# Patient Record
Sex: Female | Born: 1965 | Race: White | Hispanic: No | Marital: Single
Health system: Southern US, Academic
[De-identification: ages and names within clinical notes are randomized; demographics above are authoritative.]

## PROBLEM LIST (undated history)

## (undated) ENCOUNTER — Encounter

## (undated) ENCOUNTER — Ambulatory Visit

## (undated) DIAGNOSIS — G43109 Migraine with aura, not intractable, without status migrainosus: Secondary | ICD-10-CM

## (undated) DIAGNOSIS — D649 Anemia, unspecified: Secondary | ICD-10-CM

## (undated) DIAGNOSIS — M1990 Unspecified osteoarthritis, unspecified site: Secondary | ICD-10-CM

## (undated) DIAGNOSIS — J45909 Unspecified asthma, uncomplicated: Secondary | ICD-10-CM

## (undated) DIAGNOSIS — K219 Gastro-esophageal reflux disease without esophagitis: Secondary | ICD-10-CM

## (undated) DIAGNOSIS — T7840XA Allergy, unspecified, initial encounter: Secondary | ICD-10-CM

## (undated) DIAGNOSIS — Z87898 Personal history of other specified conditions: Secondary | ICD-10-CM

## (undated) DIAGNOSIS — Q796 Ehlers-Danlos syndrome, unspecified: Secondary | ICD-10-CM

## (undated) HISTORY — PX: SPINE SURGERY: SHX786

## (undated) HISTORY — DX: Anemia, unspecified: D64.9

## (undated) HISTORY — DX: Migraine with aura, not intractable, without status migrainosus: G43.109

## (undated) HISTORY — PX: UTERINE FIBROID SURGERY: SHX826

## (undated) HISTORY — DX: Gastro-esophageal reflux disease without esophagitis: K21.9

## (undated) HISTORY — DX: Unspecified asthma, uncomplicated: J45.909

## (undated) HISTORY — DX: Allergy, unspecified, initial encounter: T78.40XA

## (undated) HISTORY — DX: Personal history of other specified conditions: Z87.898

## (undated) HISTORY — DX: Ehlers-Danlos syndrome, unspecified: Q79.60

## (undated) HISTORY — DX: Unspecified osteoarthritis, unspecified site: M19.90

---

## 1998-04-17 ENCOUNTER — Encounter: Payer: Self-pay | Admitting: Internal Medicine

## 1998-04-17 ENCOUNTER — Ambulatory Visit (HOSPITAL_COMMUNITY): Admission: RE | Admit: 1998-04-17 | Discharge: 1998-04-17 | Payer: Self-pay | Admitting: Internal Medicine

## 1998-08-31 ENCOUNTER — Encounter: Payer: Self-pay | Admitting: Neurosurgery

## 1998-08-31 ENCOUNTER — Ambulatory Visit (HOSPITAL_COMMUNITY): Admission: RE | Admit: 1998-08-31 | Discharge: 1998-08-31 | Payer: Self-pay | Admitting: Neurosurgery

## 2000-07-01 ENCOUNTER — Ambulatory Visit (HOSPITAL_COMMUNITY): Admission: RE | Admit: 2000-07-01 | Discharge: 2000-07-01 | Payer: Self-pay | Admitting: Neurosurgery

## 2000-07-01 ENCOUNTER — Encounter: Payer: Self-pay | Admitting: Neurosurgery

## 2000-08-25 ENCOUNTER — Ambulatory Visit (HOSPITAL_COMMUNITY): Admission: RE | Admit: 2000-08-25 | Discharge: 2000-08-25 | Payer: Self-pay | Admitting: Internal Medicine

## 2000-08-25 ENCOUNTER — Encounter: Payer: Self-pay | Admitting: Internal Medicine

## 2000-08-25 ENCOUNTER — Encounter: Admission: RE | Admit: 2000-08-25 | Discharge: 2000-08-25 | Payer: Self-pay | Admitting: Internal Medicine

## 2000-09-05 ENCOUNTER — Encounter: Admission: RE | Admit: 2000-09-05 | Discharge: 2000-09-05 | Payer: Self-pay | Admitting: Internal Medicine

## 2000-10-05 ENCOUNTER — Encounter: Admission: RE | Admit: 2000-10-05 | Discharge: 2000-10-05 | Payer: Self-pay | Admitting: Internal Medicine

## 2000-11-07 ENCOUNTER — Encounter: Admission: RE | Admit: 2000-11-07 | Discharge: 2000-11-07 | Payer: Self-pay | Admitting: Internal Medicine

## 2001-12-21 ENCOUNTER — Encounter: Payer: Self-pay | Admitting: Family Medicine

## 2001-12-21 ENCOUNTER — Ambulatory Visit (HOSPITAL_COMMUNITY): Admission: RE | Admit: 2001-12-21 | Discharge: 2001-12-21 | Payer: Self-pay | Admitting: Family Medicine

## 2002-02-26 ENCOUNTER — Encounter: Admission: RE | Admit: 2002-02-26 | Discharge: 2002-02-26 | Payer: Self-pay | Admitting: Internal Medicine

## 2004-02-23 ENCOUNTER — Ambulatory Visit (HOSPITAL_COMMUNITY): Admission: RE | Admit: 2004-02-23 | Discharge: 2004-02-23 | Payer: Self-pay | Admitting: Gastroenterology

## 2004-09-06 ENCOUNTER — Ambulatory Visit (HOSPITAL_COMMUNITY): Admission: RE | Admit: 2004-09-06 | Discharge: 2004-09-06 | Payer: Self-pay | Admitting: Neurosurgery

## 2007-01-18 HISTORY — PX: SHOULDER SURGERY: SHX246

## 2007-03-27 ENCOUNTER — Encounter: Admission: RE | Admit: 2007-03-27 | Discharge: 2007-03-27 | Payer: Self-pay | Admitting: Gastroenterology

## 2007-06-10 ENCOUNTER — Encounter: Admission: RE | Admit: 2007-06-10 | Discharge: 2007-06-10 | Payer: Self-pay | Admitting: Neurosurgery

## 2009-06-05 ENCOUNTER — Encounter: Admission: RE | Admit: 2009-06-05 | Discharge: 2009-06-05 | Payer: Self-pay | Admitting: Neurosurgery

## 2010-01-17 HISTORY — PX: KNEE SURGERY: SHX244

## 2010-06-04 NOTE — Op Note (Signed)
NAME:  Marissa [name], Marissa [name]                  ACCOUNT NO.:  [id number]   MEDICAL RECORD NO.:  [id number]          PATIENT TYPE:  AMB   LOCATION:  ENDO                         FACILITY:  [facility]   PHYSICIAN:  Anselmo Rod, M.D.  DATE OF BIRTH:  03-Mar-1965   DATE OF PROCEDURE:  02/23/2004  DATE OF DISCHARGE:                                 OPERATIVE REPORT   PROCEDURE PERFORMED:  Screening colonoscopy.   ENDOSCOPIST:  Charna Elizabeth, M.D.   INSTRUMENT USED:  Olympus video colonoscope.   INDICATIONS FOR PROCEDURE:  A 45 year old white female with a history of  colonic polyps in her mother and colon cancer in her grandmother, undergoing  screening colonoscopy to rule out colonic polyps, masses, etc. 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   PREPROCEDURE PREPARATION:  Informed consent was procured from the patient.  The patient was fasted for eight hours prior to the procedure and prepped  with a bottle of magnesium citrate and a gallon of GoLYTELY the night prior  to the procedure.  The risks and benefits of the procedure including a 10%  miss rate for colon polyps or cancers was discussed with the patient as  well.   PREPROCEDURE PHYSICAL:  The patient had stable vital signs.  Neck supple.  Chest clear to auscultation.  S1 and S2 regular.  Abdomen soft with normal  bowel sounds.   DESCRIPTION OF PROCEDURE:  The patient was placed in left lateral decubitus  position and sedated with 70 mg of Demerol and 7 mg of Versed in slow  incremental doses.  Once the patient was adequately sedated and maintained  on low flow oxygen and continuous cardiac monitoring, the Olympus video  colonoscope was advanced from the rectum to the cecum.  The appendicular  orifice and ileocecal valve were clearly visualized and photographed.  The  terminal ileum appeared healthy and without lesions.  No masses, polyps,  erosions, ulcerations or diverticula were seen.  Retroflexion in the rectum  revealed no  abnormalities.   IMPRESSION:  Normal colonoscopy up to terminal ileum.  No masses or polyps  seen.  No evidence of diverticulosis.   RECOMMENDATIONS:  1.  Continue high fiber diet with liberal fluid intake.  2.  Repeat colonoscopy is recommended in the next five years unless the      patient develops any abnormal symptoms in the interim.  3.  Outpatient followup as need arises in the future.      JNM/MEDQ  D:  02/23/2004  T:  02/23/2004  Job:  [id number]   cc:   Nancy Marus __________, MD

## 2010-08-16 ENCOUNTER — Other Ambulatory Visit: Payer: Self-pay | Admitting: Family Medicine

## 2010-08-16 DIAGNOSIS — M542 Cervicalgia: Secondary | ICD-10-CM

## 2010-08-18 ENCOUNTER — Ambulatory Visit
Admission: RE | Admit: 2010-08-18 | Discharge: 2010-08-18 | Disposition: A | Discharge status: Home / Self Care | Payer: BC Managed Care – PPO | Source: Ambulatory Visit | Attending: Family Medicine | Admitting: Family Medicine

## 2010-08-18 DIAGNOSIS — M542 Cervicalgia: Secondary | ICD-10-CM

## 2010-08-18 MED ORDER — GADOBENATE DIMEGLUMINE 529 MG/ML IV SOLN
10.0000 mL | Freq: Once | INTRAVENOUS | Status: AC | PRN
  Administered 2010-08-18: 10 mL via INTRAVENOUS

## 2010-08-20 ENCOUNTER — Other Ambulatory Visit: Payer: Self-pay

## 2011-02-07 ENCOUNTER — Other Ambulatory Visit (HOSPITAL_COMMUNITY): Payer: Self-pay | Admitting: Family Medicine

## 2011-02-15 ENCOUNTER — Encounter (HOSPITAL_COMMUNITY)
Admission: RE | Admit: 2011-02-15 | Discharge: 2011-02-15 | Disposition: A | Discharge status: Home / Self Care | Payer: BC Managed Care – PPO | Source: Ambulatory Visit | Attending: Family Medicine | Admitting: Family Medicine

## 2011-02-15 DIAGNOSIS — R109 Unspecified abdominal pain: Secondary | ICD-10-CM | POA: Insufficient documentation

## 2011-02-15 MED ORDER — SINCALIDE 5 MCG IJ SOLR
INTRAMUSCULAR | Status: AC
  Administered 2011-02-15: 0.94 ug via INTRAVENOUS
  Filled 2011-02-15: qty 5

## 2011-02-15 MED ORDER — TECHNETIUM TC 99M MEBROFENIN IV KIT
5.0000 | PACK | Freq: Once | INTRAVENOUS | Status: AC | PRN
  Administered 2011-02-15: 5 via INTRAVENOUS

## 2011-06-23 ENCOUNTER — Other Ambulatory Visit: Payer: Self-pay | Admitting: Neurosurgery

## 2011-06-23 DIAGNOSIS — D352 Benign neoplasm of pituitary gland: Secondary | ICD-10-CM

## 2011-06-23 DIAGNOSIS — D353 Benign neoplasm of craniopharyngeal duct: Secondary | ICD-10-CM

## 2011-07-08 ENCOUNTER — Ambulatory Visit
Admission: RE | Admit: 2011-07-08 | Discharge: 2011-07-08 | Disposition: A | Discharge status: Home / Self Care | Payer: BC Managed Care – PPO | Source: Ambulatory Visit | Attending: Neurosurgery | Admitting: Neurosurgery

## 2011-07-08 DIAGNOSIS — D352 Benign neoplasm of pituitary gland: Secondary | ICD-10-CM

## 2011-07-08 DIAGNOSIS — D353 Benign neoplasm of craniopharyngeal duct: Secondary | ICD-10-CM

## 2011-07-08 MED ORDER — GADOBENATE DIMEGLUMINE 529 MG/ML IV SOLN
5.0000 mL | Freq: Once | INTRAVENOUS | Status: AC | PRN
  Administered 2011-07-08: 5 mL via INTRAVENOUS

## 2012-02-10 ENCOUNTER — Other Ambulatory Visit: Payer: Self-pay | Admitting: Neurosurgery

## 2012-02-10 DIAGNOSIS — D352 Benign neoplasm of pituitary gland: Secondary | ICD-10-CM

## 2012-02-12 ENCOUNTER — Ambulatory Visit
Admission: RE | Admit: 2012-02-12 | Discharge: 2012-02-12 | Disposition: A | Discharge status: Home / Self Care | Payer: BC Managed Care – PPO | Source: Ambulatory Visit | Attending: Neurosurgery | Admitting: Neurosurgery

## 2012-02-12 DIAGNOSIS — D352 Benign neoplasm of pituitary gland: Secondary | ICD-10-CM

## 2012-02-12 DIAGNOSIS — D353 Benign neoplasm of craniopharyngeal duct: Secondary | ICD-10-CM

## 2012-02-12 MED ORDER — GADOBENATE DIMEGLUMINE 529 MG/ML IV SOLN
5.0000 mL | Freq: Once | INTRAVENOUS | Status: AC | PRN
  Administered 2012-02-12: 5 mL via INTRAVENOUS

## 2012-04-19 ENCOUNTER — Encounter: Payer: Self-pay | Admitting: Neurology

## 2012-04-19 ENCOUNTER — Ambulatory Visit (INDEPENDENT_AMBULATORY_CARE_PROVIDER_SITE_OTHER): Payer: BC Managed Care – PPO | Admitting: Neurology

## 2012-04-19 VITALS — BP 115/72 | HR 86 | Ht 59.5 in | Wt 112.0 lb

## 2012-04-19 DIAGNOSIS — D353 Benign neoplasm of craniopharyngeal duct: Secondary | ICD-10-CM

## 2012-04-19 DIAGNOSIS — H539 Unspecified visual disturbance: Secondary | ICD-10-CM

## 2012-04-19 DIAGNOSIS — D352 Benign neoplasm of pituitary gland: Secondary | ICD-10-CM

## 2012-04-19 MED ORDER — VERAPAMIL HCL ER 120 MG PO CP24: 120.0000 mg | ORAL_CAPSULE | Freq: Every day | ORAL | Status: DC

## 2012-04-19 NOTE — Patient Instructions (Signed)
   We will check a MRA of the head and a VER test for the eye nerves. See Dr. Hazle Quant for the eye exam. 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

## 2012-04-19 NOTE — Progress Notes (Signed)
Reason for visit: Visual change  Marissa [name] is a 47 y.o. female  History of present illness:  Marissa [name] is a 47 year old left-handed white female with a history of a pituitary macroadenoma. The patient has been followed for number of years for this tumor, and she has had a recent MRI the brain that shows good stability, with minimal suprasellar extension. 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 The patient has been treating her events with the use of caffeine. The episodes have been relatively infrequent over the past, occurring once every 3-6 months. The episodes of visual change are unassociated with headache, and may last usually about 1 hour. The patient however, has had an increase in frequency of her visual complaints over the last 2 months. The patient has bright flashing lights that come on and last about 15 or 20 minutes, and then disappear. In January 2014, the patient began noting onset of a visual disturbance that was affected on the left eye only, respecting the vertical plane, in the temporal field. This has been persistent without alteration since that time. The patient denies any vision complaints involving the right eye whatsoever. The patient has had no headache, and she has had no numbness or weakness of the face, arms, or legs. The patient denies problems controlling the bowels or the bladder. The patient has undergone MRI evaluation that does not show any brain abnormalities, and once again the pituitary adenoma appears to be stable. The patient is sent to this office for an evaluation. The patient has a history of Ehlers-Danlos syndrome.  Past Medical History  Diagnosis Date  . History of pituitary tumor   . Ehlers-Danlos syndrome   . Ocular migraine     Past Surgical History  Procedure Laterality Date  . Shoulder surgery N/A 2009  . Knee surgery N/A 2012  . Uterine fibroid surgery N/A     Family History  Problem  Relation Age of Onset  . Arthritis Mother   . Arthritis Father   . Diabetes Father     Social history:  reports that she has never smoked. She does not have any smokeless tobacco history on file. She reports that she does not drink alcohol or use illicit drugs.  Medications:  No current outpatient prescriptions on file prior to visit.   No current facility-administered medications on file prior to visit.    Allergies: No Known Allergies  ROS:  Out of a complete 14 system review of symptoms, the patient complains only of the following symptoms, and all other reviewed systems are negative.  Vision changes Fatigue Palpitations Visual disturbance Flushing Joint pain Headache  Blood pressure 115/72, pulse 86, height 4' 11.5" (1.511 m), weight 112 lb (50.803 kg).  Physical Exam  General: The patient is alert and cooperative at the time of the examination.  Head: Pupils are equal, round, and reactive to light. Discs are flat bilaterally.  Neck: The neck is supple, no carotid bruits are noted.  Respiratory: The respiratory examination is clear.  Cardiovascular: The cardiovascular examination reveals a regular rate and rhythm, no obvious murmurs or rubs are noted.  Skin: Extremities are without significant edema.  Neurologic Exam  Mental status:  Cranial nerves: Facial symmetry is present. There is good sensation of the face to pinprick and soft touch bilaterally. The strength of the facial muscles and the muscles to head turning and shoulder shrug are normal bilaterally. Speech is well enunciated,  no aphasia or dysarthria is noted. Extraocular movements are full. Visual fields are full. The patient notes a mild visual change in the left eye only, inferior temporal quadrant.  Motor: The motor testing reveals 5 over 5 strength of all 4 extremities. Good symmetric motor tone is noted throughout.  Sensory: Sensory testing is intact to pinprick, soft touch, vibration sensation,  and position sense on all 4 extremities. No evidence of extinction is noted.  Coordination: Cerebellar testing reveals good finger-nose-finger and heel-to-shin bilaterally.  Gait and station: Gait is normal. Tandem gait is normal. Romberg is negative. No drift is seen  Reflexes: Deep tendon reflexes are symmetric and normal bilaterally. Toes are downgoing bilaterally.   Assessment/Plan:  1. Ophthalmic migraine  2. Pituitary macroadenoma  3. Left temporal visual field deficit, OS  The patient has 2 different types of visual field phenomenon. The patient appears to have a fixed visual blurring that mainly affects the left eye temporal inferior quadrant. The patient also has positive visual phenomenon with bright flashing lights that are most consistent with a migraine phenomenon. It is not clear that the 2 problems are completely related to one another. The patient does have a pituitary macroadenoma, and this appears to be stable by MRI. We will need to consider the possibility of minor pressure on the optic nerve chiasm, just starting to create some visual field phenomenon. The patient will be set up for a visual evoked response test. The patient will have a MRA of the head, and a carotid Doppler study given the history of Ehlers-Danlos syndrome. The patient will have an ophthalmologic evaluation through Dr. Hazle Quant to document any visual field deficits that can be followed in the future. The patient will followup in about 2 months.  Marlan Palau MD 04/19/2012 7:28 PM  [facility] [address] [place], [place] [address]  Phone [phone] Fax [phone]

## 2012-04-26 ENCOUNTER — Other Ambulatory Visit (INDEPENDENT_AMBULATORY_CARE_PROVIDER_SITE_OTHER): Payer: BC Managed Care – PPO

## 2012-04-26 DIAGNOSIS — H539 Unspecified visual disturbance: Secondary | ICD-10-CM

## 2012-04-26 DIAGNOSIS — D352 Benign neoplasm of pituitary gland: Secondary | ICD-10-CM

## 2012-05-03 ENCOUNTER — Ambulatory Visit (INDEPENDENT_AMBULATORY_CARE_PROVIDER_SITE_OTHER): Payer: BC Managed Care – PPO

## 2012-05-03 DIAGNOSIS — D352 Benign neoplasm of pituitary gland: Secondary | ICD-10-CM

## 2012-05-03 DIAGNOSIS — H539 Unspecified visual disturbance: Secondary | ICD-10-CM

## 2012-05-03 DIAGNOSIS — H5310 Unspecified subjective visual disturbances: Secondary | ICD-10-CM

## 2012-05-08 ENCOUNTER — Telehealth: Payer: Self-pay | Admitting: Neurology

## 2012-05-08 NOTE — Telephone Encounter (Signed)
I called patient. The carotid Doppler study was unremarkable. The visual evoked response test is pending, it should be done tomorrow. If this is abnormal, or further patient back to Dr. Newell Coral with the assumption that the pituitary tumor is in fact resulting in some compression of the optic nerve chiasm. The MRA of the head was unremarkable. I discussed this with the patient.

## 2012-05-09 ENCOUNTER — Ambulatory Visit (INDEPENDENT_AMBULATORY_CARE_PROVIDER_SITE_OTHER): Payer: BC Managed Care – PPO

## 2012-05-09 ENCOUNTER — Telehealth: Payer: Self-pay | Admitting: Neurology

## 2012-05-09 DIAGNOSIS — H539 Unspecified visual disturbance: Secondary | ICD-10-CM

## 2012-05-09 DIAGNOSIS — D352 Benign neoplasm of pituitary gland: Secondary | ICD-10-CM

## 2012-05-09 DIAGNOSIS — H5310 Unspecified subjective visual disturbances: Secondary | ICD-10-CM

## 2012-05-09 NOTE — Procedures (Signed)
History:   Marissa [name] is a 47 year old patient with a history of ophthalmic migraine for many years.  The patient has recently had an increase in frequency of the scintillating scotoma, but she has had a fixed temporal visual deficit involving the left eye only over the last 2 months. The patient is being evaluated for possible optic nerve or optic chiasm injury. The patient has a history of a pituitary macroadenoma.   Description: The visual evoked response test was performed today using 32 x 32 check sizes. The absolute latencies for the N1 and the P100 wave forms were within normal limits bilaterally. The amplitudes for the P100 wave forms were also within normal limits bilaterally. The visual acuity was not recorded.  Impression:  The visual evoked response test above was within normal limits bilaterally. No evidence of conduction slowing was seen within the anterior visual pathways on either side on today's evaluation.

## 2012-05-09 NOTE — Telephone Encounter (Signed)
I called patient. The visual evoked response test was normal. The patient is to followup with Dr. Hazle Quant for formal visual field testing. This will need to be followed over time, repeated in the next 2 or 3 months. If any change in this is noted, surgical decompression of the pituitary macroadenoma should be considered. If the ophthalmic migraine phenomenon are still frequent, the patient may need to go up on the verapamil dosing, she is to contact me if this is the case. The MRI the brain done previously did not show any enlargement of the pituitary macroadenoma.

## 2012-09-18 ENCOUNTER — Ambulatory Visit: Payer: BC Managed Care – PPO | Admitting: Neurology

## 2012-11-08 ENCOUNTER — Ambulatory Visit (INDEPENDENT_AMBULATORY_CARE_PROVIDER_SITE_OTHER): Payer: No Typology Code available for payment source | Admitting: Sports Medicine

## 2012-11-08 ENCOUNTER — Encounter: Payer: Self-pay | Admitting: Sports Medicine

## 2012-11-08 VITALS — BP 130/85 | Ht <= 58 in | Wt 105.0 lb

## 2012-11-08 DIAGNOSIS — M25559 Pain in unspecified hip: Secondary | ICD-10-CM

## 2012-11-08 DIAGNOSIS — M542 Cervicalgia: Secondary | ICD-10-CM

## 2012-11-08 DIAGNOSIS — Q796 Ehlers-Danlos syndrome, unspecified: Secondary | ICD-10-CM | POA: Insufficient documentation

## 2012-11-08 DIAGNOSIS — Q7963 Vascular Ehlers-Danlos syndrome: Secondary | ICD-10-CM

## 2012-11-08 DIAGNOSIS — M25551 Pain in right hip: Secondary | ICD-10-CM

## 2012-11-08 NOTE — Patient Instructions (Signed)
  Isometric neck exercises  Shoulder exercises: Back supported bicep curls and flys (internal rotation starting at 45 degrees) Wall push ups.  Wear soft cervical collar for one hour per day.  Continue to walk every other day and balance that with exercise bike on the other days.  Hip exercises: 45 degree standing rotation exercises Hip abduction exercises Extension exercises to 30 degrees back while keeping pelvis stable  Try to do one set of each daily

## 2012-11-08 NOTE — Progress Notes (Signed)
Patient ID: Marissa [name], female   DOB: May 23, 1965, 47 y.o.   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 She presents today for consultation/evaluation of hypermobility issues secondary to recurrently dislocating and subluxing bilateral shoulders, right hip and SI joint. She has chronic muscular pain around these joints.    Currently taking amitriptyline and Celebrex. This has not alleviated her pain, however, it is helping her sleep a little bit more.  She was walking approximately 3 miles a day in roughly 45 minutes however, this is decreased to every other day secondary to hip pain.  Past Medical History  Diagnosis Date  . History of pituitary tumor   . Ehlers-Danlos syndrome   . Ocular migraine    Past Surgical History  Procedure Laterality Date  . Shoulder surgery N/A 2009  . Knee surgery N/A 2012  . Uterine fibroid surgery N/A    No Known Allergies  Please see associated documentation from specialty visits for further history and diagnostic testing to date.  ROS:  As per HPI otherwise negative.  Examination: BP 130/85  Ht 4\' 10"  (1.473 m)  Wt 105 lb (47.628 kg)  BMI 21.95 kg/m2 This is a well-developed well-nourished 47 year old female awake alert and oriented in no acute distress  Eyes:  Bluish sclera  Beighton score of 7   MSK examination:   Findings consistent with hypermobility at the cervico occipital junction.  In addition there is evidence of significant trapezius muscle spasm.  Bilateral shoulders: Sulcus sign noted bilaterally Hypermobile range of motion Evidence of surgical scar on the left shoulder.  SI joint: Noted to be tighter on the right than the left  Bilateral hips: Pearlean Brownie testing consistent with hypermobility bilaterally Rt hip range of motion 150, left hip range of motion 160 with evidence of subluxation on RT Hip  strength 5 over 5 bilaterally however, RT hip much stronger to abduction .  Bilateral feet: Arches are intact bilaterally with calcaneal varus.  Toe splaying between the first and second ray bilaterally with bilateral bunionette formation

## 2012-11-08 NOTE — Assessment & Plan Note (Addendum)
Patient encouraged to do isometric cervical muscle strengthening and for a soft collar for one hour a day. Anterior chest wall exercises given instructions to do these with the back supported. Hip strengthening exercises also given. In addition she was encouraged to continue walking every other day to admit the exercise bike on opposite days. Thigh Body Helix compression sleeve given and recommendation to wear compressive shorts. She'll followup in several months for reevaluation and to assess progress

## 2012-11-22 ENCOUNTER — Other Ambulatory Visit: Payer: Self-pay

## 2012-12-05 ENCOUNTER — Encounter: Payer: Self-pay | Admitting: Sports Medicine

## 2013-01-02 ENCOUNTER — Encounter: Payer: Self-pay | Admitting: Podiatry

## 2013-01-02 ENCOUNTER — Ambulatory Visit (INDEPENDENT_AMBULATORY_CARE_PROVIDER_SITE_OTHER): Payer: No Typology Code available for payment source

## 2013-01-02 ENCOUNTER — Ambulatory Visit (INDEPENDENT_AMBULATORY_CARE_PROVIDER_SITE_OTHER): Payer: No Typology Code available for payment source | Admitting: Podiatry

## 2013-01-02 VITALS — BP 132/79 | HR 101 | Resp 16 | Ht <= 58 in | Wt 105.0 lb

## 2013-01-02 DIAGNOSIS — M779 Enthesopathy, unspecified: Secondary | ICD-10-CM

## 2013-01-02 DIAGNOSIS — M21619 Bunion of unspecified foot: Secondary | ICD-10-CM

## 2013-01-02 DIAGNOSIS — M79671 Pain in right foot: Secondary | ICD-10-CM

## 2013-01-02 DIAGNOSIS — M79609 Pain in unspecified limb: Secondary | ICD-10-CM

## 2013-01-02 DIAGNOSIS — M2010 Hallux valgus (acquired), unspecified foot: Secondary | ICD-10-CM

## 2013-01-02 MED ORDER — TRIAMCINOLONE ACETONIDE 10 MG/ML IJ SUSP
10.0000 mg | Freq: Once | INTRAMUSCULAR | Status: AC
  Administered 2013-01-02: 10 mg

## 2013-01-02 NOTE — Progress Notes (Signed)
   Subjective:    Patient ID: Marissa [name], female    DOB: [birth year], 47 y.o.   MRN: [id number]  HPI Comments: "I've got this bump on the side of my foot"  Pt c/o of lateral 5th MPJ right that has been painful recently and now has started to open up and drain. Very swollen and gets red. Has gradually gotten larger over time. Shoes and any pressure is extremely uncomfortable. Using neosporin and bandage over it and ices occasionally.      Review of Systems  Musculoskeletal: Positive for arthralgias and myalgias.  Skin:       Open sores  All other systems reviewed and are negative.       Objective:   Physical Exam        Assessment & Plan:

## 2013-01-03 NOTE — Progress Notes (Signed)
Subjective:     Patient ID: Marissa [name], female   DOB: September 21, 1965, 47 y.o.   MRN: [id number]  Foot Pain   patient presents with pain in the right fifth metatarsal phalangeal joint of about 2 months it has gotten worse over that time and she did note a small amount of clear fluid a couple weeks ago with  pain associated with the deformity. The left also shows deformity but not with the discomfort of the right  Review of Systems  All other systems reviewed and are negative.       Objective:   Physical Exam  Nursing note and vitals reviewed. Constitutional: She is oriented to person, place, and time.  Cardiovascular: Intact distal pulses.   Musculoskeletal: Normal range of motion.  Neurological: She is oriented to person, place, and time.  Skin: Skin is warm.   neurovascular status intact with muscle strength adequate and no equinus condition noted. Patient is found to have inflammation of the fifth MPJ right over left with slight discoloration of the skin with no drainage no erythema no proximal edema calor or lymph node distention. It is painful when pressed     Assessment:     Inflamed fifth MPJ right secondary to some type of activity or possible shoe gear usage    Plan:     H&P and x-rays reviewed and condition and explained to patient. At this time I did a careful injection around the bursal sac and inflamed capsule two milligrams Kenalog 3 mg Xylocaine and advised on soaks and if any redness swelling or other change should occur to let me know immediately if not see back in 3 weeks

## 2013-01-07 ENCOUNTER — Ambulatory Visit (INDEPENDENT_AMBULATORY_CARE_PROVIDER_SITE_OTHER): Payer: No Typology Code available for payment source | Admitting: Sports Medicine

## 2013-01-07 ENCOUNTER — Encounter: Payer: Self-pay | Admitting: Sports Medicine

## 2013-01-07 VITALS — BP 112/74 | Ht 58.5 in | Wt 105.0 lb

## 2013-01-07 DIAGNOSIS — M21629 Bunionette of unspecified foot: Secondary | ICD-10-CM | POA: Insufficient documentation

## 2013-01-07 DIAGNOSIS — M21619 Bunion of unspecified foot: Secondary | ICD-10-CM

## 2013-01-07 DIAGNOSIS — Q7962 Hypermobile Ehlers-Danlos syndrome: Secondary | ICD-10-CM

## 2013-01-07 DIAGNOSIS — Q796 Ehlers-Danlos syndrome, unspecified: Secondary | ICD-10-CM

## 2013-01-07 DIAGNOSIS — M205X9 Other deformities of toe(s) (acquired), unspecified foot: Secondary | ICD-10-CM

## 2013-01-07 NOTE — Assessment & Plan Note (Signed)
Since last visit she developed a bursal swelling over the right fifth MTP  This has been injected  I want her to follow this but if symptoms recur I think we should have a joint aspiration to be sure she does not have CPPD or gout

## 2013-01-07 NOTE — Patient Instructions (Signed)
Neck - keep up isometrics and periodic collar  For trap spasm - do 5 to 10 quick arm swings frequently - then prop up arms for support  For the SI joint - do a lot of pelvic tilts - strategy is to do 5 x count of 5 or 5 breaths  Adjust hip to standing or isometric - inner muscle  Adductors do resistance x table For abductors outer mm - purely isometric  Monitor feet - we can try inserts in certain shoes if needed  Try warm foot bath in evening for bunionette

## 2013-01-07 NOTE — Progress Notes (Signed)
Patient ID: Marissa [name], female   DOB: Aug 21, 1965, 47 y.o.   MRN: [id number]  Patient with Ehlers-Danlos syndrome type III She has significant hypermobility and has had some chronic right hip pain About 2 weeks ago while getting out of car the left knee seemed to come out of position and it took about 15 minutes to get back into place  This was very painful and has persisted in causing pain  New issue since last visit is that the bunionette that we noticed seemed to develop a bursal swelling and was injected by Dr. Dellia Nims on the right foot This is still painful except in very wide shoes  Other issues Neck has improved somewhat by using collar and doing isometric exercises  Chronic bilateral trapezius spasm which is helped somewhat with massage  Right arm pain just above the elbow without specific injury  Knees tend to sublux easily at the patella  Physical examination  No acute distress  Patient has generalized hypermobility of joints  Right hip today has 150 of rotational motion FADIR Creates pain in the groin Good strength No subluxation noted today  Left hip shows about 140 of motion which is a reduction Motion is slightly painful Faddir Causes sharp groin pain  Trapezius bilaterally show spasm  Right elbow and arm area are not remarkable to direct testing

## 2013-01-07 NOTE — Assessment & Plan Note (Signed)
She continues to have many joint related symptoms  We will try to give her exercises to help stabilize the joints  She may want to try compression sleeves if she finds that they provide support  I gave her some specific exercises and will recheck her again in about 3 months

## 2013-01-16 ENCOUNTER — Ambulatory Visit (INDEPENDENT_AMBULATORY_CARE_PROVIDER_SITE_OTHER): Payer: No Typology Code available for payment source | Admitting: Podiatry

## 2013-01-16 ENCOUNTER — Encounter: Payer: Self-pay | Admitting: Podiatry

## 2013-01-16 VITALS — BP 123/70 | HR 95 | Resp 16

## 2013-01-16 DIAGNOSIS — M2010 Hallux valgus (acquired), unspecified foot: Secondary | ICD-10-CM

## 2013-01-16 DIAGNOSIS — M21619 Bunion of unspecified foot: Secondary | ICD-10-CM

## 2013-01-16 NOTE — Progress Notes (Signed)
Subjective:     Patient ID: Marissa [name], female   DOB: [birth year], 47 y.o.   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 May have some involvement with Ehlers-Danlos disease but most likely structural in nature    Plan:     Reviewed condition and failure to respond to cortisone injection. Discussed open toed shoe gear which the patient does do and ultimately that this will require metatarsal osteotomy with screw fixation. Patient is going to review her options and decide what would be best but ultimately I do think that surgery will be in her best interest right foot

## 2013-02-13 ENCOUNTER — Ambulatory Visit (INDEPENDENT_AMBULATORY_CARE_PROVIDER_SITE_OTHER): Payer: No Typology Code available for payment source | Admitting: Sports Medicine

## 2013-02-13 ENCOUNTER — Encounter: Payer: Self-pay | Admitting: Sports Medicine

## 2013-02-13 VITALS — BP 124/80 | HR 99 | Ht 58.5 in | Wt 105.0 lb

## 2013-02-13 DIAGNOSIS — M21629 Bunionette of unspecified foot: Secondary | ICD-10-CM

## 2013-02-13 DIAGNOSIS — M25529 Pain in unspecified elbow: Secondary | ICD-10-CM

## 2013-02-13 DIAGNOSIS — Q7962 Hypermobile Ehlers-Danlos syndrome: Secondary | ICD-10-CM

## 2013-02-13 DIAGNOSIS — M21619 Bunion of unspecified foot: Secondary | ICD-10-CM

## 2013-02-13 DIAGNOSIS — M25521 Pain in right elbow: Secondary | ICD-10-CM

## 2013-02-13 DIAGNOSIS — Q796 Ehlers-Danlos syndrome, unspecified: Secondary | ICD-10-CM

## 2013-02-13 MED ORDER — AMITRIPTYLINE HCL 10 MG PO TABS: 10.0000 mg | ORAL_TABLET | Freq: Every day | ORAL | Status: DC

## 2013-02-13 MED ORDER — TRAMADOL HCL 50 MG PO TABS: 50.0000 mg | ORAL_TABLET | Freq: Four times a day (QID) | ORAL | Status: DC | PRN

## 2013-02-13 NOTE — Progress Notes (Signed)
Patient ID: Marissa [name], female   DOB: March 19, 1965, 48 y.o.   MRN: [id number]  For evaluation for Ehlers-Danlos syndrome. She has significant right arm and elbow pain at this time. It awakens her 3-4 times at night. Most day she has fairly significant pain in one of several joints. She was recently switched from Celebrex to Mobic and wonders if that is making the pain worse. However even before switching she was having significant pain.  She recently was evaluated for bunionette by Dr. Paulla Dolly. He surgery but she really does not want to go through surgery again as she did not heal well from prior surgeries.  Neck and trapezius tightness can be a problem as well  Physical examination Alert and in no acute distress She does appear frustrated  Examination of neck reveals she has full range of motion without any worsening pain She does have increased anterior to posterior translation at the upper cervical spine Spurling test does not cause any radicular symptoms  Right shoulder has a positive sulcus sign and increased mobility Shoulder testing does not recreate pain  Right elbow shows full range of motion but she has tenderness at the lateral epicondyle and some over the triceps tendon She does not show much hyperextension of the right elbow  Feet show significant collapse of the transverse arch 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

## 2013-02-13 NOTE — Assessment & Plan Note (Signed)
Since she does not wish to have surgery we are trying to pad the bunionette so they don't hurt in her shoes  Will make her orthotics with some transverse arch support

## 2013-02-13 NOTE — Assessment & Plan Note (Signed)
I suspect she has chronic tenderness strain from her mobility issues  She should use her elbow compression more consistently  Given a home exercise program

## 2013-02-13 NOTE — Assessment & Plan Note (Signed)
Because of pain in multiple joints we'll start her on some tramadol  Increase her amitriptyline as she is only taking 5 mg  Continue the Mobic

## 2013-02-13 NOTE — Patient Instructions (Signed)
Use light weights to do wrist rolls and wrist curls with palm facing down  Start taking tramadol as needed- start with taking 1/2 half tablet   Increase amitriptyline to 10 mg at bedtime  Use compression sleeve more frequently  Find shoes with wide toe box   Scheduled follow up appt for custom orthotics  Thank you for seeing [place] today!

## 2013-04-09 ENCOUNTER — Encounter: Payer: Self-pay | Admitting: Sports Medicine

## 2013-04-09 ENCOUNTER — Ambulatory Visit (INDEPENDENT_AMBULATORY_CARE_PROVIDER_SITE_OTHER): Payer: No Typology Code available for payment source | Admitting: Sports Medicine

## 2013-04-09 VITALS — BP 123/80 | HR 93 | Ht 58.5 in | Wt 105.0 lb

## 2013-04-09 DIAGNOSIS — M21619 Bunion of unspecified foot: Secondary | ICD-10-CM

## 2013-04-09 DIAGNOSIS — M21629 Bunionette of unspecified foot: Secondary | ICD-10-CM

## 2013-04-09 NOTE — Progress Notes (Signed)
   Subjective:    Patient ID: Marissa [name], female    DOB: 1965/12/06, 48 y.o.   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 She has bilateral bunionettes. The left has been present for a long time and was previously injected on the right is now progressively getting worse.   She was previously seen for elbow pain which has not gotten better. Pain is worse with twisting motion of the hand, bent position, or any prolonged period without movement. She has been doing exercises and wearing a sleeve but has not noticed a difference. Pain is stable and not getting worse than at presentation.   Finally she is having some autonomic complaints including pounding heart and facial flushing in the absence of feeling warm several times a day. She has also had two optical migraines in the last two weeks. They came on suddenly with significant vision changes and she continued to have a headache afterwards. She has not gone through menopause, currently takes birth control, and has not had a period for over 25 years. She will be switching her primary care to dr Marissa [name] as her network has changed.  She has increased Amitriptyline to 20 MG QHS and it is helping her sleep through the night and with pain. She had had some dry mouth but it is tolerable.    Review of Systems Negative apart from HPI     Objective:   Physical Exam BP 123/80  Pulse 93  Ht 4' 10.5" (1.486 m)  Wt 105 lb (47.628 kg)  BMI 21.57 kg/m2  Vitals: no orthostatic changes. Pulse lying down 80 bpm, standing 84 bpm  NAD/ thin  Foot Inspection: Splitting of first and second toe. Loss of transverse and longitudinal arch. Bunionette on R and L foot. Right bunionette with bright pink at center. Left bunionette is darker and scabbed over Palpation: Tender to palpation at bunionette bilaterally. Right bunionette is soft to touch. ROM: Normal ROM and strength  Neck: Bilateral  tight trapezius muscle. Increased translation of C1-C2 without crepitus.       Assessment & Plan:  48 yo with Ehlers-Danlos type 3 presents with bilateral bunionettes who presents for orthotic fitting.  1. Bunionettes: Pt with loss of transverse arch but with full orthotic fitting had ankle instability. --Metatarsal pad added to shoe insole --Encouraged to do ankle strengthening proprioception exercises--standing on one leg and keeping ankle stable  2. Elbow pain: continued pain even with exercises and sleeve --Encouraged pt to continue to wear sleeve when active --Decrease level of exercise so that pain during the light isometric exercise and stretching is 1-2/10.   3. Ehlers-Danlos, Type 3: Autonomic features --Encourage pt to follow-up with PCP about menopause and birth control management  --Take pulse during flushing episodes She has a pituitary tumor that has been stable but I wonder about effect on her hormonal cycles  Seen with Marissa [name], MS4

## 2013-04-09 NOTE — Assessment & Plan Note (Signed)
Patient was fitted for a : standard, cushioned, semi-rigid orthotic. The orthotic was heated and afterward the patient stood on the orthotic blank positioned on the orthotic stand. The patient was positioned in subtalar neutral position and 10 degrees of ankle dorsiflexion in a weight bearing stance. After completion of molding, a stable base was applied to the orthotic blank. The blank was ground to a stable position for weight bearing. Size: 6 red eva Base: blue medium EVA Posting: none Additional orthotic padding: none  Preparation and evaluation time: 50 mins  Note she had improved comfort with walking after correction and this controlled her pronation. However she has horizontal shifts of ankle and knee Will need to do 1 leg balance to control this  I will monitor MSK system every 3 to 4 mos

## 2013-04-10 MED ORDER — AMITRIPTYLINE HCL 10 MG PO TABS: ORAL_TABLET | ORAL | Status: DC

## 2013-04-10 NOTE — Addendum Note (Signed)
Addended by: Cyd Silence on: 04/10/2013 10:46 AM   Modules accepted: Orders

## 2013-05-08 ENCOUNTER — Other Ambulatory Visit: Payer: Self-pay | Admitting: Sports Medicine

## 2013-05-27 ENCOUNTER — Other Ambulatory Visit: Payer: Self-pay | Admitting: Family Medicine

## 2013-05-27 DIAGNOSIS — Z1231 Encounter for screening mammogram for malignant neoplasm of breast: Secondary | ICD-10-CM

## 2013-06-25 ENCOUNTER — Encounter: Payer: Self-pay | Admitting: Sports Medicine

## 2013-06-25 ENCOUNTER — Ambulatory Visit (INDEPENDENT_AMBULATORY_CARE_PROVIDER_SITE_OTHER): Payer: No Typology Code available for payment source | Admitting: Sports Medicine

## 2013-06-25 VITALS — BP 117/79 | Ht 58.5 in | Wt 108.0 lb

## 2013-06-25 DIAGNOSIS — Q7962 Hypermobile Ehlers-Danlos syndrome: Secondary | ICD-10-CM

## 2013-06-25 DIAGNOSIS — Q796 Ehlers-Danlos syndrome, unspecified: Secondary | ICD-10-CM

## 2013-06-25 DIAGNOSIS — M25559 Pain in unspecified hip: Secondary | ICD-10-CM

## 2013-06-25 DIAGNOSIS — M25551 Pain in right hip: Secondary | ICD-10-CM | POA: Insufficient documentation

## 2013-06-25 MED ORDER — AMITRIPTYLINE HCL 25 MG PO TABS: ORAL_TABLET | ORAL | Status: DC

## 2013-06-25 MED ORDER — METHYLPREDNISOLONE ACETATE 40 MG/ML IJ SUSP
40.0000 mg | Freq: Once | INTRAMUSCULAR | Status: AC
  Administered 2013-06-25: 40 mg via INTRA_ARTICULAR

## 2013-06-25 NOTE — Assessment & Plan Note (Signed)
I am concerned that her joint syndrome continues to worsen around hips  We may need to try some formal PT If response does not steadily allow more activity  Follow more freqeuntly

## 2013-06-25 NOTE — Progress Notes (Signed)
Subjective:   CC: Right hip pain  HPI:   Patient is a 48 y.o. female with h/o Ehlers Danlos Syndrome Type 3 and chronic hip pain here for worsened hip pain. She reports pain ahs been worse over the past 2 weeks and intolerable both at rest and with activity. She has been needing to come home from work and get into bed, often missing dinner due to pain, taking tramadol and robaxin most nights for the past 2 weeks. She chrnoically takes amitriptyline 20mg  QHS and mobic 15mg  daily. Pain is at [facility] joint area and greater trochanter on right. It wakes her from sleep if she rolls onto the right side. Hip has been dislocating frequently for much longer than 2 weeks. She has not been able to do any exercises for the hips over the past week or any walking over the past 3 months (due to hip discomfort and bunionettes). She has not worn compression shorts because she has not seen the benefit.   Review of Systems - Per HPI.   PMH: EDS    Objective:  Physical Exam BP 117/79  Ht 4' 10.5" (1.486 m)  Wt 108 lb (48.988 kg)  BMI 22.18 kg/m2 GEN: NAD EXTR:  No erythema or swelling at hip Tenderness bilateral greater trochanter, R>L Tenderness right inguinal region  120 to 140 degrees bilateral hip rotation Right fadir tender, left fadir test nontender bilateral faber test nontender Posterior hip tender right Pelvis with increased laxity  MSK [place]: Right hip with greater trochanteric under hip rotators increased bursa size; labrum intact; hip flexor tendon with mild increased edema; femoral head, neck capsule all look normal.  Visible labrum is norm.    Assessment:     Marissa [name] is a 48 y.o. female with h/o EDS here for acute on chronic right hip pain.    Plan:     Right hip pain Acute on chronic symptoms; US findings consistent with bursitis with labrum intact. - US-guided steroid injection into trochanteric bursa - Increase amitriptyline to 25mg  QHS for 3-4 days, then to 30mg  QHS and call for  further recs. Rx'ed 25mg  tabs. - Limit activity for 5 days. - Return precautions reviewed. - F/u 1 mo.  Hilton Sinclair, MD [place], MD

## 2013-06-25 NOTE — Patient Instructions (Signed)
You had a steroid injection into your trochanteric bursa today. Use amitriptyline 25mg  nightly for 3-4 days, then increase to 30mg  and then call for further recommendations if this is helping. For the next 5 days, limit activity. If you have fever, chills, hot swollen joint, or other signs of infection, seek immediate care. Follow up in 1 month.

## 2013-06-25 NOTE — Assessment & Plan Note (Addendum)
Acute on chronic symptoms; US findings consistent with bursitis with labrum intact. - US-guided steroid injection into trochanteric bursa - Increase amitriptyline to 25mg  QHS for 3-4 days, then to 30mg  QHS and call for further recs. Rx'ed 25mg  tabs. - Limit activity for 5 days. - Return precautions reviewed. - F/u 1 mo.  Procedure:  Injection of deepp bursa below hip rotators attaching to GRT Troch RT Consent obtained and verified. Time-out conducted. Noted no overlying erythema, induration, or other signs of local infection. Skin prepped in a sterile fashion. Topical analgesic spray: Ethyl chloride. Completed without difficulty. Meds: 1 cc solumedrop 40 + 4 lidocaine Pain immediately improved suggesting accurate placement of the medication. Advised to call if fevers/chills, erythema, induration, drainage, or persistent bleeding.  I will follow this to see if she responds well to injection

## 2013-07-24 ENCOUNTER — Ambulatory Visit: Payer: No Typology Code available for payment source | Admitting: Sports Medicine

## 2013-08-23 ENCOUNTER — Ambulatory Visit
Admission: RE | Admit: 2013-08-23 | Discharge: 2013-08-23 | Disposition: A | Discharge status: Home / Self Care | Payer: No Typology Code available for payment source | Source: Ambulatory Visit | Attending: Family Medicine | Admitting: Family Medicine

## 2013-08-23 DIAGNOSIS — Z1231 Encounter for screening mammogram for malignant neoplasm of breast: Secondary | ICD-10-CM

## 2013-08-28 ENCOUNTER — Ambulatory Visit (INDEPENDENT_AMBULATORY_CARE_PROVIDER_SITE_OTHER): Payer: No Typology Code available for payment source | Admitting: Sports Medicine

## 2013-08-28 ENCOUNTER — Encounter: Payer: Self-pay | Admitting: Sports Medicine

## 2013-08-28 VITALS — BP 118/79 | Ht 58.5 in | Wt 105.0 lb

## 2013-08-28 DIAGNOSIS — Q7962 Hypermobile Ehlers-Danlos syndrome: Secondary | ICD-10-CM

## 2013-08-28 DIAGNOSIS — Q796 Ehlers-Danlos syndrome, unspecified: Secondary | ICD-10-CM

## 2013-08-28 DIAGNOSIS — G8929 Other chronic pain: Secondary | ICD-10-CM

## 2013-08-28 DIAGNOSIS — M533 Sacrococcygeal disorders, not elsewhere classified: Secondary | ICD-10-CM

## 2013-08-28 MED ORDER — TRAMADOL HCL 50 MG PO TABS: 50.0000 mg | ORAL_TABLET | Freq: Three times a day (TID) | ORAL | Status: DC | PRN

## 2013-08-28 NOTE — Patient Instructions (Signed)
-  Try to do about 10 minutes of gentle aerobic exercise daily -Start the 3 strengthening exercises:  1. 3-5 single leg squats daily  2. Lay on right side, balance in a plank position and hold for ~25 seconds, rest, repeat for 3-5 times.  3. Standing hip rotation (bend hip to 90 degrees), externally rotate to 60 degrees, return to forward, set leg down. Repeat 5-10 times a day. -Increase tramadol to 1 tablet up to 3 times daily as needed, refills sent. -Follow-up in 2-3 months or sooner if needed.

## 2013-08-28 NOTE — Assessment & Plan Note (Signed)
Plan as below for SI joint pain. Work on gradual strengthening program, gentle aerobic exercises. See below.

## 2013-08-28 NOTE — Assessment & Plan Note (Signed)
-  Try to do about 10 minutes of gentle aerobic exercise daily -Start the 3 strengthening exercises:  1. 3-5 single leg squats daily  2. Lay on right side, balance in a plank position and hold for ~25 seconds, rest, repeat for 3-5 times.  3. Standing hip rotation (bend hip to 90 degrees), externally rotate to 60 degrees, return to forward, set leg down. Repeat 5-10 times a day. -Increase tramadol to 1 tablet up to 3 times daily as needed, refills sent. -Follow-up in 2-3 months or sooner if needed.

## 2013-08-28 NOTE — Progress Notes (Signed)
   Subjective:    Patient ID: Marissa [name], female    DOB: 11-02-1965, 48 y.o.   MRN: [id number]  HPI Ms. Manocchio Is a 48 year old female with a past medical history of Ehlers-Danlos type 3, who presents for followup of right SI joint and lateral hip pain.She was last seen approximately 2 months ago when a greater trochanteric injection at the right hip was attempted.  She noticed that it was helpful for approximately 2 weeks, but her symptoms returned.  She is significantly hypermobile joints.  Her right SI joint pain is aggravated with sitting or sleeping on her side.  She notes associated fatigue, though she is able to sleep through the night without waking up.  She is taking meloxicam, tramadol, and Elavil 25 mg daily at bedtime, which relieved her symptoms somewhat.  Due to her fatigue she has not been very active with aerobic exercise.  Her mood is overall been stable, and she continues to see a therapist.  She requests a note for a standing desk while at work, which she has at home.  Being able to also next advance living with working, her symptoms are much improved.  She works for a [employer].  Past Medical History  Diagnosis Date  . History of pituitary tumor   . Ehlers-Danlos syndrome   . Ocular migraine     Review of Systems Pertinent positive: Right low back and SI joint pain, right hip pain Pertinent negatives: Fevers, chills, weakness, numbness, weight loss, night sweats 11 point review of systems was performed as otherwise negative.    Objective:   Physical Exam BP 118/79  Ht 4' 10.5" (1.486 m)  Wt 105 lb (47.628 kg)  BMI 21.57 kg/m2 GEN: The patient is well-developed well-nourished female and in no acute distress.  She is awake alert and oriented x3. SKIN: warm and well-perfused, no rash  EXTR: No lower extremity edema or calf tenderness Neuro: Strength 5/5 globally. Sensation intact throughout. DTRs 2/4 bilaterally. No focal deficits. Vasc: +2 bilateral  distal pulses. No edema.  MSK: With standing structural exam she has some widening of her MTP joints and toes, worse on the right compared to left.  Gait analysis is overall normal. A palpable right sided joint nodule is somewhat tender in the area of her SI joint.  Her bilateral hip abductors and gluteus medius testing are somewhat weakened.  Single leg squat is with increased imbalance on the right compared to left.  Negative pelvic compression test and there seems to be equal motion of the bilateral SI joints.  Full range of motion of the lumbar spine without deformity.     Assessment & Plan:  Please see problem list assessment and plan in the problem list.

## 2013-10-30 ENCOUNTER — Ambulatory Visit (INDEPENDENT_AMBULATORY_CARE_PROVIDER_SITE_OTHER): Payer: No Typology Code available for payment source | Admitting: Sports Medicine

## 2013-10-30 ENCOUNTER — Encounter: Payer: Self-pay | Admitting: Sports Medicine

## 2013-10-30 VITALS — BP 115/65 | Ht <= 58 in | Wt 105.0 lb

## 2013-10-30 DIAGNOSIS — G8929 Other chronic pain: Secondary | ICD-10-CM

## 2013-10-30 DIAGNOSIS — M205X9 Other deformities of toe(s) (acquired), unspecified foot: Secondary | ICD-10-CM

## 2013-10-30 DIAGNOSIS — M533 Sacrococcygeal disorders, not elsewhere classified: Secondary | ICD-10-CM

## 2013-10-30 DIAGNOSIS — Q7962 Hypermobile Ehlers-Danlos syndrome: Secondary | ICD-10-CM

## 2013-10-30 DIAGNOSIS — Q796 Ehlers-Danlos syndrome: Secondary | ICD-10-CM

## 2013-10-30 MED ORDER — METHOCARBAMOL 500 MG PO TABS: 500.0000 mg | ORAL_TABLET | Freq: Every day | ORAL | Status: DC

## 2013-10-30 MED ORDER — AMITRIPTYLINE HCL 25 MG PO TABS: ORAL_TABLET | ORAL | Status: DC

## 2013-10-30 NOTE — Progress Notes (Signed)
Patient ID: Marissa [name], female   DOB: 1965-06-02, 48 y.o.   MRN: [id number]  Patient returns in followup of her Drue Dun type III We have her on a home exercise program and a modified certain activities She is walking less and it seemed that that may have been a trigger for her shoulder pain Shoulder pain is significantly improved although she still must sleep on her back or else-- shoulders will sublux  She has less pain overall with fewer days in which significant pain in her right hip that is severe able to take amitriptyline at night which gives her better rest  However with external rotation of the right hip or with certain movements she will periodically get a sharp pain suggestive of sacroiliac joint subluxation Radiation into the upper thigh  Uses a standing desk  Has found better shoes with soft upper  Secondary problem Difficulty hearing and pressure in the right ear  Physical examination  Neck and head position is more normal and she is in no acute distress BP 115/65  Ht 4\' 10"  (1.473 m)  Wt 105 lb (47.628 kg)  BMI 21.95 kg/m2  Shoulder motion is full with no spasm over either trapezius She has a positive sulcus sign bilaterally Rotator cuff testing non-painful  Hip rotation is increased bilaterally On the right external rotation causes hip pain Corky Sox causes pain that refers to the right SI joint  Both SI joints move freely today  Pain on palpation of the right SI joint  ENT exam  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

## 2013-10-30 NOTE — Patient Instructions (Signed)
I would suggest evaluation by Marissa [name] at [facility]  Try to stabilize the Right hemipelvis and particularly avoid subluxing RT SIJ  Keep up good posture for neck and shoulders  Keep up 1 leg balance exercises  Keep periodic rechecks with me

## 2013-10-30 NOTE — Assessment & Plan Note (Signed)
Continue to work on posture  Continue on short range of motion strength exercises  Overall some improvement

## 2013-10-30 NOTE — Assessment & Plan Note (Signed)
Less pain in new shoes with soft upper

## 2013-10-30 NOTE — Assessment & Plan Note (Signed)
She has found information about a physical therapy program that looks promising design for ED patients  I suggested seeing Leatrice Jewels for PT and Pilates assessment to stabilize RT hemipelvis and SIJ  Continue amitriptyline at night

## 2014-01-23 ENCOUNTER — Encounter: Payer: Self-pay | Admitting: Sports Medicine

## 2014-01-23 ENCOUNTER — Ambulatory Visit (INDEPENDENT_AMBULATORY_CARE_PROVIDER_SITE_OTHER): Payer: No Typology Code available for payment source | Admitting: Sports Medicine

## 2014-01-23 VITALS — BP 124/86 | Ht 58.5 in | Wt 105.0 lb

## 2014-01-23 DIAGNOSIS — Q7962 Hypermobile Ehlers-Danlos syndrome: Secondary | ICD-10-CM

## 2014-01-23 DIAGNOSIS — Q796 Ehlers-Danlos syndrome: Secondary | ICD-10-CM

## 2014-01-23 NOTE — Progress Notes (Signed)
   Subjective:    Patient ID: Marissa [name], female    DOB: 05-Nov-1965, 49 y.o.   MRN: [id number]  HPI Marissa [name] is a 49 year old female with Marissa [name] who presents today for follow-up. Her primary concern today is a bunionette on her right lateral foot. She states that the pain is intolerable and she can only wear soft-sided shoes, which still has some pain. She is using a spacer that doesn't seem to help. The orthotics no longer help. She has to sleep with her foot out from under the blankets.   As for her hypermobility, the PT seems to be helping with stability.  Less hip pain particularly over RT Greater troch.  Review of Systems ROS negative other than what is mentioned in HPI.    Objective:   Physical Exam  Gen: Well-appearing, no acute distress Ext: bunionette on R 5th MTP, smaller one on left. No ulceration on right. MSK: Hypermobility of all joints. No subluxation of shoulders on simple exam, but positive sulcus sign. Scapular winging is less than prior exams. Improved posture. Pelvis in neutral position. FADIR exam causes pain on right hip. ROM both hips 140 degrees.  Good bilateral hip strength.      Assessment & Plan:  Marissa [name] is a 49 year old with EDS [name]. She seems to be getting benefit from PT and pilates.  1. EDS [name] - continue PT and pilates  Return to clinic in 3 months.  Patient was seen and discussed with my attending, Dr. Oneida Alar.  Freddrick March, MD, PGY-1 01/23/2014  11:40 AM   Evaluated and agree with documentation and plan.  Ila Mcgill, MD

## 2014-01-23 NOTE — Assessment & Plan Note (Signed)
We need to continue the MSK preventive program She clearly has made some gains with less pain and better strength  Foot issues continue to be a challenge and will look at different types of padding including bunion pad given today/ moleskin

## 2014-01-23 NOTE — Progress Notes (Deleted)
    1. Right foot bother most. Using spacer. Pain is intolerable. Sleeping w/ foot out from blankets. orthotis dont help.  bunyon R 5th MDP, smaller on L

## 2014-04-23 ENCOUNTER — Other Ambulatory Visit: Payer: Self-pay | Admitting: Sports Medicine

## 2014-04-23 ENCOUNTER — Other Ambulatory Visit: Payer: Self-pay | Admitting: *Deleted

## 2014-04-23 MED ORDER — TRAMADOL HCL 50 MG PO TABS: 50.0000 mg | ORAL_TABLET | Freq: Four times a day (QID) | ORAL | Status: DC | PRN

## 2014-04-24 ENCOUNTER — Ambulatory Visit (INDEPENDENT_AMBULATORY_CARE_PROVIDER_SITE_OTHER): Payer: No Typology Code available for payment source | Admitting: Sports Medicine

## 2014-04-24 ENCOUNTER — Encounter: Payer: Self-pay | Admitting: Sports Medicine

## 2014-04-24 VITALS — BP 114/67 | Ht 58.5 in | Wt 105.0 lb

## 2014-04-24 DIAGNOSIS — Q796 Ehlers-Danlos syndrome: Secondary | ICD-10-CM | POA: Diagnosis not present

## 2014-04-24 DIAGNOSIS — Q7962 Hypermobile Ehlers-Danlos syndrome: Secondary | ICD-10-CM

## 2014-04-24 NOTE — Assessment & Plan Note (Signed)
I suspect she is getting a cervical facet joint subluxation traction in the C8/T1 nerve root  This is presenting like a thoracic outlet syndrome  She also seems to have some fixed thoracic facet joint locking  We will work with positional exercises and with PT and Pilates  Increase medications to block her symptoms

## 2014-04-24 NOTE — Progress Notes (Signed)
   Subjective:    Patient ID: Marissa [name], female DOB: 1965/06/28, 49 y.o. MRN: [id number]  HPI Marissa [name] is a 49 y.o. female with a history of Ehlers Danlos III who presents with left posterior shoulder pain that began 4 days ago. Localizes to the posterior inferior axilla. She also complains of pain in her left medial wrist. She has noticed that her left hand is cooler than her right. She usually eats with her left hand but has had to eat with her right hand because the pain is so severe. Continues to take Tramadol daily with some relief. Continues to participate in PT and pilates. Denies weakness or numbness.   She does note that left trapezius is having spasm and that triggers headache. She also knows that positioning her arm behind and below her left scapula lessens the tingling into her arm    Objective:  Physical Exam Blood pressure 114/67, height 4' 10.5" (1.486 m), weight 105 lb (47.628 kg). Gen: Well appearing, no acute distress MSK:  Normal appearance of arms and shoulders bilaterally.  Full ROM of neck.  Normal ROM of arms and shoulders. She gets clicking and some minor subluxations with movement of both shoulders Spasms in left trapezius muscle.  Normal Roo's and Adison's tests (radial pulse normal).  Left hand noticeably cooler than right.  2+ biceps and brachioradialis reflexes.   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  - Increase Elavil up to 50 mg as tolerated. - T stretches (lie on back and stretch arms out). - Continue PT. Have Butch Penny gently work on thoracic facet joints.  - Any position, heat, or cold is fine if it improves the pain.  - Follow up in 3 months.   Roger Kill, MD [facility] Pediatric[facility] PGY1  Edited and reviewed.  Ila Mcgill, MD

## 2014-04-24 NOTE — Patient Instructions (Addendum)
Increase Tramadol to 3 tablets a day.  Increase Elavil up to 50 mg as tolerated. T stretches (lie on back and stretch arms out). Have Butch Penny gently work on thoracic facet joints.  Any position, heat, or cold is fine if it improves the pain.  Follow up in 3 months.

## 2014-04-30 ENCOUNTER — Other Ambulatory Visit: Payer: Self-pay | Admitting: *Deleted

## 2014-04-30 MED ORDER — PREDNISONE 20 MG PO TABS: 20.0000 mg | ORAL_TABLET | Freq: Two times a day (BID) | ORAL | Status: DC

## 2014-05-13 ENCOUNTER — Encounter: Payer: Self-pay | Admitting: Sports Medicine

## 2014-05-13 DIAGNOSIS — M5010 Cervical disc disorder with radiculopathy, unspecified cervical region: Secondary | ICD-10-CM

## 2014-05-13 NOTE — Progress Notes (Signed)
Patient ID: Marissa [name], female   DOB: [birth year], 49 y.o.   MRN: [id number]  Telephone follow up:  Took 1 week of prednisone and was improving Came off prednisone and much worse pain into axilla and arm Pilates made this hurt worse  Dr Inda Merlin gave her some prednisone to restart over weekend and this has again taken some of edge off   Last visit suggestive of throracic outlet type of pain/ probably cervical radiculopathy

## 2014-05-13 NOTE — Assessment & Plan Note (Signed)
Cont tramadol and amitriptyline  Finish second week of prednisone  Start using cervical collar  Stop activity until this settles down  Reassess if not improving in 1 week

## 2014-05-16 ENCOUNTER — Telehealth: Payer: Self-pay | Admitting: Sports Medicine

## 2014-05-16 ENCOUNTER — Other Ambulatory Visit: Payer: Self-pay | Admitting: *Deleted

## 2014-05-16 MED ORDER — HYDROCODONE-ACETAMINOPHEN 5-300 MG PO TABS: ORAL_TABLET | ORAL | Status: DC

## 2014-05-16 MED ORDER — GABAPENTIN 300 MG PO CAPS: 300.0000 mg | ORAL_CAPSULE | Freq: Three times a day (TID) | ORAL | Status: DC

## 2014-05-16 MED ORDER — HYDROCODONE-ACETAMINOPHEN 5-325 MG PO TABS: 1.0000 | ORAL_TABLET | ORAL | Status: DC | PRN

## 2014-05-16 NOTE — Telephone Encounter (Signed)
I called:  Worsening pain into the left arm axilla and rib cage. Sounds like either an acute disc or brachial plexus injury.  Start gabapentin 300 3 times a day  Vicodin 5 300 every 4 hours for pain  See in the office in 5 days and probably schedule MRI

## 2014-05-20 ENCOUNTER — Ambulatory Visit (INDEPENDENT_AMBULATORY_CARE_PROVIDER_SITE_OTHER): Payer: No Typology Code available for payment source | Admitting: Sports Medicine

## 2014-05-20 ENCOUNTER — Encounter: Payer: Self-pay | Admitting: Sports Medicine

## 2014-05-20 VITALS — BP 127/78 | Ht 58.5 in | Wt 105.0 lb

## 2014-05-20 DIAGNOSIS — M542 Cervicalgia: Secondary | ICD-10-CM

## 2014-05-20 DIAGNOSIS — M5010 Cervical disc disorder with radiculopathy, unspecified cervical region: Secondary | ICD-10-CM

## 2014-05-20 NOTE — Progress Notes (Signed)
Patient ID: Marissa [name], female   DOB: [birth year], 49 y.o.   MRN: [id number]  Pt with ED 3 Deep pain persists into axillae and upper back Down into upper arm on left Arm feels heavy Strong doses of pain meds made this tolerable through weekend Hand still cold at times  Has had shoulder surgery on left to try to stabilize multidirectional stability (Dr Onnie Graham) However, this broke down quickly and went back to surgery within 5 wks Now still feels loose  Meds Gabapentin 300 tid Vicodin - 3 to 4 per day during weekend Back to tramadol yesterday Amitriptyline 35 at night  Exam Looks uncomfortable BP 127/78 mmHg  Ht 4' 10.5" (1.486 m)  Wt 105 lb (47.628 kg)  BMI 21.57 kg/m2   Full ROM of left shoulder w no pain Impingement tests neg  Neck full ROM does not cause pain Spurling's does not cause radiation  Reflexes full  Left hand cooler

## 2014-05-20 NOTE — Assessment & Plan Note (Signed)
I think we need to move ahead with an MRI  Try to continue gabapentin 3 times a day unless too drowsy  Used tramadol if adequate for pain  We will add Vicodin for worse flares  Amitriptyline at night  If her MRI were unrevealing we will need to consider thoracic outlet syndrome

## 2014-05-22 ENCOUNTER — Ambulatory Visit
Admission: RE | Admit: 2014-05-22 | Discharge: 2014-05-22 | Disposition: A | Discharge status: Home / Self Care | Payer: No Typology Code available for payment source | Source: Ambulatory Visit | Attending: Sports Medicine | Admitting: Sports Medicine

## 2014-05-22 DIAGNOSIS — M542 Cervicalgia: Secondary | ICD-10-CM

## 2014-05-24 ENCOUNTER — Other Ambulatory Visit: Payer: No Typology Code available for payment source

## 2014-05-28 ENCOUNTER — Other Ambulatory Visit: Payer: Self-pay | Admitting: *Deleted

## 2014-05-28 ENCOUNTER — Other Ambulatory Visit: Payer: Self-pay | Admitting: Sports Medicine

## 2014-05-28 MED ORDER — GABAPENTIN (ONCE-DAILY) 600 MG PO TABS: ORAL_TABLET | ORAL | Status: DC

## 2014-05-28 MED ORDER — GABAPENTIN 300 MG PO CAPS: ORAL_CAPSULE | ORAL | Status: DC

## 2014-05-28 NOTE — Telephone Encounter (Signed)
Sent in new rx for gabapentin, pt is taking 2100mg  total daily

## 2014-06-10 ENCOUNTER — Ambulatory Visit (INDEPENDENT_AMBULATORY_CARE_PROVIDER_SITE_OTHER): Payer: No Typology Code available for payment source | Admitting: Sports Medicine

## 2014-06-10 ENCOUNTER — Encounter: Payer: Self-pay | Admitting: Sports Medicine

## 2014-06-10 VITALS — BP 131/78 | Ht 58.5 in | Wt 105.0 lb

## 2014-06-10 DIAGNOSIS — M5010 Cervical disc disorder with radiculopathy, unspecified cervical region: Secondary | ICD-10-CM

## 2014-06-10 NOTE — Assessment & Plan Note (Signed)
This is somewhat improved as she is now approaching 8 weeks  We will continue current medications  Continue with relative rest  Use cervical collar as needed  Recheck one month

## 2014-06-10 NOTE — Progress Notes (Signed)
Patient ID: Marissa [name], female   DOB: 09-Mar-1965, 49 y.o.   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  Gabapentin is up 2100 per day and has been helpful Amitriptyline 35 at night Tramadol 1/2 tab during day once or twice but will take 1 or 2 at night This is working most nights and has been able to stop Vicodin  Sleeps in neck pillow  Left arm feels dead and painful while at standing desk at work  With the medications above she is able to continue working During the evening she is just resting the neck and arm  Examination No acute distress and she looks less uncomfortable than before BP 131/78 mmHg  Ht 4' 10.5" (1.486 m)  Wt 105 lb (47.628 kg)  BMI 21.57 kg/m2  Neck range of motion is full without a lot of pain on easy motion Strength from C5-T1 is normal bilaterally Reflexes are preserved at triceps and biceps  Resistance of wrist extension which causes sharp pain on the left wrist Resistance of shoulder abduction causes pain at the shoulder blade and under the left axilla

## 2014-06-10 NOTE — Patient Instructions (Signed)
Plans  Keep meds the same Don't lift with left arm and use only as needed  Sleep with neck support  Give this rest and not active exercise at this point  Easy neck motion only for exercise  See me in 1 month

## 2014-06-24 ENCOUNTER — Encounter: Payer: Self-pay | Admitting: *Deleted

## 2014-07-17 ENCOUNTER — Ambulatory Visit: Payer: No Typology Code available for payment source | Admitting: Sports Medicine

## 2014-07-18 ENCOUNTER — Other Ambulatory Visit: Payer: Self-pay

## 2014-07-18 DIAGNOSIS — Z1231 Encounter for screening mammogram for malignant neoplasm of breast: Secondary | ICD-10-CM

## 2014-07-22 ENCOUNTER — Other Ambulatory Visit: Payer: Self-pay | Admitting: *Deleted

## 2014-07-22 MED ORDER — GABAPENTIN (ONCE-DAILY) 600 MG PO TABS: ORAL_TABLET | ORAL | Status: DC

## 2014-08-04 ENCOUNTER — Other Ambulatory Visit: Payer: Self-pay | Admitting: *Deleted

## 2014-08-04 MED ORDER — AMITRIPTYLINE HCL 25 MG PO TABS: ORAL_TABLET | ORAL | Status: DC

## 2014-08-25 ENCOUNTER — Ambulatory Visit
Admission: RE | Admit: 2014-08-25 | Discharge: 2014-08-25 | Disposition: A | Discharge status: Home / Self Care | Payer: No Typology Code available for payment source | Source: Ambulatory Visit

## 2014-08-25 DIAGNOSIS — Z1231 Encounter for screening mammogram for malignant neoplasm of breast: Secondary | ICD-10-CM

## 2014-08-26 ENCOUNTER — Other Ambulatory Visit: Payer: Self-pay | Admitting: Family Medicine

## 2014-08-26 DIAGNOSIS — R928 Other abnormal and inconclusive findings on diagnostic imaging of breast: Secondary | ICD-10-CM

## 2014-08-28 ENCOUNTER — Ambulatory Visit (INDEPENDENT_AMBULATORY_CARE_PROVIDER_SITE_OTHER): Payer: No Typology Code available for payment source | Admitting: Sports Medicine

## 2014-08-28 ENCOUNTER — Encounter: Payer: Self-pay | Admitting: Sports Medicine

## 2014-08-28 VITALS — BP 132/72 | Ht 58.5 in | Wt 105.0 lb

## 2014-08-28 DIAGNOSIS — Q796 Ehlers-Danlos syndrome: Secondary | ICD-10-CM

## 2014-08-28 DIAGNOSIS — M5010 Cervical disc disorder with radiculopathy, unspecified cervical region: Secondary | ICD-10-CM | POA: Diagnosis not present

## 2014-08-28 DIAGNOSIS — Q7962 Hypermobile Ehlers-Danlos syndrome: Secondary | ICD-10-CM

## 2014-08-28 NOTE — Assessment & Plan Note (Addendum)
Very nice response to single level fusion c5/6 and diskectomy 07/03/14 by Dr Sherwood Gambler  Given some simple exercises to try to resolve some of her trapezius spasm  Cont on gabapentin at 1500

## 2014-08-28 NOTE — Progress Notes (Signed)
Patient ID: Marissa [name], female   DOB: [birth year], 49 y.o.   MRN: [id number]  Surgery 6/16 for C5/6 with plate and diskectomy - Dr Sherwood Gambler She has done very well Now has radiating pain of a 2 when before surgery was up to 9 Trapezius still stays very tight  Tried to wean gabapentin from 2100 but at 1200 return of sxs Now feels stable at 1500 mgm per day  Not feeling weakness in left arm Movement is only painful with forearm pronation or grip  Starting back exercises with some hip pain  Would like to return to Pilates  Exam  Nad BP 132/72 mmHg  Ht 4' 10.5" (1.486 m)  Wt 105 lb (47.628 kg)  BMI 21.57 kg/m2  Well healed scar over neck and has full ROM of neck Neck does not seem as mobile on AP translation as before  C5 to T1 testing is normal/ good strength upp ext  Feels some pain at left wrist but exam is unremarkable  Strength is good in hip abduction and other lower ext testing

## 2014-08-28 NOTE — Patient Instructions (Signed)
You are doing well  Leave the gabapentin at current dose unless pain drops a fair amount for a few days in a row  Trapezius spasm persists Arm shakes out Resisted shoulder shrug Resisted shoulder retraction  These may help to do several times during day  Start Pilates but stabilize neck and shoulder during activity Focus on lower extremities But do some easy relaxation for left shoulder and trapezius  Let's see you in 6 weeks

## 2014-08-28 NOTE — Assessment & Plan Note (Signed)
I would suggest restarting PT Pilates with Marissa [name]  Work on joint position sense and stabilzation to focus on lower sements until clear to work more on cervical spine  Reck with me 6 wks

## 2014-08-29 ENCOUNTER — Ambulatory Visit
Admission: RE | Admit: 2014-08-29 | Discharge: 2014-08-29 | Disposition: A | Discharge status: Home / Self Care | Payer: No Typology Code available for payment source | Source: Ambulatory Visit | Attending: Family Medicine | Admitting: Family Medicine

## 2014-08-29 DIAGNOSIS — R928 Other abnormal and inconclusive findings on diagnostic imaging of breast: Secondary | ICD-10-CM

## 2014-10-15 ENCOUNTER — Encounter: Payer: Self-pay | Admitting: Sports Medicine

## 2014-10-15 ENCOUNTER — Ambulatory Visit (INDEPENDENT_AMBULATORY_CARE_PROVIDER_SITE_OTHER): Payer: No Typology Code available for payment source | Admitting: Sports Medicine

## 2014-10-15 VITALS — BP 117/75 | HR 93 | Ht 58.5 in | Wt 106.0 lb

## 2014-10-15 DIAGNOSIS — M5010 Cervical disc disorder with radiculopathy, unspecified cervical region: Secondary | ICD-10-CM

## 2014-10-15 NOTE — Assessment & Plan Note (Signed)
Worsening left arm pain several months post C5-C6 fusion in patient with Fredderick Phenix- Danlos.  Reportedly less than ideal bone formation on XR with Neurosurgeon.   - Wear your cervical collar when you begin to fatigue  - Recommend she do resisted shoulder lifts.  - Continue to work on scapular retraction.  - Continue with isometric neck exercises.  - Periodic arm swings to help relax the traps.  - Patient to call back if the radicular symptoms persist and we can consider increasing the gabapentin.  - f/u PRN>

## 2014-10-15 NOTE — Progress Notes (Signed)
Marissa [name] - 49 y.o. 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 Surgery 07/03/2014. All but the upper arm pressure had resolved following the surgery.   - Saw Dr. Rita Ohara 2 days ago.  Fusion not as complete as he would expect. No further intervention needed at this time. Following up again in 2 weeks.  - Now at 900mg  of gabapentin.  Dr. Rita Ohara recommended against an increase in gabapentin.  Recemmended 440mg  bid.  - Taking amitripyline 50mg  qHS  - No longer taking tramadol, norco, or vicodin.   - Doing isometric strengthening exercises as well as PT pilates.  - Never wears cervical collar when fatiguing.   New symptom is gnawing scapular pain on the lateral border of left scapula (since surgery, although she did have this in the past).    Fusion C5-C6.   ROS:     14 point RoS negative other than that in HPI.   HISTORY: Past Medical, Surgical, Social, and Family History Reviewed & Updated per EMR.    OBJECTIVE: BP 117/75 mmHg  Pulse 93  Wt 106 lb (48.081 kg)  Physical Exam  Calm, NAD Non-labored breathing speaking in full sentences.   Neck range of motion is full without a lot of pain on easy motion.  Strength from C5-T1 is normal bilaterally. Sensation intact.  Reflexes are preserved at triceps and biceps  Shoulder: left Inspection reveals no abnormalities, atrophy or asymmetry. Tender along the posterior axilla.  Tender as TM/Lat wraps around posterior shoulder.  ROM is full in all planes with extra.  Tight left trap.  Rotator cuff strength normal throughout. No pain with resisted motion. No pain with resisted shoulder extension. C5-T1 strength.  Scapular winging present, L>R  Wrist: left Inspection normal with no visible erythema or swelling. ROM smooth and normal with  good flexion and extension and ulnar/radial deviation that is symmetrical with opposite wrist. Palpation is normal over metacarpals, navicular, lunate. Some tenderness at the TFCC.   Tendons without tenderness/ swelling. Strength 5/5 in all directions, but pain with resisted extension.   MEDICATIONS, LABS & OTHER ORDERS: Previous Medications   AMITRIPTYLINE (ELAVIL) 25 MG TABLET    Take 2 tablets at night   AMOXICILLIN (AMOXIL) 875 MG TABLET       AMOXICILLIN-CLAVULANATE (AUGMENTIN) 875-125 MG PER TABLET       AZELASTINE (ASTELIN) 0.1 % NASAL SPRAY       CYCLOBENZAPRINE (FLEXERIL) 10 MG TABLET    TAKE HALF TO ONE TAB BY MOUTH THREE TIMES DAILY AS NEEDED FOR MUSCLE SPASMS   DIPHENHYDRAMINE-ACETAMINOPHEN (TYLENOL PM) 25-500 MG TABS    Take 1 tablet by mouth at bedtime as needed.   DOXYCYCLINE (VIBRA-TABS) 100 MG TABLET       FLUCONAZOLE (DIFLUCAN) 150 MG TABLET       GABAPENTIN (NEURONTIN) 300 MG CAPSULE    Take one tablet in the morning, take 2 tablets at lunchtime, take 2 in the afternoon and take 2 at bedtime.   GABAPENTIN (NEURONTIN) 600 MG TABLET    TAKE 1 TABLET BY MOUTH 4 TIMES DAILY   HYDROCODONE-ACETAMINOPHEN (NORCO/VICODIN) 5-325 MG PER TABLET    Take 1 tablet by mouth every 4 (four) hours as needed for moderate pain.   HYDROCODONE-ACETAMINOPHEN 5-300 MG TABS  Take one tab q4h prn pain   MELOXICAM (MOBIC) 15 MG TABLET    Take 15 mg by mouth daily.   METHOCARBAMOL (ROBAXIN) 500 MG TABLET    Take 1 tablet (500 mg total) by mouth at bedtime.   NORGESTREL-ETHINYL ESTRADIOL (CRYSELLE-28) 0.3-30 MG-MCG TABLET    Take active pills daily/continuosly, skip placebo pills   PEG 3350-KCL-NABCB-NACL-NASULF (PEG-3350/ELECTROLYTES) 236 G SOLR       PREDNISONE (DELTASONE) 20 MG TABLET    Take 1 tablet (20 mg total) by mouth 2 (two) times daily with a meal.   TRAMADOL (ULTRAM) 50 MG TABLET    Take 1 tablet (50 mg total) by mouth every 6 (six) hours as needed.   VERAPAMIL (CALAN-SR) 120 MG CR TABLET     Take 120 mg by mouth.   VERAPAMIL (VERELAN PM) 120 MG 24 HR CAPSULE    Take 1 capsule (120 mg total) by mouth at bedtime.   Modified Medications   No medications on file   New Prescriptions   No medications on file   Discontinued Medications   CRYSELLE-28 0.3-30 MG-MCG TABLET    Take by mouth daily.   GABAPENTIN (NEURONTIN) 600 MG TABLET    TAKE 1 TABLET BY MOUTH 4 TIMES DAILY   GABAPENTIN, ONCE-DAILY, 600 MG TABS    Take one tab QID   MELOXICAM (MOBIC) 15 MG TABLET    Take 15 mg by mouth.   NORGESTREL-ETHINYL ESTRADIOL (CRYSELLE-28) 0.3-30 MG-MCG TABLET    Take 1 tablet by mouth.  No orders of the defined types were placed in this encounter.   ASSESSMENT & PLAN: See problem based charting & AVS for pt instructions.

## 2014-10-15 NOTE — Patient Instructions (Signed)
Wear your cervical collar when you begin to fatigue  Resisted shoulder lifts.   Continue to work on scapular retraction.  Continue with isometric neck exercises.   Periodic arm swings to help relax the traps.    You can call back if the radicular symptoms persist and we can consider increasing you gabapentin.

## 2014-12-15 ENCOUNTER — Encounter: Payer: Self-pay | Admitting: Sports Medicine

## 2014-12-15 ENCOUNTER — Ambulatory Visit (INDEPENDENT_AMBULATORY_CARE_PROVIDER_SITE_OTHER): Payer: Managed Care, Other (non HMO) | Admitting: Sports Medicine

## 2014-12-15 VITALS — BP 110/78 | Ht 58.5 in | Wt 108.0 lb

## 2014-12-15 DIAGNOSIS — Q796 Ehlers-Danlos syndrome: Secondary | ICD-10-CM | POA: Diagnosis not present

## 2014-12-15 DIAGNOSIS — M5010 Cervical disc disorder with radiculopathy, unspecified cervical region: Secondary | ICD-10-CM | POA: Diagnosis not present

## 2014-12-15 DIAGNOSIS — Q7962 Hypermobile Ehlers-Danlos syndrome: Secondary | ICD-10-CM

## 2014-12-15 DIAGNOSIS — M21629 Bunionette of unspecified foot: Secondary | ICD-10-CM | POA: Diagnosis not present

## 2014-12-15 MED ORDER — CEPHALEXIN 500 MG PO CAPS: 500.0000 mg | ORAL_CAPSULE | Freq: Two times a day (BID) | ORAL | Status: DC

## 2014-12-15 NOTE — Assessment & Plan Note (Signed)
Pilates is really helping her jont stability  Keep this up but protect neck

## 2014-12-15 NOTE — Progress Notes (Signed)
Patient ID: Marissa [name], female   DOB: [birth year], 49 y.o.   MRN: [id number]  Left bunionette very painful Has gotten much worse last few days More redness noted  Now about 5 mos past a single level fusion C5/6 Fusion is almost complete She saw Dr Sherwood Gambler with new sxs MRI showed a new disk herniation at C4/5  On voltaren with mild pain relief  Gabapentin seems to help more If she misses dose gets more radicular pain to arm and upper back  No weakness noted to date  Has continued pilates and seen her strength improve  Sleep is difficult  Pain ofter at 5/10 or above  ROS No fever or chills No POTS sxs recently No joint swelling  Exam NAD but looks in mild discomfort BP 110/78 mmHg  Ht 4' 10.5" (1.486 m)  Wt 108 lb (48.988 kg)  BMI 22.18 kg/m2  Neck shows a normal range of motion When she goes past the standard ROM with lateral bend gets pain to left shoulder and arm Mild pain with rotation past 60 deg left Flexion is pain free Extension gets pain at 50 deg  Strength Hand grip normal C5 to T1 testing norm strength

## 2014-12-15 NOTE — Patient Instructions (Signed)
Let's gradually increase dose of gabapentin to control pain Add 300 mgm at a time  Give at least 3 days before increasing dose  Keflex 500 bid for 5 days Warm soaks for left foot  I want you to do very simple and easy neck rehab  Easy motion 2 finger resistance - breath and relax 3 to 4 times in 4 motion and with bending and rotation  Check with me in february

## 2014-12-15 NOTE — Assessment & Plan Note (Signed)
On left this is reddned and markedly TTP  I am afraid of infection  Tx with Keflex 500 bid x 5 days Use padding Let me know if still painful

## 2014-12-15 NOTE — Assessment & Plan Note (Signed)
Now with new disk at level above fusion I think this needs to be treated medically I am not convinced with her EDS that she will heal normally with surgery  Steadily increase dose of gabapentin until pain relief Gentle HEP  Reck 2 mos

## 2014-12-16 ENCOUNTER — Ambulatory Visit: Payer: No Typology Code available for payment source | Admitting: Sports Medicine

## 2014-12-29 ENCOUNTER — Other Ambulatory Visit: Payer: Self-pay | Admitting: *Deleted

## 2014-12-29 MED ORDER — GABAPENTIN 600 MG PO TABS: ORAL_TABLET | ORAL | Status: DC

## 2015-01-05 ENCOUNTER — Other Ambulatory Visit: Payer: Self-pay | Admitting: *Deleted

## 2015-01-05 MED ORDER — AMITRIPTYLINE HCL 25 MG PO TABS: ORAL_TABLET | ORAL | Status: DC

## 2015-01-19 ENCOUNTER — Other Ambulatory Visit: Payer: Self-pay | Admitting: *Deleted

## 2015-01-19 MED ORDER — METHOCARBAMOL 500 MG PO TABS: 500.0000 mg | ORAL_TABLET | Freq: Every day | ORAL | Status: DC

## 2015-02-25 ENCOUNTER — Ambulatory Visit (INDEPENDENT_AMBULATORY_CARE_PROVIDER_SITE_OTHER): Payer: Managed Care, Other (non HMO) | Admitting: Sports Medicine

## 2015-02-25 ENCOUNTER — Encounter: Payer: Self-pay | Admitting: Sports Medicine

## 2015-02-25 VITALS — BP 116/64 | Ht 58.5 in | Wt 108.0 lb

## 2015-02-25 DIAGNOSIS — M5010 Cervical disc disorder with radiculopathy, unspecified cervical region: Secondary | ICD-10-CM | POA: Diagnosis not present

## 2015-02-25 DIAGNOSIS — Q7962 Hypermobile Ehlers-Danlos syndrome: Secondary | ICD-10-CM

## 2015-02-25 DIAGNOSIS — M533 Sacrococcygeal disorders, not elsewhere classified: Secondary | ICD-10-CM | POA: Diagnosis not present

## 2015-02-25 DIAGNOSIS — G8929 Other chronic pain: Secondary | ICD-10-CM

## 2015-02-25 DIAGNOSIS — Q796 Ehlers-Danlos syndrome: Secondary | ICD-10-CM | POA: Diagnosis not present

## 2015-02-25 DIAGNOSIS — S63104A Unspecified dislocation of right thumb, initial encounter: Secondary | ICD-10-CM | POA: Diagnosis not present

## 2015-02-25 MED ORDER — TRIAMCINOLONE ACETONIDE 10 MG/ML IJ SUSP
10.0000 mg | Freq: Once | INTRAMUSCULAR | Status: AC
  Administered 2015-02-25: 10 mg via INTRAMUSCULAR

## 2015-02-25 MED ORDER — TRAMADOL HCL 50 MG PO TABS
50.0000 mg | ORAL_TABLET | Freq: Three times a day (TID) | ORAL | Status: DC | PRN
Start: 2015-02-25 — End: 2015-04-29

## 2015-02-25 NOTE — Progress Notes (Signed)
Marissa [name] - 50 y.o. 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  - Began developing new symptoms following surgery had an MRI by Dr. Rita Ohara showing a C4-5 disc herniation although she denies any frank weakness - She has a follow-up with Dr. Mariea Clonts on 03/07/2015. - Now at 1800 mg of gabapentin.  -- Has tried Lyrica in the past with minimal relief or non-neuropathic symptoms such as SI pathology  - She also takes Robaxin daily at bedtime - Taking amitripyline 25mg  qHS as she cannot tolerate amitriptyline 50 mg daily at bedtime - No longer taking tramadol, norco, or vicodin.   - Never wears cervical collar when fatiguing, although she admits not as much and she probably should've that does feel good when she wears this. 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 She does notice some sharp pain when she takes her foot out of strap Pilates. - She will be discontinuing diclofenac as it is giving her side effects. - Sleep continues to be difficult.  She reports some of her autonomic symptoms are returning including facial flushing.   ROS:     14 point RoS negative other than that in HPI.   HISTORY: Past Medical, Surgical, Social, and Family History Reviewed & Updated per EMR.    OBJECTIVE: BP 116/64 mmHg  Ht 4' 10.5" (1.486 m)  Wt 108 lb (48.988 kg)  BMI 22.18 kg/m2  Physical Exam  Calm, NAD Non-labored breathing speaking in full sentences.   Neck range of motion is full without a lot of pain on easy motion. This is more than would be expected following a fusion Strength from C5-T1 is normal bilaterally. Sensation intact.  Reflexes are preserved at triceps and biceps  Tender nodule appreciated over the right SI joint. Excessive, symmetric mobility of bilateral  hips. Pain with even mild thigh thrust. No SI joint locking.  Right MTP and CMC joints are easy to disarticulate  MEDICATIONS, LABS & OTHER ORDERS: Previous Medications   AMITRIPTYLINE (ELAVIL) 25 MG TABLET    Take 2 tablets at night   AMITRIPTYLINE (ELAVIL) 25 MG TABLET    Take by mouth.   AMOXICILLIN (AMOXIL) 875 MG TABLET       AMOXICILLIN-CLAVULANATE (AUGMENTIN) 875-125 MG PER TABLET       AZELASTINE (ASTELIN) 0.1 % NASAL SPRAY       CEPHALEXIN (KEFLEX) 500 MG CAPSULE    Take 1 capsule (500 mg total) by mouth 2 (two) times daily.   CEPHALEXIN (KEFLEX) 500 MG CAPSULE    Take by mouth.   CYCLOBENZAPRINE (FLEXERIL) 10 MG TABLET    TAKE HALF TO ONE TAB BY MOUTH THREE TIMES DAILY AS NEEDED FOR MUSCLE SPASMS   DICLOFENAC (CATAFLAM) 50 MG TABLET    Take 50 mg by mouth 2 (two) times daily.   DICLOFENAC (CATAFLAM) 50 MG TABLET    Take by mouth.   DIPHENHYDRAMINE-ACETAMINOPHEN (TYLENOL PM) 25-500 MG TABS    Take 1 tablet by mouth at bedtime as needed.   DOXYCYCLINE (VIBRA-TABS) 100 MG TABLET       FLUCONAZOLE (DIFLUCAN) 150 MG TABLET       GABAPENTIN (NEURONTIN) 300 MG CAPSULE    Take by mouth.   GABAPENTIN (NEURONTIN) 600 MG TABLET  Take 1/2 tablet in the morning and 1/2 in the afternoon and take 1 tablet in the evening.   HYDROCODONE-ACETAMINOPHEN (NORCO/VICODIN) 5-325 MG PER TABLET    Take 1 tablet by mouth every 4 (four) hours as needed for moderate pain.   HYDROCODONE-ACETAMINOPHEN (NORCO/VICODIN) 5-325 MG TABLET    Take by mouth.   HYDROCODONE-ACETAMINOPHEN 5-300 MG TABS    Take one tab q4h prn pain   MELOXICAM (MOBIC) 15 MG TABLET    Take 15 mg by mouth daily.   METHOCARBAMOL (ROBAXIN) 500 MG TABLET    Take 1 tablet (500 mg total) by mouth at bedtime.   METHOCARBAMOL (ROBAXIN) 500 MG TABLET    Take by mouth.   NORGESTREL-ETHINYL ESTRADIOL (CRYSELLE-28) 0.3-30 MG-MCG TABLET    Take active pills daily/continuosly, skip placebo pills   NORGESTREL-ETHINYL ESTRADIOL (CRYSELLE-28) 0.3-30  MG-MCG TABLET       PEG 3350-KCL-NABCB-NACL-NASULF (PEG-3350/ELECTROLYTES) 236 G SOLR       PREDNISONE (DELTASONE) 20 MG TABLET    Take 1 tablet (20 mg total) by mouth 2 (two) times daily with a meal.   TRAMADOL (ULTRAM) 50 MG TABLET    Take 1 tablet (50 mg total) by mouth every 6 (six) hours as needed.   TRAMADOL (ULTRAM) 50 MG TABLET    Take by mouth.   VERAPAMIL (CALAN-SR) 120 MG CR TABLET    Take 120 mg by mouth.   VERAPAMIL (VERELAN PM) 120 MG 24 HR CAPSULE    Take 1 capsule (120 mg total) by mouth at bedtime.   Modified Medications   No medications on file   New Prescriptions   TRAMADOL (ULTRAM) 50 MG TABLET    Take 1 tablet (50 mg total) by mouth 3 (three) times daily as needed.   Discontinued Medications   No medications on file  No orders of the defined types were placed in this encounter.   ASSESSMENT & PLAN: Persistent neck and arm pain following C5-C6 fusion in 07/07/2014 with subsequent ruptured C4-C5 disc.  - She will be increasing her gabapentin to 2100 mg daily. - We will be starting tramadol 50 mg 3 times a day. She is to continue her amitriptyline 25 mg nightly and notably she has not tolerated increase to 50 mg nightly. - We encouraged her to wear her cervical collar as much as possible as she has found relief with this. She may need to wear this during her Pilates sessions. - We will encourage more scapular stabilization exercises as well as isometric neck strengthening.  - Discontinue NSAIDs to side effects. - Patient has appointment with neurosurgeon on 03/07/2015 - Follow up in 4-6 weeks   SI entrapment on the right side: Patient has a tender nodule likely representing a ligamentous tear over the SI on the right side. We performed a trigger point injection. We hope this provides good relief.  We also support the use of a thumb brace she has been provided to help with stabilization of her right CMC and MCP joints at night as they have been disarticulating randomly  requiring relocation when she wakes.   Consent obtained and verified. Sterile betadine prep. Furthur cleansed with alcohol. Topical analgesic spray: Ethyl chloride. Joint: Right SI joint, superficial trigger point injection  Approached in typical fashion with:with palpation Completed without difficulty Meds: 20 mg of Kenalog and 1 mL of 1% lidocaine Needle: 25-gauge 1.5 inch Aftercare instructions and Red flags advised.  43 minutes were spent face-to-face time with the patient will than half of  which was providing counseling as to recommended exercises and therapies required to help stabilize her various hypermobile joints.

## 2015-02-25 NOTE — Patient Instructions (Signed)
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 C4-C5 ruptured disk, left.  - Wear your cervical collar when you begin to fatigue. Bring to Pilates  - Recommend she do resisted shoulder lifts.   - H position & T position - Continue to work on scapular retraction.  - Continue with isometric neck exercises.  - Increase gabapentin 300mg  to 2100mg  as a total daily dose.   - Take tramadol 50mg  3 times a day.  - Discontinue your NSAIDs, voltaren or aleve.  - f/u PRN.

## 2015-03-06 ENCOUNTER — Other Ambulatory Visit: Payer: Self-pay | Admitting: Neurosurgery

## 2015-03-06 ENCOUNTER — Other Ambulatory Visit: Payer: Self-pay | Admitting: *Deleted

## 2015-03-06 DIAGNOSIS — M5030 Other cervical disc degeneration, unspecified cervical region: Secondary | ICD-10-CM

## 2015-03-06 MED ORDER — GABAPENTIN 600 MG PO TABS: ORAL_TABLET | ORAL | Status: DC

## 2015-03-10 ENCOUNTER — Encounter: Payer: Self-pay | Admitting: Sports Medicine

## 2015-03-16 ENCOUNTER — Other Ambulatory Visit: Payer: Managed Care, Other (non HMO)

## 2015-03-19 ENCOUNTER — Encounter: Payer: Self-pay | Admitting: Sports Medicine

## 2015-03-19 ENCOUNTER — Ambulatory Visit (INDEPENDENT_AMBULATORY_CARE_PROVIDER_SITE_OTHER): Payer: Managed Care, Other (non HMO) | Admitting: Sports Medicine

## 2015-03-19 VITALS — BP 127/72 | HR 93 | Ht 59.0 in | Wt 105.0 lb

## 2015-03-19 DIAGNOSIS — M5010 Cervical disc disorder with radiculopathy, unspecified cervical region: Secondary | ICD-10-CM | POA: Diagnosis not present

## 2015-03-19 MED ORDER — PREDNISONE 20 MG PO TABS: 20.0000 mg | ORAL_TABLET | Freq: Two times a day (BID) | ORAL | Status: DC

## 2015-03-19 NOTE — Progress Notes (Signed)
Patient ID: Marissa [name], female   DOB: Jul 28, 1965, 50 y.o.   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 Recovering from recent L4/5 disk after L5-S1 fusion Fusion does not seem to have stabilzed  In any case more pain now Radiates down left arm into hand Describes her QOL as terrible Pain all the time/ drowsy with meds Wants to go to bed by 8:30 but never sleeps well  Currently on meds including Gabapentin 600/ 600/ 900 Amitriptyline 25 Robaxin 500 - at 6 pm Tramadol - usually 2 in later afternoon No hydrocodone use  PExam Sitting with neck collar in place Derpressed appearing/ tearful BP 127/72 mmHg  Pulse 93  Ht 4\' 11"  (1.499 m)  Wt 105 lb (47.628 kg)  BMI 21.20 kg/m2  Pain with motion of left arm Tingling in left hand

## 2015-03-19 NOTE — Assessment & Plan Note (Signed)
Patient here today for discussion of options as she has had failure of treatment to relieve pain and to stabilize the neck  We discussed the challenges of ADL/ she still is trying to work Poor sleep 2/2 neck pain issues  We talked about 2nd neurosurgical opinion  Discussed shift from gabapentin to lyrica Gets some help with robaxin and will doulbe dose at night  We will have her continue the collar Do a 1 week prednisone trial with 20 mg bid Use more tramadol for pain  At end of week if not much relief she is to call and we will switch from gabapentin to lyrica Could go to [facility] or to Dr Carloyn Manner for NS opinion  I spent 30 mins face to face with all the time devoted to counseling and discussion of treatment options for cervical radiculopathy

## 2015-03-20 ENCOUNTER — Encounter: Payer: Self-pay | Admitting: Sports Medicine

## 2015-03-25 ENCOUNTER — Other Ambulatory Visit: Payer: Self-pay | Admitting: *Deleted

## 2015-03-25 ENCOUNTER — Encounter: Payer: Self-pay | Admitting: Sports Medicine

## 2015-03-25 MED ORDER — PREDNISONE 20 MG PO TABS: 20.0000 mg | ORAL_TABLET | Freq: Two times a day (BID) | ORAL | Status: DC

## 2015-03-26 ENCOUNTER — Ambulatory Visit: Payer: Managed Care, Other (non HMO) | Admitting: Sports Medicine

## 2015-03-31 ENCOUNTER — Encounter: Payer: Self-pay | Admitting: *Deleted

## 2015-03-31 NOTE — Patient Instructions (Signed)
Faxed all pertaining records to Dr Roy's office at [phone] and he will review them and call pt with an appt time and date  Office information is:  Dr Glenna [facility] [address] [place], [place] [address] Phone: [phone]  Epic messaged the patient the above information

## 2015-04-14 ENCOUNTER — Encounter: Payer: Self-pay | Admitting: Sports Medicine

## 2015-04-21 ENCOUNTER — Ambulatory Visit: Payer: Managed Care, Other (non HMO) | Admitting: Sports Medicine

## 2015-04-29 ENCOUNTER — Ambulatory Visit (INDEPENDENT_AMBULATORY_CARE_PROVIDER_SITE_OTHER): Payer: Managed Care, Other (non HMO) | Admitting: Sports Medicine

## 2015-04-29 ENCOUNTER — Encounter: Payer: Self-pay | Admitting: Sports Medicine

## 2015-04-29 VITALS — BP 123/69 | Ht 58.5 in | Wt 108.0 lb

## 2015-04-29 DIAGNOSIS — F32A Depression, unspecified: Secondary | ICD-10-CM

## 2015-04-29 DIAGNOSIS — M5010 Cervical disc disorder with radiculopathy, unspecified cervical region: Secondary | ICD-10-CM | POA: Diagnosis not present

## 2015-04-29 DIAGNOSIS — F329 Major depressive disorder, single episode, unspecified: Secondary | ICD-10-CM

## 2015-04-29 DIAGNOSIS — Q796 Ehlers-Danlos syndrome: Secondary | ICD-10-CM

## 2015-04-29 DIAGNOSIS — Q7962 Hypermobile Ehlers-Danlos syndrome: Secondary | ICD-10-CM

## 2015-04-29 MED ORDER — GABAPENTIN 300 MG PO CAPS: 300.0000 mg | ORAL_CAPSULE | Freq: Three times a day (TID) | ORAL | Status: DC

## 2015-04-29 MED ORDER — METHOCARBAMOL 500 MG PO TABS: ORAL_TABLET | ORAL | Status: DC

## 2015-04-29 MED ORDER — TRAMADOL HCL 50 MG PO TABS: 50.0000 mg | ORAL_TABLET | Freq: Two times a day (BID) | ORAL | Status: DC

## 2015-04-29 MED ORDER — MELOXICAM 7.5 MG PO TABS: 7.5000 mg | ORAL_TABLET | Freq: Every day | ORAL | Status: DC

## 2015-04-29 MED ORDER — DOXEPIN HCL 50 MG PO CAPS: 50.0000 mg | ORAL_CAPSULE | Freq: Every day | ORAL | Status: DC

## 2015-04-29 NOTE — Progress Notes (Signed)
Patient ID: Marissa [name], female   DOB: 01-17-1966, 50 y.o.   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 Rita Ohara with subsequent C4-5 disc herniation     -- Fusion does not seem to have stabilized - Her pain continues to radiate from her lateral neck to her lateral deltoid & feels like a tight blood pressure cuff. She does have pain in her ulnar hand although it is not a burning pain. - She has significant trouble sleeping. - Recently had a second opinion by Dr. Carloyn Manner, neurosurgery. He did not feel like a revision surgery/microdiscectomy is called for at this point.     -- Dr. Carloyn Manner has recommended mobic, tramadol, and a muscle relaxant.     -- Dr. Carloyn Manner agrees with strengthening the cervical stabilizing muscles     -- He also identified significant slippage with flexion and extension - She continues to struggle with neck pain, especially at the end of the week. She feels exhausted by the end of a workday and work week. - He is currently taking tramadol when necessary, muscle relaxer, gabapentin 2100 mg. She does not notice a great benefit from the gabapentin. - She also takes amitriptyline, 25 mg in the evening. She has not tolerated 50 mg. - She is struggling with her situation emotionally. She has a therapist and denies any current suicidal ideation or plan. She does wonder what exactly the future will hold. - She has returned to Pilates/Physical therapy which does help quite a bit.  She is quite distraught by her situation. She is seeing a therapist on a regular basis who she has a good relationship with. She denies any active suicidality.  PExam Derpressed appearing/ tearful BP 123/69 mmHg  Ht 4' 10.5" (1.486 m)  Wt 108 lb (48.988 kg)  BMI 22.18 kg/m2  Pain with motion of left arm Tingling in left hand  Morton's changes and bilateral feet with bunionettes. 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 We discussed her medication regimen and are going to try to make several changes. - She does not feel like she has received benefit from the gabapentin. She will  titrate it down from 2100 mg total daily dose as far as she can go decreasing it every 5 days by 300 mg. - Robaxin will be increased to 1 when she gets home from work and one before bed. - She will continue with Pilates and may consider increasing the frequency if she can tolerate this. Her goal is to strengthen the cervical paraspinal muscles. - We are going to restart her on an insulin, Mobic 7.5 mg daily. She had no GI bleed but did have some stomach discomfort with diclofenac in the past. She will be mindful of side effects and trying to take it with food and water. 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 We're to transition her from amitriptyline 25 mg daily at bedtime to doxepin 50 mg daily at bedtime. She has a history of serotonin syndrome so we are trying to avoid a secondary medication. Her initial episode which sounds consistent with a mild serotonin syndrome occurred with Cymbalta. - Recommend that she wear her cervical  collar at the end of the day if she feels fatigued. - We will see her back in 6-8 weeks as needed.

## 2015-04-30 ENCOUNTER — Encounter: Payer: Self-pay | Admitting: Sports Medicine

## 2015-04-30 ENCOUNTER — Encounter: Payer: Self-pay | Admitting: *Deleted

## 2015-04-30 ENCOUNTER — Other Ambulatory Visit: Payer: Self-pay | Admitting: *Deleted

## 2015-04-30 MED ORDER — METHOCARBAMOL 500 MG PO TABS: ORAL_TABLET | ORAL | Status: DC

## 2015-06-10 ENCOUNTER — Other Ambulatory Visit: Payer: Self-pay | Admitting: *Deleted

## 2015-06-10 ENCOUNTER — Ambulatory Visit (INDEPENDENT_AMBULATORY_CARE_PROVIDER_SITE_OTHER): Payer: Managed Care, Other (non HMO) | Admitting: Family Medicine

## 2015-06-10 ENCOUNTER — Encounter: Payer: Self-pay | Admitting: Family Medicine

## 2015-06-10 VITALS — BP 131/78 | Ht 58.5 in | Wt 108.0 lb

## 2015-06-10 DIAGNOSIS — M533 Sacrococcygeal disorders, not elsewhere classified: Principal | ICD-10-CM

## 2015-06-10 DIAGNOSIS — G8929 Other chronic pain: Secondary | ICD-10-CM | POA: Diagnosis not present

## 2015-06-10 MED ORDER — METHYLPREDNISOLONE ACETATE 40 MG/ML IJ SUSP
40.0000 mg | Freq: Once | INTRAMUSCULAR | Status: AC
  Administered 2015-06-10: 40 mg via INTRA_ARTICULAR

## 2015-06-10 NOTE — Assessment & Plan Note (Signed)
Exacerbation of underlying problem.  Do truly feel this is SI joint related.  Injection today, continue with medications.   Aspiration/Injection Procedure Note ZANARI EISENSTEIN 01/11/1966  Procedure: Injection Indications: SI joint R   Procedure Details Consent: Risks of procedure as well as the alternatives and risks of each were explained to the (patient/caregiver).  Consent for procedure obtained. Time Out: Verified patient identification, verified procedure, site/side was marked, verified correct patient position, special equipment/implants available, medications/allergies/relevent history reviewed, required imaging and test results available.  Performed.  The area was cleaned with iodine and alcohol swabs.    The R SI joint was injected using 1 cc's of 40mg  Depomedrol and 1 cc's of 1% lidocaine with a 21 1 1/2" needle.  Ultrasound was used. Images were obtained in Transverse and Long views showing the injection.    A sterile dressing was applied.  Patient did tolerate procedure well. Estimated blood loss: None

## 2015-06-10 NOTE — Progress Notes (Signed)
  Marissa [name] - 50 y.o. female MRN [id number]  Date of birth: 1965/11/11 Marissa [name] is a 50 y.o. female who presents today for right posterior hip pain.  Right posterior hip pain, follow-up visit 06/10/15-patient presents today with ongoing right posterior hip pain localized to the SI joint for the past week. This is exacerbated the past couple days despite taking multiple medications including Mobic/Neurontin/Tylenol/tramadol. Pain is now to the point where she has trouble sitting. Denies any paresthesias going down her leg or pain with bending over.  PMHx - Updated and reviewed.  Contributory factors include: EDS PSHx - Updated and reviewed.  Contributory factors include:  Shoulder arthroscopy and knee arthroscopy FHx - Updated and reviewed.  Contributory factors include:  Arthritis maternal and paternal Social Hx - Updated and reviewed. Contributory factors include: Nonsmoker  Medications - updated and reviewed   ROS Per HPI.  12 point negative other than per HPI.   Exam:  Filed Vitals:   06/10/15 1429  BP: 131/78    Gen: NAD, AAO 3 Cardio- RRR Pulm - Normal respiratory effort/rate Skin: No rashes or erythema Extremities: No edema  Vascular: pulses +2 bilateral upper and lower extremity Psych: Normal affect  RHip Exam:  Pelvic alignment unremarkable to inspection and palpation. Standing hip rotation and gait without trendelenburg / unsteadiness. Greater trochanter without tenderness to palpation. No tenderness over piriformis and greater trochanter. No SI joint tenderness and normal minimal SI movement. ROM: IR: 80 Deg, ER: 80 Deg, Flexion: 120 Deg, Extension: 100 Deg, Abduction: 45 Deg, Adduction: 45 Deg Strength:  IR: 5/5, ER: 5/5, Flexion (0 and 90 degrees): 5/5, Extension: 5/5, Abduction: 5/5, Adduction: 5/5 Negative Thomas test  Negative FADIR.  Negative FADIR with axial compression Negative FAIR and Freiberg  +FABER in posterior directions, + posterior shear, negative  Gaenslen   Neurovascularly intact B/L LE

## 2015-06-18 ENCOUNTER — Ambulatory Visit (INDEPENDENT_AMBULATORY_CARE_PROVIDER_SITE_OTHER): Payer: Managed Care, Other (non HMO) | Admitting: Sports Medicine

## 2015-06-18 ENCOUNTER — Encounter: Payer: Self-pay | Admitting: Sports Medicine

## 2015-06-18 VITALS — BP 114/78 | HR 97 | Ht 58.5 in | Wt 108.0 lb

## 2015-06-18 DIAGNOSIS — M533 Sacrococcygeal disorders, not elsewhere classified: Secondary | ICD-10-CM

## 2015-06-18 DIAGNOSIS — G8929 Other chronic pain: Secondary | ICD-10-CM | POA: Diagnosis not present

## 2015-06-18 NOTE — Progress Notes (Signed)
Patient ID: Marissa [name], female   DOB: Jun 11, 1965, 50 y.o.   MRN: [id number]  SI Joint pain markedly worse for 2 weeks/ Fredderick Phenix Danlos type III patient 8 days ago SI injection - no relief/ same lack of relief when ibazebo did this in past Has an SI belt but that bothers her hip Tried bike shorts  She is taking gabapentin 600 twice a day Tramadol 50 ----3 times a day which is more than she usually needs Mobic in the mornings Robaxin twice a day  The pain is worse with sitting She is standing for most meeting Getting up out of a chair or lifting her leg causes pain at the right SI joint  Past history Ocular migraines Recent cervical disc herniation  Review of systems No sciatica but tingling and hypersensitivity down both lateral legs more so on the right Right hip pain that feels different more located in the groin and with movement Burning sensation to her left arm  Physical examination Thin female in some obvious discomfort and prefers standing BP 114/78 mmHg  Pulse 97  Ht 4' 10.5" (1.486 m)  Wt 108 lb (48.988 kg)  BMI 22.18 kg/m2  Examination of pelvic position reveals of the pelvis is slightly anteriorly tilted but is and can't even position bilaterally Lumbar flexion shows normal SI joint motion Extension of the back creates the pain just below the right SI joint Hip rotation shows normal SI joint motion 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

## 2015-06-18 NOTE — Patient Instructions (Signed)
Use pain patches with 4% lidocaine in them  Also can use the voltaren gel, about 4 times a day, rub into the low back area of pain  Increase Robaxin to 3-4 tablets a day  Increase the Doxapin to 50mg  a day  Follow up in 2 weeks

## 2015-06-18 NOTE — Assessment & Plan Note (Signed)
While her pain is localized near the SI joint I don't think it's related to SI joint malposition or subluxation  With her hypermobility she said a significant strain of the attachment of the gluteus max tendon into the area just below the SI joint  We will use some home exercises to emphasize easy flexion and extension of the low back Trial with topical patches with lidocaine 4% Continue her oral medications Some Voltaren gel into the muscle belly Continue ice if she is getting relief  Recheck in 2 weeks/ not a candidate for nitroglycerin because of migraines

## 2015-06-29 ENCOUNTER — Other Ambulatory Visit: Payer: Self-pay | Admitting: *Deleted

## 2015-06-29 MED ORDER — METHOCARBAMOL 500 MG PO TABS: ORAL_TABLET | ORAL | Status: DC

## 2015-07-02 ENCOUNTER — Encounter: Payer: Self-pay | Admitting: Sports Medicine

## 2015-07-02 ENCOUNTER — Ambulatory Visit (INDEPENDENT_AMBULATORY_CARE_PROVIDER_SITE_OTHER): Payer: Managed Care, Other (non HMO) | Admitting: Sports Medicine

## 2015-07-02 VITALS — BP 115/62 | Ht 58.5 in | Wt 108.0 lb

## 2015-07-02 DIAGNOSIS — Q7962 Hypermobile Ehlers-Danlos syndrome: Secondary | ICD-10-CM

## 2015-07-02 DIAGNOSIS — M533 Sacrococcygeal disorders, not elsewhere classified: Secondary | ICD-10-CM

## 2015-07-02 DIAGNOSIS — G8929 Other chronic pain: Secondary | ICD-10-CM | POA: Diagnosis not present

## 2015-07-02 DIAGNOSIS — Q796 Ehlers-Danlos syndrome: Secondary | ICD-10-CM

## 2015-07-02 NOTE — Assessment & Plan Note (Signed)
Continues to have significant pain posterior right hip. Has tried multiple injections as well as topical medications with minimal relief. This point I think physical therapy for stabilization of the surrounding musculature as well as soft tissue modalities may be warranted. Discussed that this may or may not work with the patient and she is understanding of that.

## 2015-07-02 NOTE — Progress Notes (Signed)
  LAREINA OQUIN - 50 y.o. female MRN [id number]  Date of birth: 08/22/1965 GENEVEVE FORTUNO is a 50 y.o. female who presents today for right posterior hip pain.  Right posterior hip pain, follow-up visit 06/10/15-patient presents today with ongoing right posterior hip pain localized to the SI joint for the past week. This is exacerbated the past couple days despite taking multiple medications including Mobic/Neurontin/Tylenol/tramadol. Pain is now to the point where she has trouble sitting. Denies any paresthesias going down her leg or pain with bending over.  R posterior hip pain f/u 07/02/15 - patient with continued right posterior hip pain. She previously had SI injection about 3 weeks ago with minimal to no relief. As well was seen in follow-up last week with ultrasound which showed possible hypoechoic tearing and gluteus maximus insertion. She hasn't been unable to do Pilates stretching and continues to be in substantial pain. Unable to do nitroglycerin secondary to occipital migraines.  Continue with lidocaine and Voltaren gel which has helped somewhat.  PMHx - Updated and reviewed.  Contributory factors include: EDS PSHx - Updated and reviewed.  Contributory factors include:  Shoulder arthroscopy and knee arthroscopy FHx - Updated and reviewed.  Contributory factors include:  Arthritis maternal and paternal Social Hx - Updated and reviewed. Contributory factors include: Nonsmoker  Medications - updated and reviewed   ROS Per HPI.  12 point negative other than per HPI.   Exam:  Filed Vitals:   07/02/15 0836  BP: 115/62    Gen: NAD, AAO 3 Cardio- RRR Pulm - Normal respiratory effort/rate Skin: No rashes or erythema Extremities: No edema  Vascular: pulses +2 bilateral upper and lower extremity Psych: Normal affect  RHip Exam:  Pelvic alignment unremarkable to inspection and palpation. Standing hip rotation and gait without trendelenburg / unsteadiness. Greater trochanter without tenderness  to palpation. No tenderness over piriformis and greater trochanter. No SI joint tenderness and normal minimal SI movement. ROM: IR: 80 Deg, ER: 80 Deg, Flexion: 120 Deg, Extension: 100 Deg, Abduction: 45 Deg, Adduction: 45 Deg Strength:  IR: 5/5, ER: 5/5, Flexion (0 and 90 degrees): 5/5, Extension: 5/5, Abduction: 5/5, Adduction: 5/5 Negative Thomas test  Negative FADIR.  Negative FADIR with axial compression Negative FAIR and Fre[place]erg  +FABER in posterior directions, + posterior shear, negative Gaenslen   Neurovascularly intact B/L LE

## 2015-07-05 NOTE — Assessment & Plan Note (Signed)
WE are going to refer her to try massage and soft tissue work She has to cont limiting exercise 2/2 worsening pain No change in meds

## 2015-07-30 ENCOUNTER — Other Ambulatory Visit: Payer: Self-pay | Admitting: Family Medicine

## 2015-07-30 DIAGNOSIS — Z1231 Encounter for screening mammogram for malignant neoplasm of breast: Secondary | ICD-10-CM

## 2015-08-04 ENCOUNTER — Encounter: Payer: Self-pay | Admitting: Sports Medicine

## 2015-08-04 ENCOUNTER — Ambulatory Visit (INDEPENDENT_AMBULATORY_CARE_PROVIDER_SITE_OTHER): Payer: Managed Care, Other (non HMO) | Admitting: Sports Medicine

## 2015-08-04 VITALS — BP 119/73 | HR 95 | Ht 58.5 in | Wt 108.0 lb

## 2015-08-04 DIAGNOSIS — G8929 Other chronic pain: Secondary | ICD-10-CM

## 2015-08-04 DIAGNOSIS — M5010 Cervical disc disorder with radiculopathy, unspecified cervical region: Secondary | ICD-10-CM

## 2015-08-04 DIAGNOSIS — M533 Sacrococcygeal disorders, not elsewhere classified: Secondary | ICD-10-CM | POA: Diagnosis not present

## 2015-08-04 NOTE — Assessment & Plan Note (Signed)
Cont to wok on posture  Rest has gradually reduced pain  Will try to wean off gabapentin

## 2015-08-04 NOTE — Assessment & Plan Note (Signed)
Some improvement w less pain  Cont easy walking  Gradual inc in other activity - Pilates w no resistance  Reck 2 mos

## 2015-08-04 NOTE — Progress Notes (Signed)
Patient ID: Marissa [name], female   DOB: [birth year], 50 y.o.   MRN: [id number]  CC: ED 3 with neck and R SIJ pain  Gradually doing better Now able to sit with no real back pain Neck is less painful No sxs radiating to arm No U Ext or hand weakness Able to return to some easy Pilates activity but not using resistance  Doxepin has helped sleep Robaxin is helpful Tramadol - usually 1 at hs and 1/2 during day Mobic - off now was getting GI irritation Gabapentin 300 hs - weaning has not caused more pain Lidocaine patches over SIJ  ROS No POTS sxs recently No shoulder subluxation No sciatic sxs  PE NAD BP 119/73 mmHg  Pulse 95  Ht 4' 10.5" (1.486 m)  Wt 108 lb (48.988 kg)  BMI 22.18 kg/m2   Neck - good posture and movement without pain Norm arm and shoulder movement Shoulder motion is full with no scap dysfunction  Spine - no perispinous spasm Hip - good rotation FABER - pain to RT SIJ SIJ movement is good

## 2015-08-05 ENCOUNTER — Other Ambulatory Visit: Payer: Self-pay | Admitting: *Deleted

## 2015-08-05 MED ORDER — DOXEPIN HCL 50 MG PO CAPS: 50.0000 mg | ORAL_CAPSULE | Freq: Every day | ORAL | Status: DC

## 2015-08-06 ENCOUNTER — Ambulatory Visit: Payer: Managed Care, Other (non HMO) | Admitting: Sports Medicine

## 2015-08-12 ENCOUNTER — Other Ambulatory Visit: Payer: Self-pay | Admitting: *Deleted

## 2015-08-12 MED ORDER — MELOXICAM 7.5 MG PO TABS: 7.5000 mg | ORAL_TABLET | Freq: Every day | ORAL | 3 refills | Status: DC

## 2015-08-18 ENCOUNTER — Encounter: Payer: Self-pay | Admitting: Sports Medicine

## 2015-08-26 ENCOUNTER — Ambulatory Visit
Admission: RE | Admit: 2015-08-26 | Discharge: 2015-08-26 | Disposition: A | Discharge status: Home / Self Care | Payer: Managed Care, Other (non HMO) | Source: Ambulatory Visit | Attending: Family Medicine | Admitting: Family Medicine

## 2015-08-26 DIAGNOSIS — Z1231 Encounter for screening mammogram for malignant neoplasm of breast: Secondary | ICD-10-CM

## 2015-09-04 ENCOUNTER — Telehealth: Payer: Self-pay | Admitting: Cardiology

## 2015-09-04 NOTE — Telephone Encounter (Signed)
Please call and schedule this pt a New Patient appt on Dr Francesca Oman schedule at her next available appt.   Should be around end of October or beginning of November.  This is per Dr [name].  Nursing assessment not needed, just an appt needed.

## 2015-09-04 NOTE — Telephone Encounter (Signed)
New Message  Pt voiced her PCP/MD Jenene Slicker spoke with MD [name] this morning and pt was informed by MD Randall Hiss to call to speak with MD Nelson's nurse for an appt.  Pt also voiced she will be coming in as a new pt.  Please follow up with pt. Thanks!

## 2015-10-01 ENCOUNTER — Ambulatory Visit (INDEPENDENT_AMBULATORY_CARE_PROVIDER_SITE_OTHER): Payer: Managed Care, Other (non HMO) | Admitting: Sports Medicine

## 2015-10-01 ENCOUNTER — Encounter: Payer: Self-pay | Admitting: Sports Medicine

## 2015-10-01 DIAGNOSIS — M5010 Cervical disc disorder with radiculopathy, unspecified cervical region: Secondary | ICD-10-CM

## 2015-10-01 DIAGNOSIS — G8929 Other chronic pain: Secondary | ICD-10-CM

## 2015-10-01 DIAGNOSIS — M533 Sacrococcygeal disorders, not elsewhere classified: Secondary | ICD-10-CM | POA: Diagnosis not present

## 2015-10-01 MED ORDER — DOXEPIN HCL 25 MG PO CAPS: 25.0000 mg | ORAL_CAPSULE | Freq: Every day | ORAL | 6 refills | Status: DC

## 2015-10-01 NOTE — Progress Notes (Signed)
   [facility] Phone: [phone]  Subjective:  Marissa [name] is a 50 year old female with a PMH of Ehlers-Danlos who presents for follow-up of chronic musculoskeletal complaints including left shoulder pain and right buttocks/hip pain.  Left shoulder pain: She describes the pain as "neuropathic pain". The pain starts on the left side of her neck and radiates to her trapezius muscle and down the side of her torso. The pain worsened acutely after receiving a massage. She has been going to Pilates and has been using Doxepin and Robaxin at home. She also has a prescription for Tramadol, but has not really been using this. She stopped taking the Gabapentin because it wasn't helping. The pain is worse with moving her neck. No recent shoulder subluxation.  Right buttocks/hip pain: She has been having posterior upper right buttocks pain that radiates to the outside of her right hip. The pain does not radiate down her leg. She describes the pain as "pulling". The pain is worse when she rolls over at night. She has tried Lidocaine patches over her buttocks and hip, but this has not helped.  WE saw injury to Glut med on [place] in past.  ROS: See HPI for pertinent positives and negatives  Past Medical History- Ehlers-Danlos with POTS, cervical disc disorder s/p fusion surgery  Family history reviewed for today's visit. No changes.  Social history- patient is a never smoker  Objective: BP 110/70  Gen: NAD, alert, cooperative with exam Neck: Full ROM Trapezius spasm noted bilaterally Mild neck forward position Full range of motion of the left shoulder without a painful ARC  Right hip shows a mild anterior rotation of the pelvis Right SI joint is tight to movement Tenderness to palpation over the upper gluteus medius insertion Mild tenderness at the superior greater trochanteric area  Assessment/Plan: See problem based a/p   Hyman Bible, MD PGY-2  I observed and examined the  patient with the resident and agree with assessment and plan.  Note reviewed and modified by me.  Ila Mcgill, MD

## 2015-10-01 NOTE — Patient Instructions (Addendum)
Exercises to try: -Shake-outs with arms 15 swings, 3 times per day. -Simple work on [employer] with the same motion -Lateral leg motion 15 times, 3 sets, then 5 isometrics for 10 seconds each -T stretches and isometric neck extension  Start Vitamin B6 (either 50mg  or 100mg ) Add Doxepin 25mg  during the day, in addition to 50mg  at night.

## 2015-10-01 NOTE — Assessment & Plan Note (Signed)
I suspect the neurogenic irritation from her disc problems causes some chronic trapezius spasm  Shakeout exercises Vitamin B6 100 mg a day Work on specific physical therapy and Pilates techniques  Recheck in 3 months

## 2015-10-01 NOTE — Assessment & Plan Note (Signed)
Most symptoms now are with the gluteus medius muscle However the right SI joint does seem to be slightly subluxed We will resume the exercise program Try adding 25 mg of doxepin during the day I suggested adding some isometrics for hip abduction as well as standing hip abduction

## 2015-10-21 ENCOUNTER — Telehealth: Payer: Self-pay

## 2015-10-21 ENCOUNTER — Encounter: Payer: Self-pay | Admitting: Cardiology

## 2015-10-21 NOTE — Telephone Encounter (Signed)
I called pt to find out who referred her here or who her pcp is. A telephone note on 09/04/15 10:11 AM done by Alan Mulder states that Pt voiced her PCP/MD Jenene Slicker spoke with MD [name] this morning and pt was informed by MD Randall Hiss to call to speak  with MD Nelson's nurse for an appt. Pt also voiced she will be coming in as a new pt. But then this was put in  ENT Dr Thornell Mule and  Dr [name] [place] her to be seen for a connective tissue disorder which will need cardiology to be involved to follow pt per Ivy/sf. When [facility] called Dr Thornell Mule office to get notes they said that they had not seen pt in three years . So until pt calls [place] back we do not know where to get notes for this patient.

## 2015-10-27 ENCOUNTER — Ambulatory Visit: Payer: Managed Care, Other (non HMO) | Admitting: Cardiology

## 2015-11-04 ENCOUNTER — Encounter: Payer: Self-pay | Admitting: Cardiology

## 2015-11-04 ENCOUNTER — Ambulatory Visit (INDEPENDENT_AMBULATORY_CARE_PROVIDER_SITE_OTHER): Payer: Managed Care, Other (non HMO) | Admitting: Cardiology

## 2015-11-04 VITALS — BP 122/80 | HR 98 | Ht <= 58 in | Wt 107.6 lb

## 2015-11-04 DIAGNOSIS — Q7962 Hypermobile Ehlers-Danlos syndrome: Secondary | ICD-10-CM

## 2015-11-04 DIAGNOSIS — Z8249 Family history of ischemic heart disease and other diseases of the circulatory system: Secondary | ICD-10-CM

## 2015-11-04 DIAGNOSIS — E782 Mixed hyperlipidemia: Secondary | ICD-10-CM | POA: Diagnosis not present

## 2015-11-04 DIAGNOSIS — R000 Tachycardia, unspecified: Secondary | ICD-10-CM

## 2015-11-04 DIAGNOSIS — Q796 Ehlers-Danlos syndrome: Secondary | ICD-10-CM | POA: Diagnosis not present

## 2015-11-04 DIAGNOSIS — R002 Palpitations: Secondary | ICD-10-CM | POA: Diagnosis not present

## 2015-11-04 DIAGNOSIS — I4711 Inappropriate sinus tachycardia, so stated: Secondary | ICD-10-CM

## 2015-11-04 DIAGNOSIS — D352 Benign neoplasm of pituitary gland: Secondary | ICD-10-CM

## 2015-11-04 LAB — COMPREHENSIVE METABOLIC PANEL
ALT: 13 U/L (ref 6–29)
AST: 21 U/L (ref 10–35)
Albumin: 4.1 g/dL (ref 3.6–5.1)
Alkaline Phosphatase: 31 U/L — ABNORMAL LOW (ref 33–130)
BUN: 9 mg/dL (ref 7–25)
CO2: 22 mmol/L (ref 20–31)
Calcium: 9.3 mg/dL (ref 8.6–10.4)
Chloride: 106 mmol/L (ref 98–110)
Creat: 0.84 mg/dL (ref 0.50–1.05)
Glucose, Bld: 84 mg/dL (ref 65–99)
Potassium: 3.9 mmol/L (ref 3.5–5.3)
Sodium: 138 mmol/L (ref 135–146)
Total Bilirubin: 0.3 mg/dL (ref 0.2–1.2)
Total Protein: 7.1 g/dL (ref 6.1–8.1)

## 2015-11-04 NOTE — Patient Instructions (Signed)
Medication Instructions:   Your physician recommends that you continue on your current medications as directed. Please refer to the Current Medication list given to you today.'   Labwork:  TODAY--CMET AND NMR WITH LIPIDS [id number])    Testing/Procedures:  Your physician has requested that you have an echocardiogram. Echocardiography is a painless test that uses sound waves to create images of your heart. It provides your doctor with information about the size and shape of your heart and how well your heart's chambers and valves are working. This procedure takes approximately one hour. There are no restrictions for this procedure.  Your physician has recommended that you wear a 24 HR holter monitor. Holter monitors are medical devices that record the heart's electrical activity. Doctors most often use these monitors to diagnose arrhythmias. Arrhythmias are problems with the speed or rhythm of the heartbeat. The monitor is a small, portable device. You can wear one while you do your normal daily activities. This is usually used to diagnose what is causing palpitations/syncope (passing out).    Follow-Up:  Your physician wants you to follow-up in: [place] will receive a reminder letter in the mail two months in advance. If you don't receive a letter, please call our office to schedule the follow-up appointment.        If you need a refill on your cardiac medications before your next appointment, please call your pharmacy.

## 2015-11-04 NOTE — Progress Notes (Signed)
Cardiology Office Note    Date:  11/05/2015   ID:  Marissa [name], DOB [birth year], MRN [id number]  PCP:  Osborne Casco, MD  Cardiologist:  Ena Dawley, MD   Chief complain: Establish cardiology care with dg of Ehlers-Danlos syndrome  History of Present Illness:  Marissa [name] is a 50 y.o. 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  The patient has a diagnosis of Ehlers-Danlos syndrome, she has tremendous orthopedic problems followed by Dr Stefanie Libel.   She has been experiencing palpitations and states that her heart rate is always high despite good hydration. She is not active in sports as she is limited with orthopedic issues, but walks most days without SOB or chest pain. No syncope.   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  She had an echocardiogram approximately 10 years ago and was told it was normal.   She has never smoked, doesn't have any children.   Past Medical History:  Diagnosis Date  . Ehlers-Danlos syndrome   . History of pituitary tumor   . Ocular migraine     Past Surgical History:  Procedure Laterality Date  . KNEE SURGERY N/A 2012  . SHOULDER SURGERY N/A 2009  . UTERINE FIBROID SURGERY N/A     Current Medications: Outpatient Medications Prior to Visit  Medication Sig Dispense Refill  . diclofenac (CATAFLAM) 50 MG tablet Take 50 mg by mouth 2 (two) times daily. Reported on 08/04/2015  3  . diphenhydramine-acetaminophen (TYLENOL PM) 25-500 MG TABS Take 1 tablet by mouth at bedtime as needed. Reported on 08/04/2015    . doxepin (SINEQUAN) 25 MG capsule Take 1 capsule (25 mg total) by mouth daily. 30 capsule 6  . methocarbamol (ROBAXIN) 500 MG tablet Take twice a day as needed. 30 tablet 3  . norgestrel-ethinyl estradiol (CRYSELLE-28) 0.3-30 MG-MCG tablet Take active pills  daily/continuosly, skip placebo pills    . traMADol (ULTRAM) 50 MG tablet Take 1 tablet (50 mg total) by mouth 2 (two) times daily. Can take additional doses as needed. 100 tablet 2  . amoxicillin-clavulanate (AUGMENTIN) 875-125 MG per tablet Reported on 08/04/2015    . azelastine (ASTELIN) 0.1 % nasal spray     . cephALEXin (KEFLEX) 500 MG capsule Take 1 capsule (500 mg total) by mouth 2 (two) times daily. (Patient not taking: Reported on 08/04/2015) 20 capsule 0  . doxepin (SINEQUAN) 50 MG capsule Take 1 capsule (50 mg total) by mouth at bedtime. 30 capsule 5  . doxycycline (VIBRA-TABS) 100 MG tablet     . fluconazole (DIFLUCAN) 150 MG tablet     . gabapentin (NEURONTIN) 300 MG capsule Take 1 capsule (300 mg total) by mouth 3 (three) times daily. Taper down dose of 2100mg , every 5 days, go down 300mg . 90 capsule 2  . HYDROcodone-acetaminophen (NORCO/VICODIN) 5-325 MG per tablet Take 1 tablet by mouth every 4 (four) hours as needed for moderate pain. 30 tablet 0  . meloxicam (MOBIC) 7.5 MG tablet Take 1 tablet (7.5 mg total) by mouth daily. 30 tablet 3  . PEG 3350-KCl-NaBcb-NaCl-NaSulf (PEG-3350/ELECTROLYTES) 236 G SOLR     . verapamil (CALAN-SR) 120 MG CR tablet Take 120 mg by mouth.     No facility-administered medications prior to visit.      Allergies:   Macrobid [nitrofurantoin monohyd macro]  Social History   Social History  . Marital status: Single    Spouse name: N/A  . Number of children: 0  . Years of education: Masters   Occupational History  .  [employer]   Social History Main Topics  . Smoking status: Never Smoker  . Smokeless tobacco: Never Used  . Alcohol use No  . Drug use: No  . Sexual activity: Yes    Birth control/ protection: Pill   Other Topics Concern  . None   Social History Narrative  . None     Family History:  The patient's family history includes Arthritis in her father and mother; Atrial fibrillation in her mother; Diabetes in her  father; Heart disease in her father; Heart failure in her father; Hypertension in her father.   ROS:   Please see the history of present illness.    ROS All other systems reviewed and are negative.   PHYSICAL EXAM:   VS:  BP 122/80 (BP Location: Left Arm)   Pulse 98   Ht 4\' 10"  (1.473 m)   Wt 107 lb 9.6 oz (48.8 kg)   SpO2 99%   BMI 22.49 kg/m    GEN: Well nourished, well developed, in no acute distress  HEENT: normal  Neck: no JVD, carotid bruits, or masses Cardiac: RRR; 1/6 systolic murmurs, rubs, or gallops,no edema  Respiratory:  clear to auscultation bilaterally, normal work of breathing GI: soft, nontender, nondistended, + BS MS: no deformity or atrophy  Skin: warm and dry, no rash Neuro:  Alert and Oriented x 3, Strength and sensation are intact Psych: euthymic mood, full affect  Wt Readings from Last 3 Encounters:  11/04/15 107 lb 9.6 oz (48.8 kg)  10/01/15 108 lb (49 kg)  08/04/15 108 lb (49 kg)      Studies/Labs Reviewed:   EKG:  EKG demonstrates NSR, low voltage ECG  Recent Labs: 11/04/2015: ALT 13; BUN 9; Creat 0.84; Potassium 3.9; Sodium 138   Lipid Panel No results found for: CHOL, TRIG, HDL, CHOLHDL, VLDL, LDLCALC, LDLDIRECT     ASSESSMENT:    1. Ehlers-Danlos syndrome, type 3   2. Palpitations   3. Inappropriate sinus tachycardia   4. 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 Mixed hyperlipidemia     PLAN:  In order of problems listed above:  1. The patient carries a diagnosis of Ehlers-Danlos syndrome, a connective tissue disorder associated with valvular disease - such as mitral valve prolapse, mitral and aortic regurgitation and aortic dissection. She is asymptomatic and had normal echocardiogram approximately 10 years ago. We will repeat Physical exam shows only very mild systolic murmur.If normal we will follow bi-annualy. 2. Palpitation, sinus tachycardia, most probably inappropriate sinus tachycardia, she is very careful  about adequate hydration, we will start 24 hour Holter monitoring and if proved, we will start a low dose metoprolol as she didn't tolerate CCB (verapamil) in t he past sec to hypotension. In the future, if approved for inappropriate sinus tachycardia, ivabradine would be a great option for her.  3. Hyperlipidemia - h/o and also in her father, father had MI in his 81', we will check and treat appropriately.   Medication Adjustments/Labs and Tests Ordered: Current medicines are reviewed at length with the patient today.  Concerns regarding medicines are outlined above.  Medication changes, Labs and Tests ordered today are listed in the Patient Instructions below. Patient Instructions  Medication Instructions:   Your physician recommends that you continue on  your current medications as directed. Please refer to the Current Medication list given to you today.'   Labwork:  TODAY--CMET AND NMR WITH LIPIDS [id number])    Testing/Procedures:  Your physician has requested that you have an echocardiogram. Echocardiography is a painless test that uses sound waves to create images of your heart. It provides your doctor with information about the size and shape of your heart and how well your heart's chambers and valves are working. This procedure takes approximately one hour. There are no restrictions for this procedure.  Your physician has recommended that you wear a 24 HR holter monitor. Holter monitors are medical devices that record the heart's electrical activity. Doctors most often use these monitors to diagnose arrhythmias. Arrhythmias are problems with the speed or rhythm of the heartbeat. The monitor is a small, portable device. You can wear one while you do your normal daily activities. This is usually used to diagnose what is causing palpitations/syncope (passing out).    Follow-Up:  Your physician wants you to follow-up in: [place] will receive a reminder letter in the  mail two months in advance. If you don't receive a letter, please call our office to schedule the follow-up appointment.        If you need a refill on your cardiac medications before your next appointment, please call your pharmacy.      Signed, Ena Dawley, MD  11/05/2015 7:43 AM    [address], [place], [place]  [address] Phone: [phone]; Fax: [phone]

## 2015-11-08 LAB — CARDIO IQ(R) ADVANCED LIPID PANEL
Apolipoprotein B: 70 mg/dL (ref 49–103)
Cholesterol, Total: 173 mg/dL (ref <200)
Cholesterol/HDL Ratio: 2.2 calc (ref <5.0)
HDL Cholesterol: 77 mg/dL (ref >50)
LDL Large: 6510 nmol/L (ref 5038–17886)
LDL Medium: 199 nmol/L (ref 121–397)
LDL Particle Number: 1025 nmol/L (ref 1016–2185)
LDL Peak Size: 223.6 Angstrom (ref >=218.2)
LDL Small: 165 nmol/L (ref 115–386)
LDL, Calculated: 78 mg/dL (ref <100)
Lipoprotein (a): 53 nmol/L (ref <75)
Non-HDL Cholesterol: 96 mg/dL (calc) (ref <130)
Triglycerides: 95 mg/dL (ref <150)

## 2015-11-16 ENCOUNTER — Other Ambulatory Visit: Payer: Self-pay | Admitting: *Deleted

## 2015-11-16 MED ORDER — METHOCARBAMOL 500 MG PO TABS: ORAL_TABLET | ORAL | 3 refills | Status: DC

## 2015-11-23 ENCOUNTER — Ambulatory Visit (HOSPITAL_COMMUNITY): Payer: Managed Care, Other (non HMO) | Attending: Cardiovascular Disease

## 2015-11-23 ENCOUNTER — Other Ambulatory Visit: Payer: Self-pay

## 2015-11-23 DIAGNOSIS — E782 Mixed hyperlipidemia: Secondary | ICD-10-CM

## 2015-11-23 DIAGNOSIS — D352 Benign neoplasm of pituitary gland: Secondary | ICD-10-CM

## 2015-11-23 DIAGNOSIS — R002 Palpitations: Secondary | ICD-10-CM | POA: Diagnosis not present

## 2015-11-23 DIAGNOSIS — Q796 Ehlers-Danlos syndrome: Secondary | ICD-10-CM | POA: Diagnosis not present

## 2015-11-23 DIAGNOSIS — Q7962 Hypermobile Ehlers-Danlos syndrome: Secondary | ICD-10-CM

## 2015-11-26 ENCOUNTER — Ambulatory Visit (INDEPENDENT_AMBULATORY_CARE_PROVIDER_SITE_OTHER): Payer: Managed Care, Other (non HMO)

## 2015-11-26 DIAGNOSIS — D352 Benign neoplasm of pituitary gland: Secondary | ICD-10-CM

## 2015-11-26 DIAGNOSIS — R002 Palpitations: Secondary | ICD-10-CM | POA: Diagnosis not present

## 2015-11-26 DIAGNOSIS — E782 Mixed hyperlipidemia: Secondary | ICD-10-CM | POA: Diagnosis not present

## 2015-11-26 DIAGNOSIS — Q796 Ehlers-Danlos syndrome: Secondary | ICD-10-CM | POA: Diagnosis not present

## 2015-11-26 DIAGNOSIS — Q7962 Hypermobile Ehlers-Danlos syndrome: Secondary | ICD-10-CM

## 2015-11-29 ENCOUNTER — Encounter: Payer: Self-pay | Admitting: Cardiology

## 2015-11-30 DIAGNOSIS — Z8249 Family history of ischemic heart disease and other diseases of the circulatory system: Secondary | ICD-10-CM | POA: Insufficient documentation

## 2015-12-04 ENCOUNTER — Encounter: Payer: Self-pay | Admitting: Cardiology

## 2015-12-07 ENCOUNTER — Telehealth: Payer: Self-pay | Admitting: *Deleted

## 2015-12-07 MED ORDER — METOPROLOL TARTRATE 25 MG PO TABS: 12.5000 mg | ORAL_TABLET | Freq: Two times a day (BID) | ORAL | 1 refills | Status: DC

## 2015-12-07 NOTE — Telephone Encounter (Signed)
Notified the pt that per Dr [name], her holter monitor showed that she has inappropriate sinus tachycardia, HR >100 for over 60% of the day.  Informed the pt that per Dr [name], she recommends that we start her on a low dose metoprolol 12.5 mg po bid, and she should call us if she doesn't tolerate it. Pt request that only a month supply to be sent to her confirmed pharmacy of choice, and if this is tolerated appropriately, she will then contact our office back for further refills.  Pt verbalized understanding and agrees with this plan.

## 2015-12-07 NOTE — Telephone Encounter (Signed)
-----   Message from Dorothy Spark, MD sent at 12/07/2015  4:58 PM EST ----- She has inappropriate sinus tachycardia, HR >100 for over 60% of the day. I would start her on a low dose metoprolol 12.5 mg po bid, she should call us if she doesn't tolerate it

## 2015-12-21 NOTE — Telephone Encounter (Signed)
-----   Message from Delma Officer sent at 12/17/2015 11:52 AM EST ----- Regarding: RE: assist with coverage of lipoprotein lab ordered by Ronnald Ramp,  I called [facility] regarding this matter.  According to Asencion Partridge, the claims is still with the insurance carrier but she said it showed all the line items were covered.    Suanne Marker  ----- Message ----- From: Nuala Alpha, LPN Sent: [id number]  10:37 AM To: Delma Officer Subject: assist with coverage of lipoprotein lab orde#  Suanne Marker, this pt sent Dr [name] this message through [facility] about a lab were ordered at [place] 10/18, that was denied by her carrier.  Below is the message per the pt:  I received an insurance denial for the lipoprotein blood work on 11/04/15 with the explanation that my plan doesn't cover this service. 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 I can't help but wonder if Ehlers-Danlos Syndrome was listed as the primary diagnosis that it might be covered. I expect that the echo and holter monitor will also be denied if this same primary diagnosis is used. Can you give me guidance on how to get this changed?    Dr [name] and I went in and changed the diagnosis and sent a message back to the pt stating it was changed.  Below is what diagnosis [name] changed it too.  Could you change the diagnosis to Ehlers-Danlos syndrome and FH of early CAD?    Dr [name] wanted me to ask you, if you could further assist with getting this lab covered for the pt.  Please let us know if there is anything else she needs to do.  This was all changed on the pts 10/18 OV with [place].    Thanks,   [facility]

## 2015-12-21 NOTE — Telephone Encounter (Signed)
-----   Message from Delma Officer sent at 12/17/2015 11:52 AM EST ----- Regarding: RE: assist with coverage of lipoprotein lab ordered by Ronnald Ramp,  I called [facility] regarding this matter.  According to Asencion Partridge, the claims is still with the insurance carrier but she said it showed all the line items were covered.    Suanne Marker  ----- Message ----- From: Nuala Alpha, LPN Sent: [id number]  10:37 AM To: Delma Officer Subject: assist with coverage of lipoprotein lab orde#  Suanne Marker, this pt sent Dr [name] this message through [facility] about a lab were ordered at [place] 10/18, that was denied by her carrier.  Below is the message per the pt:  I received an insurance denial for the lipoprotein blood work on 11/04/15 with the explanation that my plan doesn't cover this service. 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 I can't help but wonder if Ehlers-Danlos Syndrome was listed as the primary diagnosis that it might be covered. I expect that the echo and holter monitor will also be denied if this same primary diagnosis is used. Can you give me guidance on how to get this changed?    Dr [name] and I went in and changed the diagnosis and sent a message back to the pt stating it was changed.  Below is what diagnosis [name] changed it too.  Could you change the diagnosis to Ehlers-Danlos syndrome and FH of early CAD?    Dr [name] wanted me to ask you, if you could further assist with getting this lab covered for the pt.  Please let us know if there is anything else she needs to do.  This was all changed on the pts 10/18 OV with [place].    Thanks,   [facility]

## 2015-12-23 ENCOUNTER — Encounter: Payer: Self-pay | Admitting: Cardiology

## 2015-12-23 MED ORDER — METOPROLOL SUCCINATE ER 25 MG PO TB24: 12.5000 mg | ORAL_TABLET | Freq: Every day | ORAL | 1 refills | Status: DC

## 2016-01-05 ENCOUNTER — Other Ambulatory Visit: Payer: Self-pay | Admitting: *Deleted

## 2016-01-05 MED ORDER — DOXEPIN HCL 25 MG PO CAPS: 25.0000 mg | ORAL_CAPSULE | Freq: Every day | ORAL | 5 refills | Status: DC

## 2016-01-28 ENCOUNTER — Ambulatory Visit: Payer: Self-pay

## 2016-01-28 ENCOUNTER — Ambulatory Visit (INDEPENDENT_AMBULATORY_CARE_PROVIDER_SITE_OTHER): Payer: Managed Care, Other (non HMO) | Admitting: Sports Medicine

## 2016-01-28 ENCOUNTER — Encounter: Payer: Self-pay | Admitting: Sports Medicine

## 2016-01-28 VITALS — BP 124/80 | Ht 58.5 in | Wt 106.0 lb

## 2016-01-28 DIAGNOSIS — M898X1 Other specified disorders of bone, shoulder: Secondary | ICD-10-CM | POA: Diagnosis not present

## 2016-01-28 DIAGNOSIS — M778 Other enthesopathies, not elsewhere classified: Secondary | ICD-10-CM | POA: Insufficient documentation

## 2016-01-28 DIAGNOSIS — M7581 Other shoulder lesions, right shoulder: Secondary | ICD-10-CM

## 2016-01-28 DIAGNOSIS — M25511 Pain in right shoulder: Secondary | ICD-10-CM | POA: Insufficient documentation

## 2016-01-28 MED ORDER — PREDNISONE 20 MG PO TABS: 20.0000 mg | ORAL_TABLET | Freq: Two times a day (BID) | ORAL | 0 refills | Status: DC

## 2016-01-28 NOTE — Patient Instructions (Signed)
Follow up in 1 weeks Take Prednisone twice daily for one week Wear sling to help support right shoulder

## 2016-01-28 NOTE — Assessment & Plan Note (Signed)
On [place] it appears that she has a partial tear of her pectoralis minor from here it attaches to the coracoid process. As her muscle appears to bunch at this disruption point it appears to be abutting her neuro-vascular bundle which is likely causing the coolness of her right hand.  - will treat with prednisone x 7 days and sling for now - follow up in one week for repeat US if needed

## 2016-01-28 NOTE — Progress Notes (Signed)
  Subjective:    Patient ID: Marissa [name], female    DOB: 11/21/1965, 51 y.o.   MRN: [id number]   CC: right shoulder pain  HPI: 51 y/o F with PMH of ehlers danlos presents for right shoulder pain  Right shoulder pain - started 2 days ago upon waking - she can think of no injury - she went to her physical therapist who noted swelling of at he right shoulder and noted her right hand was colder than her left hand - she denies increased shoulder subluxation than normal - denies numbness tingling or shooting pain of her right arm - she does feel that the pain that started at the front of her shoulder is started to affect the rest of the shoulder making it feel "tight" and painful - denies weakness of her hand   Pertinent Past medical history- Ehlers danlos  Review of Systems Increased tingling into rt arm More color change in arm RT hand cold  Objective:  BP 124/80   Ht 4' 10.5" (1.486 m)   Wt 106 lb (48.1 kg)   BMI 21.78 kg/m  Vitals and nursing note reviewed  General: NAD MSK: swelling noted overlying the right coracoid process, tender to palpation, pain worse with posterior shoulder movements and extending right arm across body, 5/5 bilateral UE strength + voluntary suocus sign of RT shoulder Skin- mottling noted over right hand and right hand is cool to touch compared to left hand Strong right radial pulse  Ultrasound of Right shoulder Biceps tendon is normal Subscapularis tendon normal Pec. Major tendon normal Coracoid appears normal Just below coracodi the pec minor tendon and distal muscle appears more hypoechoic than expected and is enlarged Rotator cuff appears intact  Impression: on ultrasound it appears that the insertion of pectoralis minor to coracoid may have partially separated  Ultrasound and interpretation by Peterson Ao B. Fields, MD    Assessment & Plan:    Right shoulder pain On [place] it appears that she has a partial tear of her pectoralis minor from  here it attaches to the coracoid process. As her muscle appears to bunch at this disruption point it appears to be abutting her neuro-vascular bundle which is likely causing the coolness of her right hand.  - will treat with prednisone x 7 days and sling for now - follow up in one week for repeat US if needed     Zaide Kardell A. Lincoln Brigham MD, [facility] Resident PGY-3 Pager [phone]  I observed and examined the patient with the resident and agree with assessment and plan.  Note reviewed and modified by me. Stefanie Libel, MD

## 2016-02-02 ENCOUNTER — Ambulatory Visit (INDEPENDENT_AMBULATORY_CARE_PROVIDER_SITE_OTHER): Payer: Managed Care, Other (non HMO) | Admitting: Sports Medicine

## 2016-02-02 DIAGNOSIS — M7581 Other shoulder lesions, right shoulder: Secondary | ICD-10-CM

## 2016-02-02 DIAGNOSIS — M778 Other enthesopathies, not elsewhere classified: Secondary | ICD-10-CM

## 2016-02-02 DIAGNOSIS — M7591 Shoulder lesion, unspecified, right shoulder: Secondary | ICD-10-CM

## 2016-02-02 NOTE — Progress Notes (Signed)
Patient with RT arm and anterior shoulder pain  EDS patient who developed what appears to be pec minor separation from coracoid Documented on [place] On prednisone 20 bid, tramadol and robaxin Better Friday to Monday Worked Monday and now more tingling and pain into rt arm Feels like she has some new swelling  ROS Tingling into RT forearm and hand Wearing sling causes ome tightness in left shoulder and neck Swelling feels like it moves down into RT breast  PE Tearful F in discomfort BP 130/80  Some mild but obvious soft tissue swelling anterior to coracoid on RT Pain with RT shoulder motion Pain with ER Arm flexion and extension are pain free TTP No bruising noted

## 2016-02-02 NOTE — Assessment & Plan Note (Signed)
She can stop sling and keep hand in pocket With tightness and tingling add gabapentin 300 q 6 hours Cont tramadol and robaxin Finish Prednisone  Start some easy walkd May want to cut work to 3 or 4 hours per day until this is less painful  Reck 7 days

## 2016-02-09 ENCOUNTER — Ambulatory Visit (INDEPENDENT_AMBULATORY_CARE_PROVIDER_SITE_OTHER): Payer: Managed Care, Other (non HMO) | Admitting: Sports Medicine

## 2016-02-09 ENCOUNTER — Encounter: Payer: Self-pay | Admitting: Sports Medicine

## 2016-02-09 DIAGNOSIS — M7581 Other shoulder lesions, right shoulder: Secondary | ICD-10-CM

## 2016-02-09 DIAGNOSIS — M778 Other enthesopathies, not elsewhere classified: Secondary | ICD-10-CM

## 2016-02-09 DIAGNOSIS — M25511 Pain in right shoulder: Secondary | ICD-10-CM | POA: Diagnosis not present

## 2016-02-09 NOTE — Assessment & Plan Note (Addendum)
Still seems likely cause Consider MRI if not improved  Rest from standing and OOW x 2 weeks Give this a chance to heal  Trial of shoulder comp sleeve  Reck 2 wks

## 2016-02-09 NOTE — Assessment & Plan Note (Signed)
Not much change  Keep using ibuprofen and tylenol   Tramadol as needed

## 2016-02-09 NOTE — Progress Notes (Signed)
CC: RT arm and shoulder pain  Still severe with minimal change Gets nauseated with prolonged standing No relief with prednisone Gabapentin maybe some help but RT arm has numbness upper post humerus  Sling helps arm but causes neck pain  Standing too long or walking pain steadily increases  Sitting straight pain comes after 15 mins Sitting leaning back in recliner relieves pain  ROS No cough or sneeze pain No radicular sxs ijnto arm x for sensory change noted above  PE Pleasant F/ looks anxious BP 125/81   Pulse (!) 101   Ht 4\' 10"  (1.473 m)   Wt 106 lb (48.1 kg)   BMI 22.15 kg/m   Good strength on hand grip Mass and swelling have resolved in ant shoulder Still TTP directly over coracoid Hand grip OK Testing shows norma strength and function in hand Sensory change limited to upper humerus post

## 2016-02-11 ENCOUNTER — Ambulatory Visit: Payer: Managed Care, Other (non HMO) | Admitting: Sports Medicine

## 2016-02-12 ENCOUNTER — Other Ambulatory Visit: Payer: Self-pay | Admitting: *Deleted

## 2016-02-12 MED ORDER — DOXEPIN HCL 50 MG PO CAPS: 50.0000 mg | ORAL_CAPSULE | Freq: Every day | ORAL | 5 refills | Status: DC

## 2016-02-16 ENCOUNTER — Ambulatory Visit: Payer: Managed Care, Other (non HMO) | Admitting: Sports Medicine

## 2016-02-23 ENCOUNTER — Encounter: Payer: Self-pay | Admitting: Sports Medicine

## 2016-02-23 ENCOUNTER — Ambulatory Visit (INDEPENDENT_AMBULATORY_CARE_PROVIDER_SITE_OTHER): Payer: Managed Care, Other (non HMO) | Admitting: Sports Medicine

## 2016-02-23 DIAGNOSIS — M778 Other enthesopathies, not elsewhere classified: Secondary | ICD-10-CM

## 2016-02-23 DIAGNOSIS — M7581 Other shoulder lesions, right shoulder: Secondary | ICD-10-CM

## 2016-02-23 NOTE — Patient Instructions (Signed)
Start easy isometrics Try one position with shoulder blade x wall and see if that is tolerable Careful with any resisted internal rotation - try rotating are inward and resist  Start walking with hands in pocket Try recumbent bike with arm support  Medications - since no result with ibuprofen try using tramadol at least twice daily  Two more weeks limit work to 4 hours daily  Reck in 4 weeks

## 2016-02-23 NOTE — Progress Notes (Signed)
F/U RT shoulder and arm pain  Suspected Pec minor avulsion/ tear based on exam and [place] Clearly doing better afer restarting gabapentin Less Neurogenic pain to RT arm  Past 2 weeks working from recliner Pain dropped from 7 to8/10 level to where now is 2 to [id number] certains movements of arm - extension and IR will cause severe pain   Meds Doxepin 25 am and 50 at night Robaxin 1 or 2 Gabapentin twice daily 2 ibuprofen and tylenol - not much effect Rare tramadol  ROS Some tightness of neck on left Radicular sxs into left arm (using left arm primarily)  PE Looks in much less pain BP 104/64   Ht 4\' 10"  (1.473 m)   Wt 106 lb (48.1 kg)   BMI 22.15 kg/m   She has full motion of RT shoulder Pain with any IR with arm abducted Pain with any arm extension  Pain with ER of shoulder blade  Isometric strength testing Good preservation of strength Pain with resisted IR  Neuro check is normal

## 2016-02-23 NOTE — Assessment & Plan Note (Signed)
This is slowly improving with rest  Keep up gabapentin and other meds X ibuprofen  Try tramadol bid since no help with tylenol and ibuprof  Restrict work to 4 hours at desk and 4 hours at home  Reck 4 weeks

## 2016-03-17 ENCOUNTER — Ambulatory Visit (INDEPENDENT_AMBULATORY_CARE_PROVIDER_SITE_OTHER): Payer: Managed Care, Other (non HMO) | Admitting: Sports Medicine

## 2016-03-17 ENCOUNTER — Encounter: Payer: Self-pay | Admitting: Sports Medicine

## 2016-03-17 ENCOUNTER — Ambulatory Visit: Payer: Managed Care, Other (non HMO) | Admitting: Sports Medicine

## 2016-03-17 DIAGNOSIS — M778 Other enthesopathies, not elsewhere classified: Secondary | ICD-10-CM

## 2016-03-17 DIAGNOSIS — M7581 Other shoulder lesions, right shoulder: Secondary | ICD-10-CM

## 2016-03-17 DIAGNOSIS — M25511 Pain in right shoulder: Secondary | ICD-10-CM | POA: Diagnosis not present

## 2016-03-17 MED ORDER — TRAMADOL HCL 50 MG PO TABS: 50.0000 mg | ORAL_TABLET | Freq: Three times a day (TID) | ORAL | 2 refills | Status: DC | PRN

## 2016-03-17 NOTE — Assessment & Plan Note (Signed)
Ultrasound appearance as well as examinations show improvement  We will need to give this adequate time to heal  Any scapular motion is still somewhat painful  Recheck in about 6 weeks

## 2016-03-17 NOTE — Progress Notes (Signed)
F/u Pec minor avulsion  Working full hours now Some improvement Has to go to bed at 7 PM  Pain with lifting arm now a bit higher without sharp pain  1/2 tramadol 7am 1/2 at noon 1 at night/ 6 PM  Robaxin 1/2 at noon 1 tablet at 6 PM  Gabapentin 600 am and HS  Cont on Doxepin 25 am and 50 hs  Review of systems Denies radicular symptoms down her arm No new neck pain  Physical examination Patient appears in less pain and more comfortable BP 121/70   Pulse 87   Ht 4\' 10"  (1.473 m)   Wt 106 lb (48.1 kg)   BMI 22.15 kg/m   Range of motion of the right shoulder and arm have improved She still gets pain with any elevation above 90 She can be resisted testing with less pain Swelling over the anterior chest has resolved Less tenderness on palpation of the coracoid  Ultrasound of right coracoid There is resolution of the abnormal swelling that was noted around the attachment of the pectoralis minor There is one area with some slight calcifications and mild hypoechoic change Pectoralis major looks intact Subscapularis looks intact  Interpretation Improved appearance of pectoralis minor tendon after partial avulsion  Ultrasound and interpretation by Wolfgang Phoenix. Oneida Alar, MD

## 2016-03-17 NOTE — Assessment & Plan Note (Signed)
Pain level controlled on her current medication regimen  She does have quite a bit of pain does finishing work  No change in present medications  Gradually restart some physical activity

## 2016-04-07 ENCOUNTER — Other Ambulatory Visit: Payer: Self-pay | Admitting: Cardiology

## 2016-04-14 ENCOUNTER — Other Ambulatory Visit: Payer: Self-pay | Admitting: *Deleted

## 2016-04-14 MED ORDER — METHOCARBAMOL 500 MG PO TABS: ORAL_TABLET | ORAL | 3 refills | Status: DC

## 2016-04-18 ENCOUNTER — Encounter: Payer: Self-pay | Admitting: Sports Medicine

## 2016-04-18 ENCOUNTER — Ambulatory Visit (INDEPENDENT_AMBULATORY_CARE_PROVIDER_SITE_OTHER): Payer: Managed Care, Other (non HMO) | Admitting: Sports Medicine

## 2016-04-18 DIAGNOSIS — M7581 Other shoulder lesions, right shoulder: Secondary | ICD-10-CM

## 2016-04-18 DIAGNOSIS — M21629 Bunionette of unspecified foot: Secondary | ICD-10-CM | POA: Diagnosis not present

## 2016-04-18 DIAGNOSIS — M778 Other enthesopathies, not elsewhere classified: Secondary | ICD-10-CM

## 2016-04-18 NOTE — Assessment & Plan Note (Signed)
Suggested using top antibiotic to help moisturize and prevent infection  Donut pads

## 2016-04-18 NOTE — Progress Notes (Signed)
F/U of RT pec. Minor tear  Patient now able to use RT arm lightly 11 weeks post injury and less pain Sleeps better and does not need to support arm while sitting  New pain over RT trapezius Radiates to chest Has a tender point  Meds - uses 1/2 tramadol bid and 1 hs/ Gabapentin bid/ Robaxin bid  ROS  pain over bunionette on left Skin ulcer has resolved over this Tingling RT 5th finger but no true radicular sxs  PE Pleasant F ion NAD BP 111/62   Ht 4\' 10"  (1.473 m)   Wt 106 lb (48.1 kg)   BMI 22.15 kg/m   Holds RT arm with less guarding Full ROM of RT shoulder + hypermobility No winging of RT scapula now Spasm of RT trap TTP over suprascapular notch w +tinels sending pain to clavicle Less TTP over coracoid  Bunionette over left foot is swollen but not hot

## 2016-04-18 NOTE — Assessment & Plan Note (Signed)
This has improved with time and isometric exercise  Gradually add some strength work with PT/pilates  Now with some soft tissue neurapraxia Try salon pas patch over Supscap notch  Reck 2 mos

## 2016-04-28 ENCOUNTER — Ambulatory Visit: Payer: Managed Care, Other (non HMO) | Admitting: Sports Medicine

## 2016-05-31 ENCOUNTER — Encounter: Payer: Self-pay | Admitting: Sports Medicine

## 2016-05-31 ENCOUNTER — Ambulatory Visit
Admission: RE | Admit: 2016-05-31 | Discharge: 2016-05-31 | Disposition: A | Discharge status: Home / Self Care | Payer: Managed Care, Other (non HMO) | Source: Ambulatory Visit | Attending: Sports Medicine | Admitting: Sports Medicine

## 2016-05-31 ENCOUNTER — Ambulatory Visit (INDEPENDENT_AMBULATORY_CARE_PROVIDER_SITE_OTHER): Payer: Managed Care, Other (non HMO) | Admitting: Sports Medicine

## 2016-05-31 VITALS — BP 117/61 | Ht 58.5 in | Wt 106.0 lb

## 2016-05-31 DIAGNOSIS — M25532 Pain in left wrist: Secondary | ICD-10-CM | POA: Diagnosis not present

## 2016-06-01 ENCOUNTER — Telehealth: Payer: Self-pay | Admitting: Sports Medicine

## 2016-06-01 NOTE — Progress Notes (Signed)
   Subjective:    Patient ID: Marissa [name], female    DOB: 1965-06-11, 52 y.o.   MRN: [id number]  HPI Ms. Metts is a 51 year old female with a past medical history significant for Ehlers-Danlos Syndrome presenting with complaints of left wrist pain. She reports that 5 days ago she noticed acute pain in her left wrist and looked down and saw a bump in her wrist. She denies any trauma or falls. 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 Reports the pain is sharp and burning in nature. Constant but worse with any movement of her thumb. No pain with movement of her 2nd through 5th digits.    Review of Systems  Musculoskeletal: Negative for joint swelling.  Skin: Negative for color change and rash.  Neurological: Negative for weakness and numbness.       Objective:   Physical Exam  Constitutional: She is oriented to person, place, and time. She appears well-developed and well-nourished.  Cardiovascular: Intact distal pulses.   Musculoskeletal:       Arms: Neurological: She is alert and oriented to person, place, and time. She has normal strength. No sensory deficit.  Skin: Skin is warm and dry.  Vitals reviewed.      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 Point of care ultrasound revealed the scaphoid shifted anterior to the lunate bone.  Attempted repositioning in the office with some success. Placed her in a thumb spika splint.  XR after the visit was negative however, we had some success with repositioning. Also possible that it is subluxing and reducing spontaneously.   -Thumb spika splint  -Ice  -Return for follow up in a month  I observed and examined the patient with the resident and agree with assessment and plan.  Note reviewed and modified by me. Stefanie Libel, MD

## 2016-06-01 NOTE — Telephone Encounter (Signed)
Marissa  [name] called to see if the results of her X-Rays from yesterday were in. Please call and advise.

## 2016-06-01 NOTE — Telephone Encounter (Signed)
Called Marissa [name] back to let her know the results are being put on Dr Oneida Alar desk for review and he will call her back in the morning with results.

## 2016-06-02 DIAGNOSIS — M25539 Pain in unspecified wrist: Secondary | ICD-10-CM | POA: Insufficient documentation

## 2016-06-02 NOTE — Assessment & Plan Note (Signed)
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 Shadara Lopez, MD  XRay - no fracture or malposition even on fist view.  There is what appears a benign cyst in scaphoid body.  We will cont splint and icing for next 2 weeks and see if this resolves.

## 2016-06-21 ENCOUNTER — Encounter: Payer: Self-pay | Admitting: Sports Medicine

## 2016-06-21 ENCOUNTER — Ambulatory Visit (INDEPENDENT_AMBULATORY_CARE_PROVIDER_SITE_OTHER): Payer: Managed Care, Other (non HMO) | Admitting: Sports Medicine

## 2016-06-21 DIAGNOSIS — G8929 Other chronic pain: Secondary | ICD-10-CM

## 2016-06-21 DIAGNOSIS — M25511 Pain in right shoulder: Secondary | ICD-10-CM | POA: Diagnosis not present

## 2016-06-21 DIAGNOSIS — M25532 Pain in left wrist: Secondary | ICD-10-CM | POA: Diagnosis not present

## 2016-06-21 NOTE — Assessment & Plan Note (Signed)
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

## 2016-06-21 NOTE — Assessment & Plan Note (Signed)
I will have her continue to work with Pilates/PT  Shoulder function is good  However, may need to change treatment for pain if persistent  Reck 2 mos

## 2016-06-21 NOTE — Progress Notes (Signed)
F/u acute left wrist pain  Workup has shown that she has a bone cyst in the scaphoid pole This is directly over the area that is tender No sign of S-L subluxation on XR Splint may have helped some Still painful but not as bad  RT shoulder pain  Now 6 mos post a partial avulsion of the pec minor tendon to coracoid Still cause pain with certain motions Reaching behind back Reaching across chest No weakness in RC movements  ROS Pain level is stable on current meds No radicular sxs into either arm SI Joint stable  PE Pleasant F in NAD BP 120/70   Ht 4\' 10"  (1.473 m)   Wt 106 lb (48.1 kg)   BMI 22.15 kg/m   Painful prominent bump over volar side of scaphoid Does not feel unstable No swelling Normal wrist motion  RT shoulder Good strength with RC testing Pain with back scratch Pain with crossover Pain localizes to coracoid

## 2016-06-27 ENCOUNTER — Encounter: Payer: Self-pay | Admitting: Sports Medicine

## 2016-06-29 ENCOUNTER — Encounter: Payer: Self-pay | Admitting: *Deleted

## 2016-06-29 NOTE — Patient Instructions (Signed)
Dr Amedeo Plenty 07/18/16 at [facility] [phone]

## 2016-07-12 ENCOUNTER — Other Ambulatory Visit: Payer: Self-pay | Admitting: Family Medicine

## 2016-07-12 DIAGNOSIS — Z1231 Encounter for screening mammogram for malignant neoplasm of breast: Secondary | ICD-10-CM

## 2016-07-27 ENCOUNTER — Other Ambulatory Visit: Payer: Self-pay | Admitting: *Deleted

## 2016-07-27 MED ORDER — DOXEPIN HCL 50 MG PO CAPS: 50.0000 mg | ORAL_CAPSULE | Freq: Every day | ORAL | 5 refills | Status: DC

## 2016-08-07 ENCOUNTER — Encounter: Payer: Self-pay | Admitting: Sports Medicine

## 2016-08-11 ENCOUNTER — Ambulatory Visit (INDEPENDENT_AMBULATORY_CARE_PROVIDER_SITE_OTHER): Payer: Managed Care, Other (non HMO) | Admitting: Sports Medicine

## 2016-08-11 ENCOUNTER — Ambulatory Visit: Payer: Self-pay

## 2016-08-11 VITALS — BP 122/72 | Ht 58.5 in | Wt 106.0 lb

## 2016-08-11 DIAGNOSIS — M25511 Pain in right shoulder: Secondary | ICD-10-CM | POA: Diagnosis not present

## 2016-08-11 DIAGNOSIS — M5010 Cervical disc disorder with radiculopathy, unspecified cervical region: Secondary | ICD-10-CM | POA: Diagnosis not present

## 2016-08-11 DIAGNOSIS — G8929 Other chronic pain: Secondary | ICD-10-CM | POA: Diagnosis not present

## 2016-08-11 MED ORDER — GABAPENTIN 600 MG PO TABS: 600.0000 mg | ORAL_TABLET | Freq: Two times a day (BID) | ORAL | 1 refills | Status: DC

## 2016-08-11 MED ORDER — PREDNISONE 20 MG PO TABS: 20.0000 mg | ORAL_TABLET | Freq: Two times a day (BID) | ORAL | 0 refills | Status: DC

## 2016-08-11 NOTE — Assessment & Plan Note (Signed)
I suspect her current shoulder sxs relate to the radiculopathy [place] is unremarkable for shoulder problems Sharp pain is positional  Trial of increase gabapentin 300 tid  Prednisone 20 bid x 7 to 10 days  Reck in 2 weeks

## 2016-08-11 NOTE — Progress Notes (Signed)
CC: Severe RT shoulder pain/ different sxs  Pain much improved We used short course steroid  See office visit note

## 2016-08-16 ENCOUNTER — Encounter: Payer: Self-pay | Admitting: Sports Medicine

## 2016-08-25 ENCOUNTER — Ambulatory Visit (INDEPENDENT_AMBULATORY_CARE_PROVIDER_SITE_OTHER): Payer: Managed Care, Other (non HMO) | Admitting: Sports Medicine

## 2016-08-25 ENCOUNTER — Encounter: Payer: Self-pay | Admitting: Sports Medicine

## 2016-08-25 DIAGNOSIS — M5010 Cervical disc disorder with radiculopathy, unspecified cervical region: Secondary | ICD-10-CM

## 2016-08-25 NOTE — Progress Notes (Signed)
CC: Rt shoulder arm radicular pain  This is much (80%) improved post steroid course Has been able to increase gabapentin to 300 bid Now hurts only with certain movements - elevation or ext. Rotation In short ROM has little pain  Pain now shoots down arm along anterior side Hurst for 1 to 2 mins and then resolves Not persisting like before  ROS No recent subluxations of other joints Less night pain Cervical collar helps neck pain when tired  PE Thin, pleasant W F in NAD BP 108/70   Ht 4' 10.5" (1.486 m)   Wt 106 lb (48.1 kg)   BMI 21.78 kg/m   Has Full ROM of RT shoulder Some pain on back scratch Pain on ER past midline Strength SST with elevation, ER and IR are all normal Speeds and yergason's normal  No direct TTP over shoudler or coracoid

## 2016-08-25 NOTE — Assessment & Plan Note (Signed)
Will always have element of radicular sxs as her neck fusion did not stabilize  Work on posture  Work on gentle exercises for arm and shoulder  Keep gabapentin TX and increase to TID if tolerated  Reck 2 mos

## 2016-08-25 NOTE — Patient Instructions (Signed)
Radicular pain from neck  Work to stabilize posture Neck position and shoulders back  Your rotator cuff is OK until you get to external rotation or too uch forward shift of anterior shouler --this probably means labral damage that also irritates the nerve -work to avoid these positions  Work with Butch Penny -short arc IR and ER strength for rotator cuff -Limited forward flexion to 90 deg or less -abduction to 45 deg  I think it would be good to restart activity with these limtis  Also let's get rthe gabapentin to 300 tid - side effects should drop in 4 to 5 days

## 2016-08-26 ENCOUNTER — Ambulatory Visit
Admission: RE | Admit: 2016-08-26 | Discharge: 2016-08-26 | Disposition: A | Discharge status: Home / Self Care | Payer: Managed Care, Other (non HMO) | Source: Ambulatory Visit | Attending: Family Medicine | Admitting: Family Medicine

## 2016-08-26 DIAGNOSIS — Z1231 Encounter for screening mammogram for malignant neoplasm of breast: Secondary | ICD-10-CM

## 2016-09-29 ENCOUNTER — Ambulatory Visit (INDEPENDENT_AMBULATORY_CARE_PROVIDER_SITE_OTHER): Payer: Managed Care, Other (non HMO) | Admitting: Sports Medicine

## 2016-09-29 DIAGNOSIS — M5010 Cervical disc disorder with radiculopathy, unspecified cervical region: Secondary | ICD-10-CM

## 2016-09-29 NOTE — Assessment & Plan Note (Signed)
Patient showing improvement working with Leatrice Jewels focusing on gentle isometric cervical stabilization and some core strengthening 2 times a week. Recommended patient begin intermittent cryotherapy around 10 minutes every 2-3 hours as tolerated. Also begin vitamin B6 100 mg daily, tart cherry juice 6-8 ounces daily and nitric oxide rich diet. She will follow-up in 2 months if symptoms persist otherwise she can follow-up as needed.

## 2016-09-29 NOTE — Progress Notes (Signed)
     [facility] [address] [place], [place] [address] Phone: [phone] Fax: [phone]   Patient Name: Marissa [name] Date of Birth: [birth year] Medical Record Number: [id number] Gender: female Date of Encounter: 09/29/2016  History of Present Illness:  Marissa [name] is a 51 y.o. very pleasant female patient who presents with the following: Follow up for right shoulder pain with cervical disc disorder and radiculopathy of the cervical region. She has been seeing Leatrice Jewels, physical therapist 2 times a week and has been working on gentle isometric cervical stabilization and core strengthening.  She does report some improvement. Patient reports that pain shoots down her right arm on the anterior side with shoulder abduction and external rotation and that her right trapezius feels sore. She denies any weakness and has not had any recent subluxations of any other joints.  Does report some increased pain at night when sleeping on her right side.   Past Medical, Surgical, Social, and Family History Reviewed. Medications and Allergies reviewed and all updated if necessary.  Review of Systems: Some dizziness with gabapentin No cough or sneeze pain See history of present illness  Physical Examination: Vitals:   09/29/16 0831  BP: 110/74   Vitals:   09/29/16 0831  Weight: 106 lb (48.1 kg)  Height: 4' 10.5" (1.486 m)   Body mass index is 21.78 kg/m.  General: well appearing 51 year old female in NAD Cardiac: well perfused Resp: NWOB MSK:   Right shoulder:  No gross deformities, ecchymosis or edema.  Full flexion limited to 160 in right shoulder and abduction right shoulder elicited pain at about 85, as well as pain at 40 external rotation of the right shoulder. Additionally has muscle spasm on the right trapezius compared to the left.  Neuro: grossly normal, strength 5 out of 5 in upper extremities bilaterally  Assessment and Plan: Cervical  disc disorder with radiculopathy of cervical region Patient showing improvement working with Leatrice Jewels focusing on gentle isometric cervical stabilization and some core strengthening 2 times a week. Recommended patient begin intermittent cryotherapy around 10 minutes every 2-3 hours as tolerated. Also begin vitamin B6 100 mg daily, tart cherry juice 6-8 ounces daily and nitric oxide rich diet. She will follow-up in 2 months if symptoms persist otherwise she can follow-up as needed.    Filimon Miranda L. Rosalyn Gess, [place] Resident PGY-2  I observed and examined the patient with the resident and agree with assessment and plan.  Note reviewed and modified by me. Stefanie Libel, MD 09/29/2016 12:16 PM

## 2016-09-29 NOTE — Patient Instructions (Signed)
Marissa [name], you were seen today for follow up of shoulder pain.  We are pleased that your cervical stability appears to have improved.    We are recommending that you try to ice your trapezius for 10 minutes every 2-3 hours as tolerated. In addition, you can try tart cherry juice (about 6-8 oz daily) vitamin b6 100mg  daily and some nitric oxide rich foods like arugula, kale or beets.  Please continue working with Butch Penny, as it seems like you are making some great progress with her.   Please follow up in 2 months.   Daniel L. Rosalyn Gess, [place] Resident PGY-2 09/29/2016 9:20 AM

## 2016-11-02 DIAGNOSIS — Z01419 Encounter for gynecological examination (general) (routine) without abnormal findings: Secondary | ICD-10-CM | POA: Diagnosis not present

## 2016-11-02 DIAGNOSIS — Z6823 Body mass index (BMI) 23.0-23.9, adult: Secondary | ICD-10-CM | POA: Diagnosis not present

## 2016-11-22 ENCOUNTER — Other Ambulatory Visit (HOSPITAL_COMMUNITY): Payer: Self-pay | Admitting: Family Medicine

## 2016-11-22 DIAGNOSIS — D352 Benign neoplasm of pituitary gland: Secondary | ICD-10-CM

## 2016-11-22 DIAGNOSIS — R000 Tachycardia, unspecified: Secondary | ICD-10-CM | POA: Diagnosis not present

## 2016-11-22 DIAGNOSIS — Q796 Ehlers-Danlos syndrome: Secondary | ICD-10-CM | POA: Diagnosis not present

## 2016-11-22 DIAGNOSIS — M5090 Cervical disc disorder, unspecified, unspecified cervical region: Secondary | ICD-10-CM | POA: Diagnosis not present

## 2016-11-22 DIAGNOSIS — Z0000 Encounter for general adult medical examination without abnormal findings: Secondary | ICD-10-CM | POA: Diagnosis not present

## 2016-11-24 ENCOUNTER — Ambulatory Visit (INDEPENDENT_AMBULATORY_CARE_PROVIDER_SITE_OTHER): Payer: 59 | Admitting: Sports Medicine

## 2016-11-24 ENCOUNTER — Encounter: Payer: Self-pay | Admitting: Sports Medicine

## 2016-11-24 DIAGNOSIS — M533 Sacrococcygeal disorders, not elsewhere classified: Secondary | ICD-10-CM | POA: Diagnosis not present

## 2016-11-24 DIAGNOSIS — Q7962 Hypermobile Ehlers-Danlos syndrome: Secondary | ICD-10-CM

## 2016-11-24 DIAGNOSIS — Q796 Ehlers-Danlos syndrome: Secondary | ICD-10-CM

## 2016-11-24 DIAGNOSIS — G8929 Other chronic pain: Secondary | ICD-10-CM

## 2016-11-24 NOTE — Assessment & Plan Note (Signed)
This is improved after physical therapy She will continue doing Pilates

## 2016-11-24 NOTE — Progress Notes (Signed)
Chief complaint chronic muscle pain related to [place]  Since patient has seen me she has undergone dry needling to her right shoulder and upper extremity [place] provided this She had a total of about 5 sessions and has had a dramatically positive response She is able to move the shoulder fully without pain She was sleeping about 3 hours through the night before this would wake her up and now is able to sleep up to 7 hours She restarted some of the very light weight exercises and those are not painful  She also had some treatment on her right SI joint This is feeling better and she is not getting pain with walking  Review of systems Wears cloth shoes which don't place pressure on her bunionette's No cardiac symptoms No radicular symptoms in upper or lower extremities  Physical examination Pleasant female in no acute distress BP 124/82   Ht 4' 10.5" (1.486 m)   Wt 106 lb (48.1 kg)   BMI 21.78 kg/m   Right shoulder Full range of motion and she has mild hypermobility today No spasm over the trapezius or other muscles in the shoulder girdle Rotator cuff strength testing is normal  Neck shows an increased range of motion No pain with full extension or flexion No pain with lateral bending or rotation  SI joints move normally today and are not painful Pelvic position is more neutral

## 2016-11-24 NOTE — Assessment & Plan Note (Signed)
Good control of her pain with doxepin 20 5 in the morning and 50 at night She has cut gabapentin to 150 at night If this does not seem to increase her pain she may drop the gabapentin within a couple weeks  Excellent response of chronic muscle pain to dry needling We will try that in the future if she gets recurrent problems

## 2016-11-26 ENCOUNTER — Ambulatory Visit (HOSPITAL_COMMUNITY)
Admission: RE | Admit: 2016-11-26 | Discharge: 2016-11-26 | Disposition: A | Discharge status: Home / Self Care | Payer: 59 | Source: Ambulatory Visit | Attending: Family Medicine | Admitting: Family Medicine

## 2016-11-26 DIAGNOSIS — D352 Benign neoplasm of pituitary gland: Secondary | ICD-10-CM | POA: Diagnosis not present

## 2016-11-26 DIAGNOSIS — G9389 Other specified disorders of brain: Secondary | ICD-10-CM | POA: Insufficient documentation

## 2016-11-26 MED ORDER — GADOBENATE DIMEGLUMINE 529 MG/ML IV SOLN
10.0000 mL | Freq: Once | INTRAVENOUS | Status: AC | PRN
  Administered 2016-11-26: 10 mL via INTRAVENOUS

## 2016-11-28 DIAGNOSIS — D352 Benign neoplasm of pituitary gland: Secondary | ICD-10-CM | POA: Diagnosis not present

## 2016-12-01 ENCOUNTER — Ambulatory Visit: Payer: Managed Care, Other (non HMO) | Admitting: Sports Medicine

## 2016-12-11 ENCOUNTER — Other Ambulatory Visit: Payer: Self-pay | Admitting: Sports Medicine

## 2017-01-05 ENCOUNTER — Other Ambulatory Visit: Payer: Self-pay | Admitting: *Deleted

## 2017-01-05 MED ORDER — PREDNISONE 20 MG PO TABS: 20.0000 mg | ORAL_TABLET | Freq: Two times a day (BID) | ORAL | 0 refills | Status: DC

## 2017-01-16 ENCOUNTER — Other Ambulatory Visit: Payer: Self-pay | Admitting: *Deleted

## 2017-01-16 MED ORDER — DOXEPIN HCL 50 MG PO CAPS: 50.0000 mg | ORAL_CAPSULE | Freq: Every day | ORAL | 5 refills | Status: DC

## 2017-01-19 ENCOUNTER — Ambulatory Visit (INDEPENDENT_AMBULATORY_CARE_PROVIDER_SITE_OTHER): Payer: 59 | Admitting: Sports Medicine

## 2017-01-19 ENCOUNTER — Encounter: Payer: Self-pay | Admitting: Sports Medicine

## 2017-01-19 DIAGNOSIS — M5010 Cervical disc disorder with radiculopathy, unspecified cervical region: Secondary | ICD-10-CM

## 2017-01-19 MED ORDER — TRAMADOL HCL 50 MG PO TABS: 50.0000 mg | ORAL_TABLET | Freq: Two times a day (BID) | ORAL | 2 refills | Status: DC

## 2017-01-19 NOTE — Progress Notes (Signed)
CC: Left shoulder and arm pain  Patient with EDS Hx of cervical disc ruptures Failed cervical fusion Has had periodic radiculopathy  Just had 2 good months with minimal MSK pain Past 2 weeks some progressive tightness and some numbness in left arm Some pain down along medial scapula Feels better if lies down or puts on cervical collar   No specific injury triggered this Computer work, standing or sitting too long bring out sxs  ROS No pain or weakness in hand or forearm No cough or sneeze pain  PE Pleasant F NAD BP 124/80   Ht 4' 10.5" (1.486 m)   Wt 106 lb (48.1 kg)   BMI 21.78 kg/m    Full ROM of left shoulder Tightness in left Trap Good strength on testing C5 to T1 No winging of scapula or dysfunction  Neck motion shows hypermobility Pain not worse with neck motion  Sensation feels different on left upper arm

## 2017-01-19 NOTE — Patient Instructions (Signed)
Let's try blocking nerve irritation  Use collar every 4 hours or so for 30 mins  Tramadol at about 10:30 and then later at ~ 4 pm if needed  Use Gabapentin 600 at night  Use heat/ hot shower  Robaxin as needed

## 2017-01-19 NOTE — Assessment & Plan Note (Signed)
Suspect this is related to her chronic cervical disc issues May have some anterior subluxation  Start some tramadol during day 600 gabapentin at HS  Periodic lying flat and Cerv. Collar use Isometric extension exercise  Reck 1 month

## 2017-02-16 ENCOUNTER — Ambulatory Visit (INDEPENDENT_AMBULATORY_CARE_PROVIDER_SITE_OTHER): Payer: 59 | Admitting: Cardiology

## 2017-02-16 ENCOUNTER — Encounter: Payer: Self-pay | Admitting: Cardiology

## 2017-02-16 VITALS — BP 128/70 | HR 126 | Resp 16 | Ht 58.5 in | Wt 114.8 lb

## 2017-02-16 DIAGNOSIS — Q796 Ehlers-Danlos syndrome: Secondary | ICD-10-CM | POA: Diagnosis not present

## 2017-02-16 DIAGNOSIS — Q7962 Hypermobile Ehlers-Danlos syndrome: Secondary | ICD-10-CM

## 2017-02-16 DIAGNOSIS — R002 Palpitations: Secondary | ICD-10-CM

## 2017-02-16 DIAGNOSIS — R000 Tachycardia, unspecified: Secondary | ICD-10-CM | POA: Diagnosis not present

## 2017-02-16 MED ORDER — METOPROLOL SUCCINATE ER 25 MG PO TB24: 12.5000 mg | ORAL_TABLET | Freq: Every day | ORAL | 6 refills | Status: DC

## 2017-02-16 NOTE — Patient Instructions (Signed)
Medication Instructions:   CONTINUE TAKING TOPROL XL (METOPROLOL SUCCINATE) 12.5 MG ONCE DAILY    Follow-Up:  Your physician wants you to follow-up in: [place] will receive a reminder letter in the mail two months in advance. If you don't receive a letter, please call our office to schedule the follow-up appointment.  .     If you need a refill on your cardiac medications before your next appointment, please call your pharmacy.

## 2017-02-16 NOTE — Progress Notes (Signed)
Cardiology Office Note    Date:  02/16/2017   ID:  Marissa [name], DOB [birth year], MRN [id number]  PCP:  Kelton Pillar, MD  Cardiologist:  Ena Dawley, MD   Chief complain: Establish cardiology care with dg of Ehlers-Danlos syndrome  History of Present Illness:  Marissa [name] is a 52 y.o. 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  The patient has a diagnosis of Ehlers-Danlos syndrome, she has tremendous orthopedic problems followed by Dr Stefanie Libel.  She has been experiencing palpitations and states that her heart rate is always high despite good hydration. She is not active in sports as she is limited with orthopedic issues, but walks most days without SOB or chest pain. No syncope.  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 She had an echocardiogram approximately 10 years ago and was told it was normal.  She has never smoked, doesn't have any children.   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 She has been going through physical therapy and using a lot of pain medication control. As a result she was experiencing dizziness and couldn't tolerate Toprol-XL that she stopped taking in January 2018. She has been experiencing a lot of palpitations and has been tachycardic almost all the time.On one occasion while in the pilate class she experienced dyspnea on exertion.  Past Medical History:  Diagnosis Date  . Ehlers-Danlos syndrome   . History of pituitary tumor   . Ocular migraine     Past Surgical History:  Procedure Laterality Date  . KNEE SURGERY N/A 2012  . SHOULDER SURGERY N/A 2009  . UTERINE FIBROID SURGERY N/A     Current  Medications: Outpatient Medications Prior to Visit  Medication Sig Dispense Refill  . diphenhydramine-acetaminophen (TYLENOL PM) 25-500 MG TABS Take 1 tablet by mouth at bedtime as needed. Reported on 08/04/2015    . doxepin (SINEQUAN) 25 MG capsule TAKE 1 CAPSULE (25 MG TOTAL) BY MOUTH DAILY. 60 capsule 5  . doxepin (SINEQUAN) 50 MG capsule Take 1 capsule (50 mg total) by mouth at bedtime. 30 capsule 5  . methocarbamol (ROBAXIN) 500 MG tablet Take twice a day as needed. 30 tablet 3  . norgestrel-ethinyl estradiol (CRYSELLE-28) 0.3-30 MG-MCG tablet Take active pills daily/continuosly, skip placebo pills    . traMADol (ULTRAM) 50 MG tablet Take 1 tablet (50 mg total) by mouth 2 (two) times daily. 60 tablet 2  . gabapentin (NEURONTIN) 600 MG tablet Take 1 tablet (600 mg total) by mouth 2 (two) times daily. (Patient not taking: Reported on 02/16/2017) 60 tablet 1  . cephALEXin (KEFLEX) 500 MG capsule Take 500 mg by mouth 2 (two) times daily.  0  . diclofenac (CATAFLAM) 50 MG tablet Take 50 mg by mouth 2 (two) times daily. Reported on 08/04/2015  3  . metoprolol succinate (TOPROL-XL) 25 MG 24 hr tablet TAKE 1/2 TABLET BY MOUTH DAILY (Patient not taking: Reported on 08/11/2016) 30 tablet 6  . metoprolol tartrate (LOPRESSOR) 25 MG tablet     . predniSONE (DELTASONE) 20 MG tablet Take 1 tablet (20 mg total) by mouth 2 (two) times daily. 20 tablet 0   No  facility-administered medications prior to visit.      Allergies:   Macrobid [nitrofurantoin monohyd macro]   Social History   Socioeconomic History  . Marital status: Single    Spouse name: None  . Number of children: 0  . Years of education: Masters  . Highest education level: None  Social Needs  . Financial resource strain: None  . Food insecurity - worry: None  . Food insecurity - inability: None  . Transportation needs - medical: None  . Transportation needs - non-medical: None  Occupational History    Employer: [employer]    Tobacco Use  . Smoking status: Never Smoker  . Smokeless tobacco: Never Used  Substance and Sexual Activity  . Alcohol use: No  . Drug use: No  . Sexual activity: Yes    Birth control/protection: Pill  Other Topics Concern  . None  Social History Narrative  . None     Family History:  The patient's family history includes Arthritis in her father and mother; Atrial fibrillation in her mother; Diabetes in her father; Heart disease in her father; Heart failure in her father; Hypertension in her father.   ROS:   Please see the history of present illness.    ROS All other systems reviewed and are negative.   PHYSICAL EXAM:   VS:  BP 128/70   Pulse (!) 126   Resp 16   Ht 4' 10.5" (1.486 m)   Wt 114 lb 12.8 oz (52.1 kg)   SpO2 96%   BMI 23.58 kg/m    GEN: Well nourished, well developed, in no acute distress  HEENT: normal  Neck: no JVD, carotid bruits, or masses Cardiac: RRR; 1/6 systolic murmurs, rubs, or gallops,no edema  Respiratory:  clear to auscultation bilaterally, normal work of breathing GI: soft, nontender, nondistended, + BS MS: no deformity or atrophy  Skin: warm and dry, no rash Neuro:  Alert and Oriented x 3, Strength and sensation are intact Psych: euthymic mood, full affect  Wt Readings from Last 3 Encounters:  02/16/17 114 lb 12.8 oz (52.1 kg)  01/19/17 106 lb (48.1 kg)  11/24/16 106 lb (48.1 kg)      Studies/Labs Reviewed:   EKG:  EKG demonstrates NSR, low voltage ECG  Recent Labs: No results found for requested labs within last 8760 hours.   Lipid Panel    Component Value Date/Time   CHOL 173 11/04/2015 1046   TRIG 95 11/04/2015 1046   HDL 77 11/04/2015 1046   CHOLHDL 2.2 11/04/2015 1046   LDLCALC 78 11/04/2015 1046   TTE: 11/2015 Left ventricle:  The cavity size was normal. Systolic function was normal. The estimated ejection fraction was in the range of 55% to 60%. Wall motion was normal; there were no regional wall  motion abnormalities. The transmitral flow pattern was normal. The deceleration time of the early transmitral flow velocity was normal. The pulmonary vein flow pattern was normal. The tissue Doppler parameters were normal. Left ventricular diastolic function parameters were normal. ------------------------------------------------------------------- Aortic valve:   Trileaflet; normal thickness leaflets. Mobility was not restricted.  Doppler:  Transvalvular velocity was within the normal range. There was no stenosis. There was no regurgitation. ------------------------------------------------------------------- Aorta:  Aortic root: The aortic root was normal in size. ------------------------------------------------------------------- Mitral valve:   Structurally normal valve.   Mobility was not restricted.  Doppler:  Transvalvular velocity was within the normal range. There was no evidence for stenosis. There was no regurgitation. ------------------------------------------------------------------- Left atrium:  The  atrium was normal in size. ------------------------------------------------------------------- Right ventricle:  The cavity size was normal. Wall thickness was normal. Systolic function was normal.  EKG performed today 02/16/2017 shows sinus tachycardia otherwise normal EKG, heart rate is faster than previously   ASSESSMENT:    1. Palpitations   2. Ehlers-Danlos syndrome, type 3   3. Inappropriate sinus tachycardia     PLAN:  In order of problems listed above:  1. The patient carries a diagnosis of Ehlers-Danlos syndrome, a connective tissue disorder associated with valvular disease - however her echo showed normal LVEF with normal size of the aortic root, no AI, mildly thickened mitral valve with no prolapse and only trivial mil regurgitation. 2. Palpitation, sinus tachycardia, inappropriate sinus tachycardia on Holter in 2017, she is back in sinus tachycardia that is  exaggerated on exertion, e will restart Toprol-XL 12.5 mg daily to be taken at night. She previously couldn't tolerate twice daily metoprolol. 3. Hyperlipidemia - h/o and also in her father, father had MI in his 43', we will check and treat appropriately.Her lipids were all at goal in 2017.   Medication Adjustments/Labs and Tests Ordered: Current medicines are reviewed at length with the patient today.  Concerns regarding medicines are outlined above.  Medication changes, Labs and Tests ordered today are listed in the Patient Instructions below. Patient Instructions  Medication Instructions:   CONTINUE TAKING TOPROL XL (METOPROLOL SUCCINATE) 12.5 MG ONCE DAILY    Follow-Up:  Your physician wants you to follow-up in: [place] will receive a reminder letter in the mail two months in advance. If you don't receive a letter, please call our office to schedule the follow-up appointment.  .     If you need a refill on your cardiac medications before your next appointment, please call your pharmacy.      Signed, Ena Dawley, MD  02/16/2017 8:51 AM    [address], [place],   [id number] Phone: [phone]; Fax: [phone]

## 2017-02-23 ENCOUNTER — Ambulatory Visit (INDEPENDENT_AMBULATORY_CARE_PROVIDER_SITE_OTHER): Payer: 59 | Admitting: Sports Medicine

## 2017-02-23 ENCOUNTER — Encounter: Payer: Self-pay | Admitting: Sports Medicine

## 2017-02-23 DIAGNOSIS — M5010 Cervical disc disorder with radiculopathy, unspecified cervical region: Secondary | ICD-10-CM | POA: Diagnosis not present

## 2017-02-23 DIAGNOSIS — M21621 Bunionette of right foot: Secondary | ICD-10-CM | POA: Diagnosis not present

## 2017-02-23 DIAGNOSIS — M21622 Bunionette of left foot: Secondary | ICD-10-CM

## 2017-02-23 NOTE — Assessment & Plan Note (Signed)
Patient is describing symptoms consistent with cervical radiculopathy.  Likely secondary to patient's cervical instability that is directly related to her Ehlers-Danlos syndrome and prior diagnosed cervical disc herniation.  No red flag symptoms at this time. -Increase gabapentin dosing: TID, 300/300/600mg  -Continue cervical neck collar use -Continue isometric cervical muscle strengthening exercises. -Continue formal physical therapy -Discussed regular HEP with "arm swings" -Encouraged Robaxin use for muscle spasms, trapezius  Next: Consideration for future imaging to fully assess any progression within the cervical spine.  Also, if symptoms continue to worsen one may consider reassessing the possibility of surgical intervention (although outcomes can be less hopeful in those with hypermobility syndromes).

## 2017-02-23 NOTE — Assessment & Plan Note (Signed)
Bunionette's with symptomatic hammer toeing noted on exam. -Metatarsal pads provided today.

## 2017-02-23 NOTE — Progress Notes (Signed)
HPI  CC: Follow-up left shoulder and neck pain Patient is here to discuss her left arm neck and scapular pain/numbness.  At our last visit she was restarted on gabapentin and given specific home exercises to begin.  She states that she has had some slight improvement regarding the arm numbness.  Unfortunately, she has had some worsening in her scapular pain which is now getting to the point where it keeps her up at night and she feels as though it is unable for her to get comfortable.  She denies any new injury, trauma, or events which may have exacerbated this.  She occasionally has some discomfort with deep breathing.  Scapular pain feels like a "ice pick" along the inferior border of her scapula.  Numbness persists from the base of the neck down the arm to about the elbow.  She denies any weakness.  Patient endorses good compliance with physical therapy.  Feet, bilateral: At the end of our visit patient noted some discomfort at the fifth digit of both feet.  Left worse than right.  No injury, trauma, or event which caused this.  She states that she feels as though there is a significant amount of pressure being put on these areas and she has rubbing from her shoes.  She is asking if there is anything we can do about this.  Medications/Interventions Tried: Muscle relaxants, neck collar, physical therapy, home exercises, gabapentin, anti-inflammatories.  See HPI and/or previous note for associated ROS.  Objective: BP 128/86   Pulse 96   Ht 4\' 10"  (1.473 m)   Wt 106 lb (48.1 kg)   BMI 22.15 kg/m  Gen: NAD, well groomed, a/o x3, normal affect.  CV: Well-perfused. Warm.  Resp: Non-labored.  Neuro: No gross coordination deficits.  Numbness and some paresthesias reported from the left base of the neck down to the elbow.  DTRs +2 bilaterally. Gait: Nonpathologic posture, unremarkable stride without signs of limp or balance issues. Neck/Scapula, Left: Inspection yields no evidence of erythema,  ecchymosis, rash, muscle atrophy, or asymmetry.  Significant muscle tightness present along bilateral trapezius muscles.  No scapular winging on exam.  Tenderness to palpation along the inferior edge of the scapula and nearby ribs.  Muscle strength of the rotator cuff 5/5 bilaterally.  Range of motion full with bilateral shoulders.  Neck range of motion full in extension and flexion, 70 degrees / 70 degrees and lateral flexion.  75 degrees / 70 degrees in rotation right and left respectively. Foot, bilateral: Inspection yields some hammer toeing of the fourth and fifth digits.  Erythema noted along the lateral aspect of the fifth digits.  Bunionette formation also present bilaterally.  Transverse arch collapse present as she also has evidence of splaying between the first and second digits.   Assessment and plan:  Cervical disc disorder with radiculopathy of cervical region Patient is describing symptoms consistent with cervical radiculopathy.  Likely secondary to patient's cervical instability that is directly related to her Ehlers-Danlos syndrome and prior diagnosed cervical disc herniation.  No red flag symptoms at this time. -Increase gabapentin dosing: TID, 300/300/600mg  -Continue cervical neck collar use -Continue isometric cervical muscle strengthening exercises. -Continue formal physical therapy -Discussed regular HEP with "arm swings" -Encouraged Robaxin use for muscle spasms, trapezius  Next: Consideration for future imaging to fully assess any progression within the cervical spine.  Also, if symptoms continue to worsen one may consider reassessing the possibility of surgical intervention (although outcomes can be less hopeful in those with hypermobility  syndromes).  Bunionette Bunionette's with symptomatic hammer toeing noted on exam. -Metatarsal pads provided today.   Elberta Leatherwood, MD,MS [facility] 02/23/2017 6:52 PM

## 2017-03-02 ENCOUNTER — Encounter: Payer: Self-pay | Admitting: Sports Medicine

## 2017-03-02 ENCOUNTER — Ambulatory Visit (INDEPENDENT_AMBULATORY_CARE_PROVIDER_SITE_OTHER): Payer: 59 | Admitting: Sports Medicine

## 2017-03-02 ENCOUNTER — Encounter (INDEPENDENT_AMBULATORY_CARE_PROVIDER_SITE_OTHER): Payer: Self-pay

## 2017-03-02 VITALS — BP 104/72 | Ht 58.5 in | Wt 106.0 lb

## 2017-03-02 DIAGNOSIS — M5010 Cervical disc disorder with radiculopathy, unspecified cervical region: Secondary | ICD-10-CM | POA: Diagnosis not present

## 2017-03-02 MED ORDER — DOXEPIN HCL 25 MG PO CAPS: 25.0000 mg | ORAL_CAPSULE | Freq: Three times a day (TID) | ORAL | 5 refills | Status: DC

## 2017-03-02 NOTE — Progress Notes (Signed)
CC Radicular pain to left arm and scapula  EDS and hypermobility Has had neck instability and fusion failed Past 2 mos with more radicular pain We increased gabapentin but no relief We increased robaxin - no relief Does use a couple tramadol in PM and gets some releif Wakes her from sleep   Past HX Recently started on metoprolol for POTS  ROS Tingling into left elbow and forearm Sharp pain takes breath at tip of scapula  PE Gen - pleasant F looks uncomfortable TTP along medial border of left scapula Spasm in left trap and left paraspinous mm Normal movement left arm and shoulder No weakness but tingling with palpation Neck motion is increased and does not chang pain pattern  Procedure Trigger point injection post ethyl chloride spray Localized 5 trigger points about 2 cm apart along medial left scapular border Prepped with alcohol Ethyl choloride spray Injection of each with ~ 1 cc lidocaine 1% Patient tolerated procedure well

## 2017-03-02 NOTE — Assessment & Plan Note (Signed)
Difficult case Trial of increased Doxepin  Trigger point injections  Ice application to MM spasm for 5 mins q 4 hrs  Reck 1 month

## 2017-03-10 DIAGNOSIS — Q796 Ehlers-Danlos syndrome: Secondary | ICD-10-CM | POA: Diagnosis not present

## 2017-03-10 DIAGNOSIS — R21 Rash and other nonspecific skin eruption: Secondary | ICD-10-CM | POA: Diagnosis not present

## 2017-03-11 ENCOUNTER — Encounter (INDEPENDENT_AMBULATORY_CARE_PROVIDER_SITE_OTHER): Payer: Self-pay

## 2017-03-12 ENCOUNTER — Encounter (HOSPITAL_COMMUNITY): Payer: Self-pay | Admitting: Emergency Medicine

## 2017-03-12 ENCOUNTER — Emergency Department (HOSPITAL_COMMUNITY): Payer: 59

## 2017-03-12 ENCOUNTER — Emergency Department (HOSPITAL_COMMUNITY)
Admission: EM | Admit: 2017-03-12 | Discharge: 2017-03-12 | Disposition: A | Discharge status: Home / Self Care | Payer: 59 | Attending: Emergency Medicine | Admitting: Emergency Medicine

## 2017-03-12 DIAGNOSIS — Z79899 Other long term (current) drug therapy: Secondary | ICD-10-CM | POA: Diagnosis not present

## 2017-03-12 DIAGNOSIS — Q796 Ehlers-Danlos syndrome: Secondary | ICD-10-CM | POA: Diagnosis not present

## 2017-03-12 DIAGNOSIS — R21 Rash and other nonspecific skin eruption: Secondary | ICD-10-CM | POA: Diagnosis not present

## 2017-03-12 LAB — CBC WITH DIFFERENTIAL/PLATELET
Basophils Absolute: 0 10*3/uL (ref 0.0–0.1)
Basophils Relative: 0 %
Eosinophils Absolute: 0.1 10*3/uL (ref 0.0–0.7)
Eosinophils Relative: 1 %
HCT: 39.8 % (ref 36.0–46.0)
Hemoglobin: 13.4 g/dL (ref 12.0–15.0)
Lymphocytes Relative: 6 %
Lymphs Abs: 0.6 10*3/uL — ABNORMAL LOW (ref 0.7–4.0)
MCH: 31.2 pg (ref 26.0–34.0)
MCHC: 33.7 g/dL (ref 30.0–36.0)
MCV: 92.8 fL (ref 78.0–100.0)
Monocytes Absolute: 0.1 10*3/uL (ref 0.1–1.0)
Monocytes Relative: 1 %
Neutro Abs: 9.1 10*3/uL — ABNORMAL HIGH (ref 1.7–7.7)
Neutrophils Relative %: 92 %
Platelets: 353 10*3/uL (ref 150–400)
RBC: 4.29 MIL/uL (ref 3.87–5.11)
RDW: 12.8 % (ref 11.5–15.5)
WBC: 9.8 10*3/uL (ref 4.0–10.5)

## 2017-03-12 LAB — I-STAT CG4 LACTIC ACID, ED: Lactic Acid, Venous: 1.76 mmol/L (ref 0.5–1.9)

## 2017-03-12 LAB — URINALYSIS, ROUTINE W REFLEX MICROSCOPIC
Bilirubin Urine: NEGATIVE
Glucose, UA: NEGATIVE mg/dL
Ketones, ur: NEGATIVE mg/dL
Leukocytes, UA: NEGATIVE
Nitrite: NEGATIVE
Protein, ur: NEGATIVE mg/dL
Specific Gravity, Urine: 1.002 — ABNORMAL LOW (ref 1.005–1.030)
Squamous Epithelial / LPF: NONE SEEN
pH: 7 (ref 5.0–8.0)

## 2017-03-12 LAB — COMPREHENSIVE METABOLIC PANEL
ALT: 19 U/L (ref 14–54)
AST: 21 U/L (ref 15–41)
Albumin: 3.7 g/dL (ref 3.5–5.0)
Alkaline Phosphatase: 46 U/L (ref 38–126)
Anion gap: 15 (ref 5–15)
BUN: 7 mg/dL (ref 6–20)
CO2: 23 mmol/L (ref 22–32)
Calcium: 9.2 mg/dL (ref 8.9–10.3)
Chloride: 103 mmol/L (ref 101–111)
Creatinine, Ser: 0.77 mg/dL (ref 0.44–1.00)
GFR calc Af Amer: >60 mL/min (ref >60)
GFR calc non Af Amer: >60 mL/min (ref >60)
Glucose, Bld: 124 mg/dL — ABNORMAL HIGH (ref 65–99)
Potassium: 3.6 mmol/L (ref 3.5–5.1)
Sodium: 141 mmol/L (ref 135–145)
Total Bilirubin: 0.3 mg/dL (ref 0.3–1.2)
Total Protein: 7.6 g/dL (ref 6.5–8.1)

## 2017-03-12 LAB — C-REACTIVE PROTEIN: CRP: 8.4 mg/dL — ABNORMAL HIGH (ref <1.0)

## 2017-03-12 LAB — SEDIMENTATION RATE: Sed Rate: 42 mm/hr — ABNORMAL HIGH (ref 0–22)

## 2017-03-12 MED ORDER — METHYLPREDNISOLONE SODIUM SUCC 125 MG IJ SOLR
125.0000 mg | Freq: Once | INTRAMUSCULAR | Status: AC
  Administered 2017-03-12: 125 mg via INTRAVENOUS
  Filled 2017-03-12: qty 2

## 2017-03-12 MED ORDER — KETOROLAC TROMETHAMINE 30 MG/ML IJ SOLN
30.0000 mg | Freq: Once | INTRAMUSCULAR | Status: AC
  Administered 2017-03-12: 30 mg via INTRAVENOUS
  Filled 2017-03-12: qty 1

## 2017-03-12 MED ORDER — SODIUM CHLORIDE 0.9 % IV BOLUS (SEPSIS)
1000.0000 mL | Freq: Once | INTRAVENOUS | Status: AC
  Administered 2017-03-12: 1000 mL via INTRAVENOUS

## 2017-03-12 MED ORDER — OXYCODONE-ACETAMINOPHEN 5-325 MG PO TABS: 1.0000 | ORAL_TABLET | ORAL | 0 refills | Status: DC | PRN

## 2017-03-12 NOTE — Discharge Instructions (Signed)
You may use Percocet in addition to ibuprofen as needed for pain and discomfort.  Your initial labs today are reassuring, sed rate has increased slightly and is 42 today.  Please follow-up with the [facility] dermatology clinic tomorrow morning as planned.  If you begin feeling worse, develop fevers or chills, generalized aches and malaise, nausea vomiting or other concerning symptoms please return to the ED for reevaluation.

## 2017-03-12 NOTE — ED Notes (Signed)
ED Provider at bedside. 

## 2017-03-12 NOTE — ED Provider Notes (Signed)
[facility] DEPT Provider Note   CSN: [id number] Arrival date & time: 03/12/17  7628     History   Chief Complaint Chief Complaint  Patient presents with  . Rash    HPI Marissa [name] is a 52 y.o. female.  Marissa [name] is a 52 y.o. Female clots, ocular migraines and prior pituitary tumor, who presents to the ED for evaluation of progressively worsening rash.  Patient reports rash started Thursday morning when she noticed a few small lesions on the backs of her arms, since then it has become progressively worse, spreading down all extremities and turning into large red raised patches with some bruising noted on flexural surfaces.  Patient reports on Friday some of the lesions have blistered and opened up and she had to get in the shower in order to take her shirt off.  Patient denies any bleeding from the lesions. Has not noticed may lesions in the trunk, a few developed on face today and in mouth, as well as on the palms and soles.  Rash burns, does not itch is uncomfortable and has been keeping her from sleeping.  Pt reports she does not feel generally malaised, had mild sore throat Thursday no other symptoms.  Patient reports low-grade fevers of anything at home, T-max 99, occasional chills if any.  Patient reports she had some diffuse arthralgias on Friday which has since resolved.  After rash progressed on Friday patient initially saw her primary care doctor who thought this may be related to a drug reaction, patient denies any new medications but does report a recent increase in her dose of doxepin.  Patient denies any other new household products, soaps or detergents or new foods.  Patient was treated with steroids and antihistamines at her PCP.  She has been talking to her sister who is a Industrial/product designer and has shown pictures to a dermatologist, unsure what the rash is, but feel the patient will need a skin biopsy.  Patient denies any recent travel, she does not  work with young children.      Past Medical History:  Diagnosis Date  . Ehlers-Danlos syndrome   . History of pituitary tumor   . Ocular migraine     Patient Active Problem List   Diagnosis Date Noted  . Wrist pain, acute 06/02/2016  . Enthesopathy of right shoulder 01/28/2016  . Right shoulder pain 01/28/2016  . Family history of early CAD 11/30/2015  . Cervical disc disorder with radiculopathy of cervical region 05/13/2014  . Chronic right SI joint pain 08/28/2013  . Hip pain, right 06/25/2013  . Elbow pain, right 02/13/2013  . Bunionette 01/07/2013  . Ehlers-Danlos syndrome, type 3 11/08/2012  . Unspecified visual disturbance 04/19/2012  . 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 KNEE SURGERY N/A 2012  . SHOULDER SURGERY N/A 2009  . UTERINE FIBROID SURGERY N/A     OB History    No data available       Home Medications    Prior to Admission medications   Medication Sig Start Date End Date Taking? Authorizing Provider  diphenhydramine-acetaminophen (TYLENOL PM) 25-500 MG TABS Take 1 tablet by mouth at bedtime as needed. Reported on 08/04/2015   Yes [provider]  doxepin (SINEQUAN) 25 MG capsule Take 1 capsule (25 mg total) by mouth 3 (three) times daily. Patient taking differently: Take 25-50 mg by mouth 2 (two) times daily. Take  25 mg by mouth twice a day and 50 mg at bedtime 03/02/17  Yes MacDougall, Laban Emperor, MD  doxepin (SINEQUAN) 50 MG capsule Take 1 capsule (50 mg total) by mouth at bedtime. Patient taking differently: Take 50 mg by mouth at bedtime. Take 25 mg by mouth twice a day and 50 mg at bedtime 01/16/17  Yes Stefanie Libel, MD  gabapentin (NEURONTIN) 600 MG tablet Take 1 tablet (600 mg total) by mouth 2 (two) times daily. Patient taking differently: Take 600 mg by mouth daily.  08/11/16  Yes Stefanie Libel, MD  hydrOXYzine (ATARAX/VISTARIL) 25 MG tablet Take 25 mg by  mouth 3 (three) times daily as needed. 03/10/17  Yes [provider]  methocarbamol (ROBAXIN) 500 MG tablet Take twice a day as needed. 04/14/16  Yes Stefanie Libel, MD  metoprolol succinate (TOPROL-XL) 25 MG 24 hr tablet Take 0.5 tablets (12.5 mg total) by mouth daily. 02/16/17  Yes Dorothy Spark, MD  norgestrel-ethinyl estradiol (CRYSELLE-28) 0.3-30 MG-MCG tablet Take active pills daily/continuosly, skip placebo pills 11/27/13  Yes [provider]  predniSONE (DELTASONE) 10 MG tablet Take by mouth See admin instructions. Taper Dose: 6-6,4-4,2-2, 03/10/17  Yes [provider]  traMADol (ULTRAM) 50 MG tablet Take 1 tablet (50 mg total) by mouth 2 (two) times daily. 01/19/17  Yes Stefanie Libel, MD    Family History Family History  Problem Relation Age of Onset  . Arthritis Mother   . Atrial fibrillation Mother   . Arthritis Father   . Diabetes Father   . Heart disease Father   . Hypertension Father   . Heart failure Father   . Breast cancer Neg Hx     Social History Social History   Tobacco Use  . Smoking status: Never Smoker  . Smokeless tobacco: Never Used  Substance Use Topics  . Alcohol use: No  . Drug use: No     Allergies   Macrobid [nitrofurantoin monohyd macro]   Review of Systems Review of Systems  Constitutional: Positive for chills. Negative for fatigue and fever.  HENT: Negative for congestion, rhinorrhea and sore throat.   Eyes: Negative for pain and visual disturbance.  Respiratory: Negative for cough, chest tightness and shortness of breath.   Cardiovascular: Negative for chest pain.  Gastrointestinal: Negative for abdominal pain, diarrhea, nausea and vomiting.  Genitourinary: Negative for dysuria.  Musculoskeletal: Positive for arthralgias. Negative for back pain, joint swelling, myalgias, neck pain and neck stiffness.  Skin: Positive for rash. Negative for color change, pallor and wound.  Neurological: Negative for dizziness,  weakness, light-headedness and headaches.     Physical Exam Updated Vital Signs BP 125/78 (BP Location: Right Arm)   Pulse (!) 112   Resp 20   SpO2 99%   Physical Exam  Constitutional: She is oriented to person, place, and time. She appears well-developed and well-nourished. No distress.  Pt appears anxious, but is in no acute distress  HENT:  Head: Normocephalic and atraumatic.  Eyes: Right eye exhibits no discharge. Left eye exhibits no discharge.  Cardiovascular: Normal rate, regular rhythm, normal heart sounds and intact distal pulses.  Pulmonary/Chest: Effort normal and breath sounds normal. No stridor. No respiratory distress. She has no wheezes. She has no rales.  Abdominal: Soft. Bowel sounds are normal. She exhibits no distension. There is no tenderness.  Musculoskeletal: She exhibits no edema or deformity.  Neurological: She is alert and oriented to person, place, and time. Coordination normal.  Skin: Skin is warm and dry.  Capillary refill takes less than 2 seconds. Rash noted. She is not diaphoretic.  Diffuse morbilliform rash over upper and lower extremities, very minimal lesions over the trunk, with a few around the perimeter of the face and on the left ear, a few target shaped lesions at the wrist and ankle with some purpura, no obvious vesicular lesions or pustules no desquamation, there are a few small lesions on the buccal mucosa, none in the posterior oropharynx (see photos below)  Psychiatric: She has a normal mood and affect. Her behavior is normal.  Nursing note and vitals reviewed.            ED Treatments / Results  Labs (all labs ordered are listed, but only abnormal results are displayed) Labs Reviewed  CBC WITH DIFFERENTIAL/PLATELET - Abnormal; Notable for the following components:      Result Value   Neutro Abs 9.1 (*)    Lymphs Abs 0.6 (*)    All other components within normal limits  COMPREHENSIVE METABOLIC PANEL - Abnormal; Notable for the  following components:   Glucose, Bld 124 (*)    All other components within normal limits  SEDIMENTATION RATE - Abnormal; Notable for the following components:   Sed Rate 42 (*)    All other components within normal limits  C-REACTIVE PROTEIN - Abnormal; Notable for the following components:   CRP 8.4 (*)    All other components within normal limits  URINALYSIS, ROUTINE W REFLEX MICROSCOPIC - Abnormal; Notable for the following components:   Color, Urine STRAW (*)    Specific Gravity, Urine 1.002 (*)    Hgb urine dipstick MODERATE (*)    Bacteria, UA RARE (*)    All other components within normal limits  CULTURE, BLOOD (ROUTINE X 2)  CULTURE, BLOOD (ROUTINE X 2)  HEPATITIS PANEL, ACUTE  RHEUMATOID FACTOR  ANTINUCLEAR ANTIBODIES, IFA  CYCLIC CITRUL PEPTIDE ANTIBODY, IGG/IGA  RPR  I-STAT CG4 LACTIC ACID, ED    EKG  EKG Interpretation None       Radiology Dg Chest 2 View  Result Date: 03/12/2017 CLINICAL DATA:  Pt c/o rash all over body starting x 3 days ago, no chest complaints EXAM: CHEST  2 VIEW COMPARISON:  None. FINDINGS: The heart size and mediastinal contours are within normal limits. Both lungs are clear. No pleural effusion or pneumothorax. The visualized skeletal structures are unremarkable. IMPRESSION: No active cardiopulmonary disease. Electronically Signed   By: Lajean Manes M.D.   On: 03/12/2017 10:47    Procedures Procedures (including critical care time)  Medications Ordered in ED Medications  sodium chloride 0.9 % bolus 1,000 mL (0 mLs Intravenous Stopped 03/12/17 1151)  ketorolac (TORADOL) 30 MG/ML injection 30 mg (30 mg Intravenous Given 03/12/17 1106)  methylPREDNISolone sodium succinate (SOLU-MEDROL) 125 mg/2 mL injection 125 mg (125 mg Intravenous Given 03/12/17 1151)     Initial Impression / Assessment and Plan / ED Course  I have reviewed the triage vital signs and the nursing notes.  Pertinent labs & imaging results that were available during my  care of the patient were reviewed by me and considered in my medical decision making (see chart for details).  Patient presents for evaluation of impressive rash, denies systemic symptoms, is not feeling ill, did have some intermittent sore throat and arthralgias which have resolved.  Normal vitals here in the emergency department, patient is tachycardic but has POTs and this is her baseline heart rate.  Unclear etiology of the rash.  Will send for  CBC, metabolic panel, lactic acid, blood cultures, chest x-ray and UA for general workup, as well as ESR and CRP.  Given that rash present on the palms and soles will also send RPR.  Patient's sister is a Industrial/product designer and recommended also getting hepatitis, rheumatoid factor, ANA and anti-CCP, although these labs will not come back today we will go ahead and order as they may help in determining the etiology of this rash.    Labs returned and overall reassuring, no leukocytosis, hemoglobin normal and platelets normal.  No acute electrolyte derangements, normal kidney and liver function, lactate normal, ESR slightly elevated from at PCP office 42 today, 31 on Friday.  Chest axillary clear, UA without signs of infection.  All other labs still pending.  Patient discussed with Dr. Lacinda Axon and Dr. Thomasene Lot who evaluated the patient as well.  Patient will definitely need a dermatology evaluation and biopsy, rash most likely consistent with erythema multiforme or a severe presentation of hand-foot-and-mouth, which can be quite common in the adult population.  Discussed with the patient and I feel she is stable for discharge home, she has follow-up arranged with [facility] dermatology tomorrow morning for evaluation and skin biopsy.  Strict return precautions discussed with the patient, she is provided with pain medicine to make her more comfortable so she is able to sleep.  At this time patient is stable for discharge home she is in agreement with plan.  Final  Clinical Impressions(s) / ED Diagnoses   Final diagnoses:  Rash    ED Discharge Orders        Ordered    oxyCODONE-acetaminophen (PERCOCET) 5-325 MG tablet  Every 4 hours PRN     03/12/17 1359       Janet Berlin 03/12/17 1906    Nat Christen, MD 03/15/17 1700

## 2017-03-12 NOTE — ED Triage Notes (Signed)
Patient here from home with complaints of full body rash. Reports seeing MD prescribed steroid taper, steroid injection, and vistaril. Patient reports extreme pain and states that rash is getting worse.

## 2017-03-13 DIAGNOSIS — L519 Erythema multiforme, unspecified: Secondary | ICD-10-CM | POA: Diagnosis not present

## 2017-03-13 DIAGNOSIS — L139 Bullous disorder, unspecified: Secondary | ICD-10-CM | POA: Diagnosis not present

## 2017-03-13 DIAGNOSIS — R21 Rash and other nonspecific skin eruption: Secondary | ICD-10-CM | POA: Diagnosis not present

## 2017-03-13 LAB — RPR: RPR Ser Ql: NONREACTIVE

## 2017-03-13 LAB — HEPATITIS PANEL, ACUTE
HCV Ab: <0.1 s/co ratio (ref 0.0–0.9)
Hep A IgM: NEGATIVE
Hep B C IgM: NEGATIVE
Hepatitis B Surface Ag: NEGATIVE

## 2017-03-13 LAB — CYCLIC CITRUL PEPTIDE ANTIBODY, IGG/IGA: CCP Antibodies IgG/IgA: 4 units (ref 0–19)

## 2017-03-14 LAB — ANTINUCLEAR ANTIBODIES, IFA: ANA Ab, IFA: NEGATIVE

## 2017-03-16 LAB — RHEUMATOID FACTOR: Rhuematoid fact SerPl-aCnc: 13.7 IU/mL (ref 0.0–13.9)

## 2017-03-17 LAB — CULTURE, BLOOD (ROUTINE X 2)
Culture: NO GROWTH
Culture: NO GROWTH
Special Requests: ADEQUATE
Special Requests: ADEQUATE

## 2017-03-20 ENCOUNTER — Encounter (INDEPENDENT_AMBULATORY_CARE_PROVIDER_SITE_OTHER): Payer: Self-pay

## 2017-03-21 ENCOUNTER — Encounter: Payer: Self-pay | Admitting: *Deleted

## 2017-04-05 DIAGNOSIS — Q796 Ehlers-Danlos syndrome: Secondary | ICD-10-CM | POA: Diagnosis not present

## 2017-04-05 DIAGNOSIS — G4700 Insomnia, unspecified: Secondary | ICD-10-CM | POA: Diagnosis not present

## 2017-04-05 DIAGNOSIS — M5090 Cervical disc disorder, unspecified, unspecified cervical region: Secondary | ICD-10-CM | POA: Diagnosis not present

## 2017-04-05 DIAGNOSIS — R000 Tachycardia, unspecified: Secondary | ICD-10-CM | POA: Diagnosis not present

## 2017-04-05 DIAGNOSIS — L519 Erythema multiforme, unspecified: Secondary | ICD-10-CM | POA: Diagnosis not present

## 2017-04-06 ENCOUNTER — Encounter: Payer: Self-pay | Admitting: Sports Medicine

## 2017-04-06 ENCOUNTER — Ambulatory Visit (INDEPENDENT_AMBULATORY_CARE_PROVIDER_SITE_OTHER): Payer: 59 | Admitting: Sports Medicine

## 2017-04-06 DIAGNOSIS — M5010 Cervical disc disorder with radiculopathy, unspecified cervical region: Secondary | ICD-10-CM | POA: Diagnosis not present

## 2017-04-06 MED ORDER — PREGABALIN 50 MG PO CAPS: 50.0000 mg | ORAL_CAPSULE | Freq: Two times a day (BID) | ORAL | 2 refills | Status: DC

## 2017-04-06 NOTE — Assessment & Plan Note (Signed)
Patient is here with similar symptoms to last visit.  Symptoms seem to be worse due to inability or hesitation to take most of her medications.  Doxepin has been discontinued secondary to a extreme allergic response. -Initiate Lyrica 50 mg at night times 5 days, then twice daily after that. -Scapular and upper extremity home exercises provided today. -Continue Robaxin and Ultram. -Follow-up 4 weeks  Next: Would consider increasing Lyrica dosing as this is a relatively small dose if symptoms improve but are still less than tolerable.  Consideration to initiate Cymbalta in place of the now discontinue doxepin if symptoms persist.

## 2017-04-06 NOTE — Progress Notes (Signed)
   HPI  CC: Follow-up cervical disc disorder and left shoulder pain Patient is here to follow-up regarding her cervical radiculopathy and left shoulder pain.  Patient states that she has had significant worsening over the past month that she was forced to discontinue her doxepin secondary to a severe allergic response.  Patient has been hesitant to take any of her other medications which is caused her to have a significant amount of discomfort.  Patient continues to have sharp stabbing pain along the left shoulder and scapula radiating down into her thoracic cage.  Medications/Interventions Tried: Tramadol, Robaxin, gabapentin, doxepin  New Allergy Rxn: Erythema multiforme f Doxepin  See HPI and/or previous note for associated ROS.  Objective: BP 114/70   Ht 4' 10.5" (1.486 m)   Wt 101 lb (45.8 kg)   BMI 20.75 kg/m  Gen: NAD, well groomed, a/o x3, normal affect.  CV: Well-perfused. Warm.  Resp: Non-labored.  Neuro: No gross coordination deficits. DTRs +2 bilaterally. Gait: Nonpathologic posture, unremarkable stride without signs of limp or balance issues. Neck/Scapula, Left: Inspection yields no evidence of erythema, ecchymosis, rash, muscle atrophy, or asymmetry.  Significant muscle tightness present along bilateral trapezius muscles. > on LT  No scapular winging on exam, but some irregular scapular motion noted on the left. Tenderness to palpation along the inferior edge of the scapula and nearby ribs.  Muscle strength of the rotator cuff 5/5 bilaterally.  Range of motion full with bilateral shoulders.  Neck range of motion full in extension and flexion, 70 degrees / 70 degrees and lateral flexion.  75 degrees / 70 degrees in rotation right and left respectively.   Assessment and plan:  Cervical disc disorder with radiculopathy of cervical region Patient is here with similar symptoms to last visit.  Symptoms seem to be worse due to inability or hesitation to take most of her  medications.  Doxepin has been discontinued secondary to a extreme allergic response. -Initiate Lyrica 50 mg at night times 5 days, then twice daily after that. -Scapular and upper extremity home exercises provided today. -Continue Robaxin and Ultram. -Follow-up 4 weeks  Next: Would consider increasing Lyrica dosing as this is a relatively small dose if symptoms improve but are still less than tolerable.  Consideration to initiate Cymbalta in place of the now discontinue doxepin if symptoms persist.    Meds ordered this encounter  Medications  . pregabalin (LYRICA) 50 MG capsule    Sig: Take 1 capsule (50 mg total) by mouth 2 (two) times daily.    Dispense:  60 capsule    Refill:  Aguilar, MD,MS [facility] 04/06/2017 5:58 PM

## 2017-04-06 NOTE — Patient Instructions (Addendum)
We are starting you on Lyrica: - Start with taking 1 tab at night for 3-5 days. Then increase this to taking 1 tab 2 times a day.

## 2017-04-27 ENCOUNTER — Other Ambulatory Visit: Payer: Self-pay

## 2017-04-27 ENCOUNTER — Other Ambulatory Visit: Payer: Self-pay | Admitting: Sports Medicine

## 2017-04-27 MED ORDER — TRAMADOL HCL 50 MG PO TABS: 50.0000 mg | ORAL_TABLET | Freq: Two times a day (BID) | ORAL | 2 refills | Status: DC

## 2017-04-29 ENCOUNTER — Other Ambulatory Visit: Payer: Self-pay | Admitting: Sports Medicine

## 2017-05-01 ENCOUNTER — Encounter: Payer: Self-pay | Admitting: Sports Medicine

## 2017-05-09 ENCOUNTER — Encounter: Payer: Self-pay | Admitting: Sports Medicine

## 2017-05-09 ENCOUNTER — Ambulatory Visit (INDEPENDENT_AMBULATORY_CARE_PROVIDER_SITE_OTHER): Payer: 59 | Admitting: Sports Medicine

## 2017-05-09 VITALS — BP 98/70 | Ht 58.5 in | Wt 100.0 lb

## 2017-05-09 DIAGNOSIS — M533 Sacrococcygeal disorders, not elsewhere classified: Secondary | ICD-10-CM

## 2017-05-09 DIAGNOSIS — G8929 Other chronic pain: Secondary | ICD-10-CM

## 2017-05-09 DIAGNOSIS — M5010 Cervical disc disorder with radiculopathy, unspecified cervical region: Secondary | ICD-10-CM | POA: Diagnosis not present

## 2017-05-09 MED ORDER — METHYLPREDNISOLONE ACETATE 40 MG/ML IJ SUSP
40.0000 mg | Freq: Once | INTRAMUSCULAR | Status: AC
  Administered 2017-05-09: 40 mg via INTRA_ARTICULAR

## 2017-05-09 NOTE — Assessment & Plan Note (Addendum)
Patient presents today to follow up on chronic shoulder and neck pain. Patient reports pain is more severe around the left scapula and neck region. She has noticed very little improvement with Lyrica. Continue to take Ultram and robaxin. Some improvement noted with PT and pilates.  Patient has a know history of cervical disc herniation which is causing significant radiculopathy in the settings of Ehlers- Danlos. Given no change with the addition of Lyrica, will discontinue. Patient will continue with PT, pilates and also recommended neck and trapezius deep ice massage three times a day. Patient will continue with tramadol and robaxin. Hope is to reduce incidence of muscle spasms to help decrease chance of muscle traction on nerves which have been causing pain. Patient is in agreement with plan.

## 2017-05-09 NOTE — Progress Notes (Signed)
Subjective:    Patient ID: Marissa [name], female    DOB: [birth year], 52 y.o.   MRN: [id number]   CC: Follow up for neck/shoulder pain and new SI joint pain  HPI: Patient is a 52 yo female with a past medical history significant for ehlers Danlos who presents today to follow up on neck/shoulder pain as well as a new complaint of SI joint pain.  Shoulder/Neck pain: Patient reports shoulder pain has not improved much on left side since last office visit. She is still experiencing sharp pain in her left scapular region preventing her from using her left arm. Patient has been taking lyrica, ultram and robaxin with minimal improvement in symptoms. Patient has been doing physical therapy with some pilates which she reports had helped her with the pain on the right side. Patient denies any numbness or tingling.  Lyrica made little change.  Pilates does help and icing helps.  Right SI joint pain: Patient reports yesterday while working with physical therapy she experienced a sharp excruciating painaround her right SI joint. Patient heard and felt a "pop". Patient reports that injury occur while she was about to lay on the peanut (exercise ball). Since then, she has been unable to sit due to severe pain. She reports that pain is reminiscent of her gluteus max injury/rupture a few years ago. Patient denies any issues with ambulation, but endorses diffuse right sided buttock pain.  Note I have treated her for SI joint dysfunction in past  Smoking status reviewed   ROS: all other systems were reviewed and are negative other than in the HPI  No central LBP No sciatica but gets tingtling down RT lateral leg Pain worse with lying on RT side  Past Medical History:  Diagnosis Date  . Ehlers-Danlos syndrome   . History of pituitary tumor   . Ocular migraine     Past Surgical History:  Procedure Laterality Date  . KNEE SURGERY N/A 2012  . SHOULDER SURGERY N/A 2009  . UTERINE FIBROID SURGERY N/A       Past medical history, surgical, family, and social history reviewed and updated in the EMR as appropriate.  Objective:  BP 98/70   Ht 4' 10.5" (1.486 m)   Wt 100 lb (45.4 kg)   BMI 20.54 kg/m   Vitals and nursing note reviewed  General: NAD, pleasant, able to participate in exam SI Joint: Tender to palpation on RT SI joint, no bruises, ecchymosis swelling noted. Range of motion of Hip is full, normal FADIR and FABER. Strength 5/5. Pelvis rock reveals that RT SI joint has decreased movement  Note - SI joint stretches cause sharp pain at SIJ   Increase joint laxity. Neurovascularly intact. Neck/trapezius exam: No swelling, ecchymosis or bruised. Tender to palpation around trapezius and scapula with L>R. No scapular winging. Flexion and extension within normal limits. Decrease Left and right rotation. Strength 5/5. No instability noted.  Assessment & Plan:    Cervical disc disorder with radiculopathy of cervical region Patient presents today to follow up on chronic shoulder and neck pain. Patient reports pain is more severe around the left scapula and neck region. She has noticed very little improvement with Lyrica. Continue to take Ultram and robaxin. Some improvement noted with PT and pilates.  Patient has a know history of cervical disc herniation which is causing significant radiculopathy in the settings of Ehlers- Danlos. Given no change with the addition of Lyrica, will discontinue. Patient will continue with PT, pilates and  also recommended neck and trapezius deep ice massage three times a day. Patient will continue with tramadol and robaxin. Hope is to reduce incidence of muscle spasms to help decrease chance of muscle traction on nerves which have been causing pain. Patient is in agreement with plan.  Chronic right SI joint pain Acute on chronic right SI joint pain. Patient with significant tenderness with palpation on the right SI joint also complaint of right gluteus max and  med pain. Strength and ROM are within normal limits. Patient has increased joints laxity in the setting of Ehlers-Danlos and might have dislocated her SI joint given pop reported. This event will result in inflammation at the SI joint attachment point for the gluteus muscles as well as possible fascial intrapment within the joint causing pain. Given significant discomfort, will inject patient with steroids. Patient was given home exercises for SI joint stretching to help realign joint in correct position and alleviate pain. Patient will follow up in 4 weeks.  Procedure:  Injection of RT SI Joint Consent obtained and verified. Time-out conducted. Noted no overlying erythema, induration, or other signs of local infection. Skin prepped in a sterile fashion. Topical analgesic spray: Ethyl chloride. Completed without difficulty. Meds: 1 cc. Solumedrol and 4 cc lidocaine 1% Pain immediately improved suggesting accurate placement of the medication. Advised to call if fevers/chills, erythema, induration, drainage, or persistent bleeding.  Ila Mcgill, MD    Marjie Skiff, MD [facility] PGY-2  I observed and examined the patient with the resident and agree with assessment and plan.  Note reviewed and modified by me. Stefanie Libel, MD

## 2017-05-09 NOTE — Assessment & Plan Note (Addendum)
Acute on chronic right SI joint pain. Patient with significant tenderness with palpation on the right SI joint also complaint of right gluteus max and med pain. Strength and ROM are within normal limits. Patient has increased joints laxity in the setting of Ehlers-Danlos and might have subluxed her SI joint given pop reported. This event will result in inflammation at the SI joint attachment point for the gluteus muscles as well as possible fascial intrapment within the joint causing pain. Given significant discomfort, will inject patient with steroids. Patient was given home exercises for SI joint stretching to help realign joint in correct position and alleviate pain. Patient will follow up in 4 weeks.

## 2017-06-13 ENCOUNTER — Encounter: Payer: Self-pay | Admitting: Sports Medicine

## 2017-06-13 ENCOUNTER — Ambulatory Visit (INDEPENDENT_AMBULATORY_CARE_PROVIDER_SITE_OTHER): Payer: 59 | Admitting: Sports Medicine

## 2017-06-13 DIAGNOSIS — M533 Sacrococcygeal disorders, not elsewhere classified: Secondary | ICD-10-CM

## 2017-06-13 DIAGNOSIS — G8929 Other chronic pain: Secondary | ICD-10-CM | POA: Diagnosis not present

## 2017-06-13 NOTE — Assessment & Plan Note (Signed)
I think we need to try to get her on another medication that will suppress her chronic pain pattern  We will start with low-dose trazodone This should provide good muscle relaxation as well as help with insomnia  She needs to be careful to watch for any type of skin reaction Return evaluation 1 month

## 2017-06-13 NOTE — Progress Notes (Signed)
Chief complaint right sacroiliac joint pain  Patient with Drue Dun syndrome Over the past 2 months has had recurrent flares with right SI joint pain These are severe enough that they keep her from sleeping at night April 23 I did an SI joint injection This got rid of her most severe pain and locking sensation  We have tried both gabapentin and Lyrica for her chronic musculoskeletal pain Neither one had much effect  Currently using periodic tramadol, Robaxin and Advil  In the past she had better control of musculoskeletal pain on doxepin but had a reaction with Stevens-Johnson syndrome  Review of systems Insomnia triggered by musculoskeletal pain Periodic tachycardia improved with Toprol but very tired when using normal dose Periodic teeth fractures thought related to Drue Dun  Physical exam Patient is in no acute distress BP 100/72   Ht 4\' 10"  (1.473 m)   Wt 100 lb (45.4 kg)   BMI 20.90 kg/m   Both SI joints are freely movable Right SI joint reveals some mild tenderness to palpation There is a large SI joint nodule on the right Central lumbar spine is not tender Full range of motion of the lumbar spine No weakness in the lower extremities

## 2017-06-14 ENCOUNTER — Other Ambulatory Visit: Payer: Self-pay

## 2017-06-14 MED ORDER — AMBULATORY NON FORMULARY MEDICATION: 0 refills | Status: DC

## 2017-06-20 ENCOUNTER — Encounter: Payer: Self-pay | Admitting: *Deleted

## 2017-06-20 ENCOUNTER — Encounter: Payer: Self-pay | Admitting: Sports Medicine

## 2017-06-28 ENCOUNTER — Encounter: Payer: Self-pay | Admitting: Sports Medicine

## 2017-07-04 ENCOUNTER — Encounter: Payer: Self-pay | Admitting: Sports Medicine

## 2017-07-10 ENCOUNTER — Encounter: Payer: Self-pay | Admitting: Sports Medicine

## 2017-07-11 ENCOUNTER — Other Ambulatory Visit: Payer: Self-pay | Admitting: *Deleted

## 2017-07-11 MED ORDER — ZOLPIDEM TARTRATE 10 MG PO TABS: ORAL_TABLET | ORAL | 1 refills | Status: DC

## 2017-07-13 ENCOUNTER — Encounter: Payer: Self-pay | Admitting: Sports Medicine

## 2017-07-13 ENCOUNTER — Ambulatory Visit (INDEPENDENT_AMBULATORY_CARE_PROVIDER_SITE_OTHER): Payer: 59 | Admitting: Sports Medicine

## 2017-07-13 ENCOUNTER — Other Ambulatory Visit: Payer: Self-pay | Admitting: Obstetrics and Gynecology

## 2017-07-13 DIAGNOSIS — Q796 Ehlers-Danlos syndrome: Secondary | ICD-10-CM

## 2017-07-13 DIAGNOSIS — Q7962 Hypermobile Ehlers-Danlos syndrome: Secondary | ICD-10-CM

## 2017-07-13 DIAGNOSIS — Z1231 Encounter for screening mammogram for malignant neoplasm of breast: Secondary | ICD-10-CM

## 2017-07-13 NOTE — Assessment & Plan Note (Signed)
I think our best option is [place] although not sure how good this will do as a chronic medication  We will also try aqua therapy with float vest  Cont. pilates with limits  After 2 to 3 days of ibuprofen may need to switch to tramadol for a day or two to ease stomach issues  Reck 2 mons.

## 2017-07-13 NOTE — Progress Notes (Signed)
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 Spasm along left lat dorsi RT SIJ pops with easy motion and is painful Left SIJ with good motion but no pain

## 2017-07-21 DIAGNOSIS — L509 Urticaria, unspecified: Secondary | ICD-10-CM | POA: Diagnosis not present

## 2017-08-29 ENCOUNTER — Encounter (INDEPENDENT_AMBULATORY_CARE_PROVIDER_SITE_OTHER): Payer: Self-pay | Admitting: Family Medicine

## 2017-08-29 ENCOUNTER — Ambulatory Visit (INDEPENDENT_AMBULATORY_CARE_PROVIDER_SITE_OTHER): Payer: 59

## 2017-08-29 ENCOUNTER — Ambulatory Visit (INDEPENDENT_AMBULATORY_CARE_PROVIDER_SITE_OTHER): Payer: No Typology Code available for payment source | Admitting: Family Medicine

## 2017-08-29 ENCOUNTER — Ambulatory Visit
Admission: RE | Admit: 2017-08-29 | Discharge: 2017-08-29 | Disposition: A | Discharge status: Home / Self Care | Payer: 59 | Source: Ambulatory Visit | Attending: Obstetrics and Gynecology | Admitting: Obstetrics and Gynecology

## 2017-08-29 ENCOUNTER — Other Ambulatory Visit: Payer: Self-pay

## 2017-08-29 ENCOUNTER — Ambulatory Visit (INDEPENDENT_AMBULATORY_CARE_PROVIDER_SITE_OTHER): Payer: 59 | Admitting: Family Medicine

## 2017-08-29 DIAGNOSIS — M542 Cervicalgia: Secondary | ICD-10-CM

## 2017-08-29 DIAGNOSIS — G894 Chronic pain syndrome: Secondary | ICD-10-CM | POA: Diagnosis not present

## 2017-08-29 DIAGNOSIS — I951 Orthostatic hypotension: Secondary | ICD-10-CM

## 2017-08-29 DIAGNOSIS — G90A Postural orthostatic tachycardia syndrome (POTS): Secondary | ICD-10-CM | POA: Insufficient documentation

## 2017-08-29 DIAGNOSIS — I498 Other specified cardiac arrhythmias: Secondary | ICD-10-CM

## 2017-08-29 DIAGNOSIS — R000 Tachycardia, unspecified: Secondary | ICD-10-CM | POA: Diagnosis not present

## 2017-08-29 DIAGNOSIS — Z1231 Encounter for screening mammogram for malignant neoplasm of breast: Secondary | ICD-10-CM

## 2017-08-29 DIAGNOSIS — E559 Vitamin D deficiency, unspecified: Secondary | ICD-10-CM | POA: Diagnosis not present

## 2017-08-29 DIAGNOSIS — R5382 Chronic fatigue, unspecified: Secondary | ICD-10-CM | POA: Diagnosis not present

## 2017-08-29 DIAGNOSIS — R69 Illness, unspecified: Secondary | ICD-10-CM | POA: Diagnosis not present

## 2017-08-29 DIAGNOSIS — Q796 Ehlers-Danlos syndrome, unspecified: Secondary | ICD-10-CM

## 2017-08-29 DIAGNOSIS — F5104 Psychophysiologic insomnia: Secondary | ICD-10-CM

## 2017-08-29 MED ORDER — TIZANIDINE HCL 2 MG PO TABS: 2.0000 mg | ORAL_TABLET | Freq: Every evening | ORAL | 6 refills | Status: DC | PRN

## 2017-08-29 NOTE — Progress Notes (Signed)
Office Visit Note   Patient: Marissa [name]           Date of Birth: November 27, 1965           MRN: [id number] Visit Date: 08/29/2017 Requested by: Kelton Pillar, MD [address] [place], [place] [address] PCP: Kelton Pillar, MD  Subjective: Chief Complaint  Patient presents with  . establish care for Ehlers-Danlos Syndrome Hypermobility type    HPI: She is seen at the request of Kym Groom.  She has Ehlers-Danlos syndrome with hypermobility.  Years ago she started having shoulder problems and eventually had left shoulder surgery.  Unfortunately surgical procedure failed and she continued to have pain.  Eventually she developed a cervical disc herniation and underwent C5-6 discectomy and fusion.  Once again, she did not do well with this surgery.  She has struggled with chronic pain in her neck and multiple parts of her body.  Chronic pain keeps her from sleeping.  The only time she feels comfortable is when she is lying flat, otherwise she feels like her head is unstable on her neck.  She has tried various sleep aids with minimal improvement.  Her quality of life is significantly diminished due to her symptoms.  She goes to physical therapy on a regular basis and this gives her temporary relief.  She tries to cautiously exercise as well.  She had a possible reaction to doxepin and was diagnosed with erythema multiforme 4 days after taking it.  Unfortunately the medication was helping her prior to having to stop it.  She does not eat healthfully.  She has not noticed any dietary triggers to her symptoms but she has not tried any dietary changes.  In the past she had elevated C-reactive protein and sed rate, but other autoimmune markers were normal.  She has not had those tests repeated.  She does not have menstrual cycles.  She has not had them since her teenage years.  She has not had any pregnancies.  She is followed by gynecology.  Bone density test was surprisingly  normal a couple years ago.  She has a history of POTS.  She tried a beta-blocker which made her feel worse.  She is not currently using anything for this.  She has seen a cardiologist in the past.    PMFS History: Patient Active Problem List   Diagnosis Date Noted  . Wrist pain, acute 06/02/2016  . Enthesopathy of right shoulder 01/28/2016  . Right shoulder pain 01/28/2016  . Family history of early CAD 11/30/2015  . Cervical disc disorder with radiculopathy of cervical region 05/13/2014  . Chronic right SI joint pain 08/28/2013  . Hip pain, right 06/25/2013  . Elbow pain, right 02/13/2013  . Bunionette 01/07/2013  . Ehlers-Danlos syndrome, type 3 11/08/2012  . Unspecified visual disturbance 04/19/2012  . 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 Ehlers-Danlos syndrome   . History of pituitary tumor   . Ocular migraine     Family History  Problem Relation Age of Onset  . Arthritis Mother   . Atrial fibrillation Mother   . Arthritis Father   . Diabetes Father   . Heart disease Father   . Hypertension Father   . Heart failure Father   . Breast cancer Neg Hx     Past Surgical History:  Procedure Laterality Date  . KNEE SURGERY N/A 2012  . SHOULDER SURGERY N/A 2009  .  UTERINE FIBROID SURGERY N/A    Social History   Occupational History    Employer: [employer]  Tobacco Use  . Smoking status: Never Smoker  . Smokeless tobacco: Never Used  Substance and Sexual Activity  . Alcohol use: No  . Drug use: No  . Sexual activity: Yes    Birth control/protection: Pill                ROS: Other systems reviewed were negative.  Objective: Vital Signs: There were no vitals taken for this visit.  Physical Exam: She has full range of motion of her neck.  Well-healed anterior cervical surgical scar.  Multiple tender trigger points in her neck and upper back.  Upper extremity strength and reflexes are  normal. CV: Regular rate and rhythm without murmurs, rubs, or gallops.  No peripheral edema.  2+ radial and posterior tibial pulses. Lungs: Clear to auscultation throughout with no wheezing or areas of consolidation. Abd: Bowel sounds are active, no hepatosplenomegaly or masses.  Soft and nontender.  No audible bruits.  No evidence of ascites. EXT: 2+ upper and lower DTRs.  Longitudinal ridging and some of her fingernails.   Imaging: X-rays today show solid fusion at C5-6 with no significant instability on flexion and extension.  Dr. Louanne Skye reviewed the films as well.  Assessment & Plan: Visit Diagnoses: Neck pain - Plan: DG Cervical Spine With Flex & Extend, XR Cervical Spine 2 or 3 views  Chronic insomnia  Chronic fatigue - Plan: Thyroid Panel With TSH, VITAMIN D 25 Hydroxy (Vit-D Deficiency, Fractures), Thyroid peroxidase antibody, CRP High sensitivity  Chronic pain syndrome - Plan: Thyroid Panel With TSH, VITAMIN D 25 Hydroxy (Vit-D Deficiency, Fractures), Thyroid peroxidase antibody, CRP High sensitivity  Ehlers-Danlos disease  Plan:   1.  Trial of Zanaflex for her pain.  Continue with physical therapy.  Consider repeat cervical MRI scan in the future and possibly epidural injections if symptoms worsen. 2.  Labs to evaluate. 3.  Consider a trial of a food elimination diet.  Follow-Up Instructions: No follow-ups on file.      Procedures: No procedures performed  Time:  We spent 50 minutes discussing current and past history, and reviewing records.

## 2017-08-30 ENCOUNTER — Telehealth (INDEPENDENT_AMBULATORY_CARE_PROVIDER_SITE_OTHER): Payer: Self-pay | Admitting: Family Medicine

## 2017-08-30 LAB — THYROID PANEL WITH TSH
Free Thyroxine Index: 2.2 (ref 1.4–3.8)
T3 Uptake: 27 % (ref 22–35)
T4, Total: 8.3 ug/dL (ref 5.1–11.9)
TSH: 4.13 mIU/L

## 2017-08-30 LAB — THYROID PEROXIDASE ANTIBODY: Thyroperoxidase Ab SerPl-aCnc: 1 IU/mL (ref <9)

## 2017-08-30 LAB — HIGH SENSITIVITY CRP: hs-CRP: 5.2 mg/L — ABNORMAL HIGH

## 2017-08-30 LAB — VITAMIN D 25 HYDROXY (VIT D DEFICIENCY, FRACTURES): Vit D, 25-Hydroxy: 46 ng/mL (ref 30–100)

## 2017-08-30 NOTE — Telephone Encounter (Signed)
Labs are notable for the following:  Thyroid antibodies are still pending.  Thyroid TSH is technically in normal range at 4.13, but in optimally functioning thyroid would have a TSH of around 0.5-1.0.  I am suspicious that you have some thyroid dysfunction.  We will await results of antibody test.  C-reactive protein, inflammation marker, remains elevated at 5.2.  This can be from a variety of factors, but as we discussed, diet can sometimes be a cause of this.  In addition to the website I sent, I will also email some other information.  I would suggest trying these things for a while, and rechecking C-reactive protein in a few months.  [place]

## 2017-08-30 NOTE — Telephone Encounter (Signed)
Thyroid antibodies were negative/normal.  [facility]

## 2017-09-06 ENCOUNTER — Ambulatory Visit: Payer: 59 | Admitting: Cardiology

## 2017-09-06 DIAGNOSIS — Z13820 Encounter for screening for osteoporosis: Secondary | ICD-10-CM | POA: Diagnosis not present

## 2017-09-13 DIAGNOSIS — D225 Melanocytic nevi of trunk: Secondary | ICD-10-CM | POA: Diagnosis not present

## 2017-09-13 DIAGNOSIS — L503 Dermatographic urticaria: Secondary | ICD-10-CM | POA: Diagnosis not present

## 2017-09-13 DIAGNOSIS — H01119 Allergic dermatitis of unspecified eye, unspecified eyelid: Secondary | ICD-10-CM | POA: Diagnosis not present

## 2017-09-13 DIAGNOSIS — D2271 Melanocytic nevi of right lower limb, including hip: Secondary | ICD-10-CM | POA: Diagnosis not present

## 2017-09-13 DIAGNOSIS — Z86018 Personal history of other benign neoplasm: Secondary | ICD-10-CM | POA: Diagnosis not present

## 2017-09-13 DIAGNOSIS — D2262 Melanocytic nevi of left upper limb, including shoulder: Secondary | ICD-10-CM | POA: Diagnosis not present

## 2017-09-13 DIAGNOSIS — D485 Neoplasm of uncertain behavior of skin: Secondary | ICD-10-CM | POA: Diagnosis not present

## 2017-09-13 DIAGNOSIS — L821 Other seborrheic keratosis: Secondary | ICD-10-CM | POA: Diagnosis not present

## 2017-09-25 ENCOUNTER — Encounter: Payer: Self-pay | Admitting: Cardiology

## 2017-09-27 DIAGNOSIS — L503 Dermatographic urticaria: Secondary | ICD-10-CM | POA: Diagnosis not present

## 2017-09-28 ENCOUNTER — Encounter: Payer: Self-pay | Admitting: Sports Medicine

## 2017-09-28 ENCOUNTER — Ambulatory Visit (INDEPENDENT_AMBULATORY_CARE_PROVIDER_SITE_OTHER): Payer: 59 | Admitting: Sports Medicine

## 2017-09-28 DIAGNOSIS — Q796 Ehlers-Danlos syndrome, unspecified: Secondary | ICD-10-CM

## 2017-09-28 DIAGNOSIS — G8929 Other chronic pain: Secondary | ICD-10-CM

## 2017-09-28 DIAGNOSIS — M25511 Pain in right shoulder: Secondary | ICD-10-CM

## 2017-09-28 NOTE — Patient Instructions (Addendum)
It was a pleasure to see you today.  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 Continue to work with dermatology to find symptomatic management with Singulair.   You also have chronic shoulder anterior and inferior subluxation. We recommend exercises to push back the shoulder. Holding your arm at 90 degrees, press your right shoulder blade to the wall and hold for 10 seconds. You can continue to do other arm exercises, but keep your should blades supported during any arm exercises. Do not let the shoulder hang out of position for too long. You can rest the arm in your pocket or on a surface. I would recommend a shoulder stabilization sleeve to wear at night  We are glad you are sleeping better with changes in your muscle relaxant

## 2017-09-28 NOTE — Progress Notes (Signed)
Subjective:    CC: chronic neck and shoulder pain with sleep disrupion; skin reaction  HPI:  Marissa [name] is a 52 year old female with a history of Ehlers-Danlos syndrome here for follow up of joint pain related to that condition.  Since her last visit, she saw Eunice Blase with orthopedics, who changed her muscle relaxant to Zanaflex 2-4 mg at bedtime. He also added a melatonin and L-thymine supplement. Since then, her regular nighttime shoulder and neck pain has been better controled and her sleep has improved (getting 5-6 hours per night)  She continues to receive PT for pilates and get needling for chronic effects of joint laxity.  She does report a new anterior "droop" of her right shoulder that was noticed by PT, who was initially concerned about a biceps injury. She has a history of a pec minor tear in the past.   Lastly, she has been experiencing allergic reactions to tape, photosensitivity and other skin sensitivity that is not a normal problem for her. Her primary skin rashes have consisted on wheals and welts. She saw dermatologist for this, who put her on an antihistamine (Xyzal) for this, then has recently added singulair. The dermatologist did not feel that this was Ehlers-Danlos related.  I reviewed the past medical history, family history, social history, surgical history, and allergies today and no changes were needed.  Please see the problem list section below in epic for further details.  Past Medical History: Past Medical History:  Diagnosis Date  . Ehlers-Danlos syndrome   . History of pituitary tumor   . Ocular migraine    Past Surgical History: Past Surgical History:  Procedure Laterality Date  . KNEE SURGERY N/A 2012  . SHOULDER SURGERY N/A 2009  . UTERINE FIBROID SURGERY N/A    Social History: Social History   Socioeconomic History  . Marital status: Single    Spouse name: Not on file  . Number of children: 0  . Years of education: Masters  . Highest  education level: Not on file  Occupational History    Employer: [employer]  Social Needs  . Financial resource strain: Not on file  . Food insecurity:    Worry: Not on file    Inability: Not on file  . Transportation needs:    Medical: Not on file    Non-medical: Not on file  Tobacco Use  . Smoking status: Never Smoker  . Smokeless tobacco: Never Used  Substance and Sexual Activity  . Alcohol use: No  . Drug use: No  . Sexual activity: Yes    Birth control/protection: Pill  Lifestyle  . Physical activity:    Days per week: Not on file    Minutes per session: Not on file  . Stress: Not on file  Relationships  . Social connections:    Talks on phone: Not on file    Gets together: Not on file    Attends religious service: Not on file    Active member of club or organization: Not on file    Attends meetings of clubs or organizations: Not on file    Relationship status: Not on file  Other Topics Concern  . Not on file  Social History Narrative  . Not on file   Family History: Family History  Problem Relation Age of Onset  . Arthritis Mother   . Atrial fibrillation Mother   . Arthritis Father   . Diabetes Father   . Heart disease Father   . Hypertension  Father   . Heart failure Father   . Breast cancer Neg Hx    Allergies: Allergies  Allergen Reactions  . Macrobid [employer] Macro]   . Doxepin Rash   Medications: See med rec.  Review of Systems: No fevers, chills, night sweats, weight loss, chest pain, or shortness of breath.   Objective:    Vitals:   09/28/17 1333  BP: 118/70  Weight: 100 lb (45.4 kg)  Height: 4\' 10"  (1.473 m)   General: Well Developed, well nourished, and in no acute distress.  Skin: + dermatographia on the back; absent on the forearm MSK:   Shoulder: Anterior and inferior subluxation of right shoulder joint No obvious deformity or asymmetry. No bruising. No swelling No winging of scapula on the right, but  slight asymmetry Slight spasm along right trapezius Increased A-P joint mobility of R shoulder No TTP. Specifically no TTP over [facility] joint Full ROM grossly NV intact distally  Impression and Recommendations:    Ehlers-Danlos Syndrome - new shoulder laxity that is concerning for possible irritation of previous pectoris minor injury - Continue zanaflex with melatonin and ambien as adjuncts for sleep - Start performing exercises to push scapula against the wall - Recommended shoulder stabilization sleeve - Do not recommend allowing shoulder to remain subluxed for too long; discussed ways to support it while standing and recommend conducting all arm exercises with the back against the wall - Return in 2 months  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  Note reviewed and modified by me. Stefanie Libel, MD  ___________________________________________

## 2017-09-28 NOTE — Assessment & Plan Note (Signed)
Pilates has seemed to help several joints  More instability at RT shoulder may relate to prior pec minor tear  Suspect some chronic labral damage as well

## 2017-09-28 NOTE — Assessment & Plan Note (Signed)
She is showing more laxity and chronic recurrent subluxation  Isometric exercises Support scapula for rehab  Trial with compression sleeve for shoulder position  Reck 6 wks

## 2017-09-30 ENCOUNTER — Encounter (INDEPENDENT_AMBULATORY_CARE_PROVIDER_SITE_OTHER): Payer: Self-pay | Admitting: Family Medicine

## 2017-10-05 DIAGNOSIS — Z23 Encounter for immunization: Secondary | ICD-10-CM | POA: Diagnosis not present

## 2017-10-24 ENCOUNTER — Other Ambulatory Visit: Payer: Self-pay

## 2017-10-24 MED ORDER — ZOLPIDEM TARTRATE 10 MG PO TABS: ORAL_TABLET | ORAL | 1 refills | Status: DC

## 2017-10-30 DIAGNOSIS — Z23 Encounter for immunization: Secondary | ICD-10-CM | POA: Diagnosis not present

## 2017-11-16 ENCOUNTER — Ambulatory Visit (INDEPENDENT_AMBULATORY_CARE_PROVIDER_SITE_OTHER): Payer: 59 | Admitting: Cardiology

## 2017-11-16 VITALS — BP 116/82 | HR 79 | Ht 58.5 in | Wt 108.0 lb

## 2017-11-16 DIAGNOSIS — R002 Palpitations: Secondary | ICD-10-CM | POA: Diagnosis not present

## 2017-11-16 MED ORDER — METOPROLOL SUCCINATE ER 25 MG PO TB24: 25.0000 mg | ORAL_TABLET | Freq: Every day | ORAL | 3 refills | Status: DC | PRN

## 2017-11-16 NOTE — Progress Notes (Signed)
Cardiology Office Note    Date:  11/16/2017   ID:  LYNETT BRASIL, DOB Jun 02, 1965, MRN [id number]  PCP:  Kelton Pillar, MD  Cardiologist:  Ena Dawley, MD   Chief complain: Establish cardiology care with dg of Ehlers-Danlos syndrome  History of Present Illness:  Marissa [name] is a 52 y.o. 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  The patient has a diagnosis of Ehlers-Danlos syndrome, she has tremendous orthopedic problems followed by Dr Stefanie Libel.  She has been experiencing palpitations and states that her heart rate is always high despite good hydration. She is not active in sports as she is limited with orthopedic issues, but walks most days without SOB or chest pain. No syncope.  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 She had an echocardiogram approximately 10 years ago and was told it was normal.  She has never smoked, doesn't have any children.   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 She has been going through physical therapy and using a lot of pain medication control. As a result she was experiencing dizziness and couldn't tolerate Toprol-XL that she stopped taking in January 2018. She has been experiencing a lot of palpitations and has been tachycardic almost all the time.On one occasion while in the pilate class she experienced dyspnea on exertion.  11/16/2017, the patient has developed erythema multiforme in February 2019 with severe presentation and blistering, extensive evaluation was performed including rheumatologic panel biopsies for vasculitis and the only abnormal lab was elevated CRP at 8, repeated CRP in August was  5.  The presentation was blamed on pain medication that was discontinued, she was treated with steroids, now completely resolved.  She is dealing with a lot of problems related to Ehlers-Danlos syndrome, including hip subluxation, neck pain secondary to dislocated disc, bilateral shoulder pain and is very limited to exercises but still continues to do frequent Pilates.  She denies any chest pain shortness of breath dizziness or syncope.  She has occasional palpitations, she does not take metoprolol as there was concern that this might be causing her presentation as well..  Past Medical History:  Diagnosis Date  . Ehlers-Danlos syndrome   . History of pituitary tumor   . Ocular migraine     Past Surgical History:  Procedure Laterality Date  . KNEE SURGERY N/A 2012  . SHOULDER SURGERY N/A 2009  . UTERINE FIBROID SURGERY N/A     Current Medications: Outpatient Medications Prior to Visit  Medication Sig Dispense Refill  . levocetirizine (XYZAL) 5 MG tablet Take 5 mg by mouth every evening.    . montelukast (SINGULAIR) 10 MG tablet 10 mg.    . norgestrel-ethinyl estradiol (CRYSELLE-28) 0.3-30 MG-MCG tablet Take active pills daily/continuosly, skip placebo pills    . tiZANidine (ZANAFLEX) 2 MG tablet Take 1-2 tablets (2-4 mg total) by mouth at bedtime as needed for muscle spasms. 60 tablet 6  . traMADol (ULTRAM) 50 MG tablet Take 1 tablet (50 mg total) by mouth 2 (two) times daily. 60 tablet 2  . zolpidem (AMBIEN) 10 MG tablet Take 1/2-1 tab by mouth at bedtime as needed 30 tablet 1  .  AMBULATORY NON FORMULARY MEDICATION Trazodone 25 mg  Take 1 tab at night. (Patient not taking: Reported on 08/29/2017) 30 tablet 0  . diphenhydramine-acetaminophen (TYLENOL PM) 25-500 MG TABS Take 1 tablet by mouth at bedtime as needed. Reported on 08/04/2015    . methocarbamol (ROBAXIN) 500 MG tablet TAKE 1 TABLET BY MOUTH TWICE A DAY AS NEEDED (Patient not taking: Reported on 11/16/2017) 30 tablet 3  .  metoprolol succinate (TOPROL-XL) 25 MG 24 hr tablet Take 0.5 tablets (12.5 mg total) by mouth daily. (Patient not taking: Reported on 11/16/2017) 30 tablet 6   No facility-administered medications prior to visit.      Allergies:   Macrobid [nitrofurantoin monohyd macro] and Doxepin   Social History   Socioeconomic History  . Marital status: Single    Spouse name: Not on file  . Number of children: 0  . Years of education: Masters  . Highest education level: Not on file  Occupational History    Employer: [employer]  Social Needs  . Financial resource strain: Not on file  . Food insecurity:    Worry: Not on file    Inability: Not on file  . Transportation needs:    Medical: Not on file    Non-medical: Not on file  Tobacco Use  . Smoking status: Never Smoker  . Smokeless tobacco: Never Used  Substance and Sexual Activity  . Alcohol use: No  . Drug use: No  . Sexual activity: Yes    Birth control/protection: Pill  Lifestyle  . Physical activity:    Days per week: Not on file    Minutes per session: Not on file  . Stress: Not on file  Relationships  . Social connections:    Talks on phone: Not on file    Gets together: Not on file    Attends religious service: Not on file    Active member of club or organization: Not on file    Attends meetings of clubs or organizations: Not on file    Relationship status: Not on file  Other Topics Concern  . Not on file  Social History Narrative  . Not on file    Family History:  The patient's family history includes Arthritis in her father and mother; Atrial fibrillation in her mother; Diabetes in her father; Heart disease in her father; Heart failure in her father; Hypertension in her father.   ROS:   Please see the history of present illness.    ROS All other systems reviewed and are negative.  PHYSICAL EXAM:   VS:  BP 116/82   Pulse 79   Ht 4' 10.5" (1.486 m)   Wt 108 lb (49 kg)   BMI 22.19 kg/m    GEN: Well  nourished, well developed, in no acute distress  HEENT: normal  Neck: no JVD, carotid bruits, or masses Cardiac: RRR; 1/6 systolic murmurs, rubs, or gallops,no edema  Respiratory:  clear to auscultation bilaterally, normal work of breathing GI: soft, nontender, nondistended, + BS MS: no deformity or atrophy  Skin: warm and dry, no rash Neuro:  Alert and Oriented x 3, Strength and sensation are intact Psych: euthymic mood, full affect  Wt Readings from Last 3 Encounters:  11/16/17 108 lb (49 kg)  09/28/17 100 lb (45.4 kg)  07/13/17 100 lb (45.4 kg)    Studies/Labs Reviewed:   EKG:  EKG demonstrates NSR, low voltage ECG  Recent Labs: 03/12/2017: ALT 19; BUN 7; Creatinine, Ser 0.77; Hemoglobin 13.4; Platelets  353; Potassium 3.6; Sodium 141 08/29/2017: TSH 4.13   Lipid Panel    Component Value Date/Time   CHOL 173 11/04/2015 1046   TRIG 95 11/04/2015 1046   HDL 77 11/04/2015 1046   CHOLHDL 2.2 11/04/2015 1046   LDLCALC 78 11/04/2015 1046   TTE: 11/2015 Left ventricle:  The cavity size was normal. Systolic function was normal. The estimated ejection fraction was in the range of 55% to 60%. Wall motion was normal; there were no regional wall motion abnormalities. The transmitral flow pattern was normal. The deceleration time of the early transmitral flow velocity was normal. The pulmonary vein flow pattern was normal. The tissue Doppler parameters were normal. Left ventricular diastolic function parameters were normal. ------------------------------------------------------------------- Aortic valve:   Trileaflet; normal thickness leaflets. Mobility was not restricted.  Doppler:  Transvalvular velocity was within the normal range. There was no stenosis. There was no regurgitation. ------------------------------------------------------------------- Aorta:  Aortic root: The aortic root was normal in size. ------------------------------------------------------------------- Mitral  valve:   Structurally normal valve.   Mobility was not restricted.  Doppler:  Transvalvular velocity was within the normal range. There was no evidence for stenosis. There was no regurgitation. ------------------------------------------------------------------- Left atrium:  The atrium was normal in size. ------------------------------------------------------------------- Right ventricle:  The cavity size was normal. Wall thickness was normal. Systolic function was normal.  EKG performed today 02/16/2017 shows sinus tachycardia otherwise normal EKG, heart rate is faster than previously  ASSESSMENT:    1. Palpitations     PLAN:  In order of problems listed above:  1. The patient carries a diagnosis of Ehlers-Danlos syndrome, a connective tissue disorder associated with valvular disease - however her echo showed normal LVEF with normal size of the aortic root, no AI, mildly thickened mitral valve with no prolapse and only trivial mitral regurgitation.  She is stable, unchanged minimal murmur, no repeat echocardiogram needed at this point. 2. Palpitation, sinus tachycardia, inappropriate sinus tachycardia on Holter in 2017, she discontinued Toprol-XL, she has minimal palpitations and her baseline heart rate is 79.  We will switch Toprol to as needed only. 3. Hyperlipidemia - h/o and also in her father, father had MI in his 51', we will check and treat appropriately.Her lipids were all at goal in 2017.  Her CRP is elevated at 5, I will start red yeast rice 600 mg daily.  Follow-up in 6 months.  Medication Adjustments/Labs and Tests Ordered: Current medicines are reviewed at length with the patient today.  Concerns regarding medicines are outlined above.  Medication changes, Labs and Tests ordered today are listed in the Patient Instructions below. Patient Instructions  Medication Instructions:  1) Take Metoprolol Succinate 25mg  once daily as needed for palpitations.  If you need a refill  on your cardiac medications before your next appointment, please call your pharmacy.   Lab work: None If you have labs (blood work) drawn today and your tests are completely normal, you will receive your results only by: Marland Kitchen MyChart Message (if you have MyChart) OR . A paper copy in the mail If you have any lab test that is abnormal or we need to change your treatment, we will call you to review the results.  Testing/Procedures: None  Follow-Up: At [facility], you and your health needs are our priority.  As part of our continuing mission to provide you with exceptional heart care, we have created designated Provider Care Teams.  These Care Teams include your primary Cardiologist (physician) and Advanced Practice Providers (APPs -  Physician Assistants and Nurse Practitioners) who all work together to provide you with the care you need, when you need it. You will need a follow up appointment in 6 months.  Please call our office 2 months in advance to schedule this appointment.  You may see Ena Dawley, MD or one of the following Advanced Practice Providers on your designated Care Team:   [place], PA-C Melina Copa, PA-C . Ermalinda Barrios, PA-C  Any Other Special Instructions Will Be Listed Below (If Applicable).       Signed, Ena Dawley, MD  11/16/2017 2:38 PM    [address], [place], [place]  [address] Phone: [phone]; Fax: [phone]

## 2017-11-16 NOTE — Patient Instructions (Signed)
Medication Instructions:  1) Take Metoprolol Succinate 25mg  once daily as needed for palpitations.  If you need a refill on your cardiac medications before your next appointment, please call your pharmacy.   Lab work: None If you have labs (blood work) drawn today and your tests are completely normal, you will receive your results only by: Marland Kitchen MyChart Message (if you have MyChart) OR . A paper copy in the mail If you have any lab test that is abnormal or we need to change your treatment, we will call you to review the results.  Testing/Procedures: None  Follow-Up: At [facility], you and your health needs are our priority.  As part of our continuing mission to provide you with exceptional heart care, we have created designated Provider Care Teams.  These Care Teams include your primary Cardiologist (physician) and Advanced Practice Providers (APPs -  Physician Assistants and Nurse Practitioners) who all work together to provide you with the care you need, when you need it. You will need a follow up appointment in 6 months.  Please call our office 2 months in advance to schedule this appointment.  You may see Ena Dawley, MD or one of the following Advanced Practice Providers on your designated Care Team:   [place], PA-C Melina Copa, PA-C . Ermalinda Barrios, PA-C  Any Other Special Instructions Will Be Listed Below (If Applicable).

## 2017-12-05 ENCOUNTER — Encounter: Payer: Self-pay | Admitting: Sports Medicine

## 2017-12-05 ENCOUNTER — Ambulatory Visit (INDEPENDENT_AMBULATORY_CARE_PROVIDER_SITE_OTHER): Payer: 59 | Admitting: Sports Medicine

## 2017-12-05 DIAGNOSIS — M5010 Cervical disc disorder with radiculopathy, unspecified cervical region: Secondary | ICD-10-CM

## 2017-12-05 DIAGNOSIS — M533 Sacrococcygeal disorders, not elsewhere classified: Secondary | ICD-10-CM

## 2017-12-05 DIAGNOSIS — G8929 Other chronic pain: Secondary | ICD-10-CM

## 2017-12-05 DIAGNOSIS — D8940 Mast cell activation, unspecified: Secondary | ICD-10-CM

## 2017-12-05 NOTE — Assessment & Plan Note (Signed)
I do not think she has another disc Suspect facet joint subluxation Given simple neck position, self traction and isometrics activities  Will follow  Reck 3 mos

## 2017-12-05 NOTE — Patient Instructions (Signed)
Neck Probable facet subluxation at C4  Gentle neck rotation self traction Isometrics  SI joint Gentle hip rotation with hip flexion during day Exercises for reverse straight leg raise - home or at pilates Trapezius exercises - unsheathing the sword exercise Pelvic posture position

## 2017-12-05 NOTE — Assessment & Plan Note (Signed)
Restart some hip rotation exercises Pelvic positioning See instructions

## 2017-12-05 NOTE — Progress Notes (Signed)
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 before  Right SI joint has come out off and on over the years Not is painful to lie flat She can sometimes reposition this by a crossover stretch  Review of systems Significant problems with mast cell activation and skin hypersensitivity being treated by a dermatologist Significant reflux which is a new symptom for her  Physical examination Pleasant female in no acute distress BP 129/82   Ht 4\' 10"  (1.473 m)   Wt 100 lb (45.4 kg)   BMI 20.90 kg/m   Neck exam reveals full range of motion She is cautious with lateral left bend or rotation There is some very mild scapular dyskinesis on the left only No radicular signs on examination  Right SI joint is initially tight Pain with FABER and FADIR After manipulation the SI joint is mobile again She is painful directly over the right SI joint

## 2017-12-07 ENCOUNTER — Ambulatory Visit: Payer: 59 | Admitting: Sports Medicine

## 2017-12-21 ENCOUNTER — Ambulatory Visit: Payer: 59 | Admitting: Cardiology

## 2018-01-15 DIAGNOSIS — Z23 Encounter for immunization: Secondary | ICD-10-CM | POA: Diagnosis not present

## 2018-02-02 ENCOUNTER — Ambulatory Visit: Payer: 59 | Admitting: Sports Medicine

## 2018-02-14 DIAGNOSIS — M792 Neuralgia and neuritis, unspecified: Secondary | ICD-10-CM | POA: Diagnosis not present

## 2018-02-14 DIAGNOSIS — L503 Dermatographic urticaria: Secondary | ICD-10-CM | POA: Diagnosis not present

## 2018-02-14 DIAGNOSIS — D485 Neoplasm of uncertain behavior of skin: Secondary | ICD-10-CM | POA: Diagnosis not present

## 2018-02-14 DIAGNOSIS — L309 Dermatitis, unspecified: Secondary | ICD-10-CM | POA: Diagnosis not present

## 2018-02-14 DIAGNOSIS — Z23 Encounter for immunization: Secondary | ICD-10-CM | POA: Diagnosis not present

## 2018-02-28 ENCOUNTER — Other Ambulatory Visit: Payer: Self-pay | Admitting: Sports Medicine

## 2018-02-28 ENCOUNTER — Other Ambulatory Visit: Payer: Self-pay | Admitting: *Deleted

## 2018-02-28 MED ORDER — ZOLPIDEM TARTRATE 10 MG PO TABS: ORAL_TABLET | ORAL | 1 refills | Status: DC

## 2018-03-02 ENCOUNTER — Ambulatory Visit: Payer: Self-pay

## 2018-03-02 ENCOUNTER — Encounter: Payer: Self-pay | Admitting: Sports Medicine

## 2018-03-02 ENCOUNTER — Ambulatory Visit (INDEPENDENT_AMBULATORY_CARE_PROVIDER_SITE_OTHER): Payer: 59 | Admitting: Sports Medicine

## 2018-03-02 VITALS — BP 100/64 | Ht <= 58 in | Wt 100.0 lb

## 2018-03-02 DIAGNOSIS — M25512 Pain in left shoulder: Secondary | ICD-10-CM

## 2018-03-02 DIAGNOSIS — G90A Postural orthostatic tachycardia syndrome (POTS): Secondary | ICD-10-CM

## 2018-03-02 DIAGNOSIS — R000 Tachycardia, unspecified: Secondary | ICD-10-CM

## 2018-03-02 DIAGNOSIS — I951 Orthostatic hypotension: Secondary | ICD-10-CM | POA: Diagnosis not present

## 2018-03-02 DIAGNOSIS — R69 Illness, unspecified: Secondary | ICD-10-CM | POA: Diagnosis not present

## 2018-03-02 DIAGNOSIS — Q796 Ehlers-Danlos syndrome, unspecified: Secondary | ICD-10-CM | POA: Diagnosis not present

## 2018-03-02 DIAGNOSIS — I498 Other specified cardiac arrhythmias: Secondary | ICD-10-CM

## 2018-03-02 DIAGNOSIS — F5104 Psychophysiologic insomnia: Secondary | ICD-10-CM | POA: Diagnosis not present

## 2018-03-02 NOTE — Progress Notes (Signed)
EDS issues  joints RT thumb dislocated palmar 1 mo ago - took 45 mins to pop back in  Rt great toe subluxing dorsal past 3 mos and locks Pops back in spontaneously Left bunionette flared Prednisone and keflex for 10 days after aspiration  Left shoulder and arm past 2 mos with more pain Starts at neck Gets numbness in upper arm Gets a tinels if she puts pressure at her neck  Skin hyperreactivity Lots of red marks and hypersensitive spots Bruising at base of thumb To see Tiajuana Amass, allergy after seeing Dr Jari Pigg in derm  Sleep pattern Takes some Ambien Gets painful joints at night - gets up to get ice 3 hrs straight or less sleep Some forgetfulness  Pilates At least once per week - Wed Morning and some weekends Foot and hand problems have limited gym work  Medication review Reviewed and no changes today pending consults  ROS POTS sxs are not bad now Not using medicines but careful standing, etc Gets rapid HR and slows with sitting  Facial and head fullness occurs with this  PE Pleasant F in NAD BP 100/64   Ht 4\' 10"  (1.473 m)   Wt 100 lb (45.4 kg)   BMI 20.90 kg/m   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 Bruising around MCP and CMC joints on left  + tinel sign with pressure on lateral left neck with radiation down left arm to thumb Neck shows increased ROM No pain with motion  Left shoulder Hypermobile Good RC strength Neg impingement sings TTP on squeeze of deltoid upper arm  Binionettes left and right On left some skin hyperpigmentation  Ultrasound of Left Shoulder Biceps tendon short and long is normal Subscapular tendon normal - with motion gets some impingement under coracoid Suprspinatus shows some calcification but no acute findings Infraspinatus and Teres minor normal Deltoid, biceps and triceps scanned with no muscle disruption  Impression: unremarkable left shoulder ultrasound  Ultrasound and interpretation by Wolfgang Phoenix. Oneida Alar,  MD

## 2018-03-02 NOTE — Assessment & Plan Note (Signed)
Multiple issues today  Will work on neck and shoulder  Evaluation for mast cell activation

## 2018-03-02 NOTE — Assessment & Plan Note (Signed)
Much of this is triggered by joint pain Will keep up some tramadol  May make changes after current skin evals  Some prn Lorrin Mais

## 2018-03-02 NOTE — Assessment & Plan Note (Signed)
Reasonable control with posture and activity modification

## 2018-03-02 NOTE — Patient Instructions (Signed)
Neck is probably key to arm pain  Consider Use soft collar for 30 mins. 2 or 3 times per day Do some vertical traction for your neck manually  Add Vit. B6 50 or 100 mgm per day  Let's not change meds until your allergy evaluation  I'll keep seeing you about every 3 months or as needed  Pilates is still helpful  No muscle tear in shouder or arm so good motion for this may lessen symtoms triggered by nerve

## 2018-03-08 DIAGNOSIS — L503 Dermatographic urticaria: Secondary | ICD-10-CM | POA: Diagnosis not present

## 2018-03-08 DIAGNOSIS — R05 Cough: Secondary | ICD-10-CM | POA: Diagnosis not present

## 2018-03-08 DIAGNOSIS — J3089 Other allergic rhinitis: Secondary | ICD-10-CM | POA: Diagnosis not present

## 2018-03-08 DIAGNOSIS — J3081 Allergic rhinitis due to animal (cat) (dog) hair and dander: Secondary | ICD-10-CM | POA: Diagnosis not present

## 2018-03-20 DIAGNOSIS — Q796 Ehlers-Danlos syndrome, unspecified: Secondary | ICD-10-CM | POA: Diagnosis not present

## 2018-03-20 DIAGNOSIS — Z0000 Encounter for general adult medical examination without abnormal findings: Secondary | ICD-10-CM | POA: Diagnosis not present

## 2018-03-20 DIAGNOSIS — G4700 Insomnia, unspecified: Secondary | ICD-10-CM | POA: Diagnosis not present

## 2018-03-20 DIAGNOSIS — M5090 Cervical disc disorder, unspecified, unspecified cervical region: Secondary | ICD-10-CM | POA: Diagnosis not present

## 2018-03-20 DIAGNOSIS — D126 Benign neoplasm of colon, unspecified: Secondary | ICD-10-CM | POA: Diagnosis not present

## 2018-03-20 DIAGNOSIS — R000 Tachycardia, unspecified: Secondary | ICD-10-CM | POA: Diagnosis not present

## 2018-03-21 DIAGNOSIS — L509 Urticaria, unspecified: Secondary | ICD-10-CM | POA: Diagnosis not present

## 2018-03-27 DIAGNOSIS — D259 Leiomyoma of uterus, unspecified: Secondary | ICD-10-CM | POA: Diagnosis not present

## 2018-03-27 DIAGNOSIS — Z6821 Body mass index (BMI) 21.0-21.9, adult: Secondary | ICD-10-CM | POA: Diagnosis not present

## 2018-03-27 DIAGNOSIS — Z01419 Encounter for gynecological examination (general) (routine) without abnormal findings: Secondary | ICD-10-CM | POA: Diagnosis not present

## 2018-03-27 DIAGNOSIS — N852 Hypertrophy of uterus: Secondary | ICD-10-CM | POA: Diagnosis not present

## 2018-04-11 ENCOUNTER — Other Ambulatory Visit (INDEPENDENT_AMBULATORY_CARE_PROVIDER_SITE_OTHER): Payer: Self-pay | Admitting: Family Medicine

## 2018-04-18 DIAGNOSIS — L501 Idiopathic urticaria: Secondary | ICD-10-CM | POA: Diagnosis not present

## 2018-04-23 DIAGNOSIS — R000 Tachycardia, unspecified: Secondary | ICD-10-CM | POA: Diagnosis not present

## 2018-05-16 DIAGNOSIS — L501 Idiopathic urticaria: Secondary | ICD-10-CM | POA: Diagnosis not present

## 2018-05-24 ENCOUNTER — Telehealth: Payer: Self-pay | Admitting: *Deleted

## 2018-05-24 NOTE — Telephone Encounter (Signed)
..   Virtual Visit Pre-Appointment Phone Call  "(Name), I am calling you today to discuss your upcoming appointment. We are currently trying to limit exposure to the virus that causes COVID-19 by seeing patients at home rather than in the office."  1. "What is the BEST phone number to call the day of the visit?" - include this in appointment notes  2. Do you have or have access to (through a family member/friend) a smartphone with video capability that we can use for your visit?" a. If yes - list this number in appt notes as cell (if different from BEST phone #) and list the appointment type as a VIDEO visit in appointment notes b. If no - list the appointment type as a PHONE visit in appointment notes  3. Confirm consent - "In the setting of the current Covid19 crisis, you are scheduled for a (phone or video) visit with your provider on (date) at (time).  Just as we do with many in-office visits, in order for you to participate in this visit, we must obtain consent.  If you'd like, I can send this to your mychart (if signed up) or email for you to review.  Otherwise, I can obtain your verbal consent now.  All virtual visits are billed to your insurance company just like a normal visit would be.  By agreeing to a virtual visit, we'd like you to understand that the technology does not allow for your provider to perform an examination, and thus may limit your provider's ability to fully assess your condition. If your provider identifies any concerns that need to be evaluated in person, we will make arrangements to do so.  Finally, though the technology is pretty good, we cannot assure that it will always work on either your or our end, and in the setting of a video visit, we may have to convert it to a phone-only visit.  In either situation, we cannot ensure that we have a secure connection.  Are you willing to proceed?" STAFF: Did the patient verbally acknowledge consent to telehealth visit? Document  YES/NO here: YES  4. Advise patient to be prepared - "Two hours prior to your appointment, go ahead and check your blood pressure, pulse, oxygen saturation, and your weight (if you have the equipment to check those) and write them all down. When your visit starts, your provider will ask you for this information. If you have an Apple Watch or Kardia device, please plan to have heart rate information ready on the day of your appointment. Please have a pen and paper handy nearby the day of the visit as well." YES  5. Give patient instructions for MyChart download to smartphone OR Doximity/Doxy.me as below if video visit (depending on what platform provider is using)YES  6. Inform patient they will receive a phone call 15 minutes prior to their appointment time (may be from unknown caller ID) so they should be prepared to answer [place] has been deemed a candidate for a follow-up tele-health visit to limit community exposure during the Covid-19 pandemic. I spoke with the patient via phone to ensure availability of phone/video source, confirm preferred email & phone number, and discuss instructions and expectations.  I reminded Kynadi S Baity to be prepared with any vital sign and/or heart rhythm information that could potentially be obtained via home monitoring, at the time of her visit. I reminded Lowella S Pelot to expect a phone  call prior to her visit.  Bevelyn Buckles L, CMA 05/24/2018 2:26 PM    IF USING DOXIMITY or DOXY.ME - The patient will receive a link just prior to their visit by text. YES PATIENT AWARE     FULL LENGTH CONSENT FOR TELE-HEALTH VISIT   I hereby voluntarily request, consent and authorize [facility] and its employed or contracted physicians, Engineer, materials, nurse practitioners or other licensed health care professionals (the Practitioner), to provide me with telemedicine health care services (the Services") as deemed necessary by the  treating Practitioner. I acknowledge and consent to receive the Services by the Practitioner via telemedicine. I understand that the telemedicine visit will involve communicating with the Practitioner through live audiovisual communication technology and the disclosure of certain medical information by electronic transmission. I acknowledge that I have been given the opportunity to request an in-person assessment or other available alternative prior to the telemedicine visit and am voluntarily participating in the telemedicine visit.  I understand that I have the right to withhold or withdraw my consent to the use of telemedicine in the course of my care at any time, without affecting my right to future care or treatment, and that the Practitioner or I may terminate the telemedicine visit at any time. I understand that I have the right to inspect all information obtained and/or recorded in the course of the telemedicine visit and may receive copies of available information for a reasonable fee.  I understand that some of the potential risks of receiving the Services via telemedicine include:   Delay or interruption in medical evaluation due to technological equipment failure or disruption;  Information transmitted may not be sufficient (e.g. poor resolution of images) to allow for appropriate medical decision making by the Practitioner; and/or   In rare instances, security protocols could fail, causing a breach of personal health information.  Furthermore, I acknowledge that it is my responsibility to provide information about my medical history, conditions and care that is complete and accurate to the best of my ability. I acknowledge that Practitioner's advice, recommendations, and/or decision may be based on factors not within their control, such as incomplete or inaccurate data provided by me or distortions of diagnostic images or specimens that may result from electronic transmissions. I understand  that the practice of medicine is not an exact science and that Practitioner makes no warranties or guarantees regarding treatment outcomes. I acknowledge that I will receive a copy of this consent concurrently upon execution via email to the email address I last provided but may also request a printed copy by calling the office of [facility].    I understand that my insurance will be billed for this visit.   I have read or had this consent read to me.  I understand the contents of this consent, which adequately explains the benefits and risks of the Services being provided via telemedicine.   I have been provided ample opportunity to ask questions regarding this consent and the Services and have had my questions answered to my satisfaction.  I give my informed consent for the services to be provided through the use of telemedicine in my medical care  By participating in this telemedicine visit I agree to the above. Patient is aware that consent to treat will be sent to her active mychart for her to review prior to her 5/27 appointment

## 2018-06-07 ENCOUNTER — Telehealth: Payer: Self-pay | Admitting: Cardiology

## 2018-06-07 NOTE — Telephone Encounter (Signed)
This request was forwarded to Dr Francesca Oman scheduler to call the pt back and arrange OV appt at Dr Francesca Oman next available appt.

## 2018-06-07 NOTE — Telephone Encounter (Signed)
per pt call time conflict r/s wants an in office visit some time in August.  Please give her a call back to schedule this.

## 2018-06-07 NOTE — Telephone Encounter (Signed)
RE: MOVE TO REGULAR OV  Received: Today  Message Contents  Lilli Few, LPN        I called patient and rescheduled her appt for 9/22 and I placed her on the wait list.

## 2018-06-13 ENCOUNTER — Telehealth: Payer: 59 | Admitting: Cardiology

## 2018-06-13 DIAGNOSIS — L501 Idiopathic urticaria: Secondary | ICD-10-CM | POA: Diagnosis not present

## 2018-07-03 MED ORDER — PREDNISONE 20 MG PO TABS: 20.0000 mg | ORAL_TABLET | Freq: Two times a day (BID) | ORAL | 0 refills | Status: DC

## 2018-07-05 ENCOUNTER — Ambulatory Visit (INDEPENDENT_AMBULATORY_CARE_PROVIDER_SITE_OTHER): Payer: 59 | Admitting: Sports Medicine

## 2018-07-05 ENCOUNTER — Encounter

## 2018-07-05 ENCOUNTER — Other Ambulatory Visit: Payer: Self-pay

## 2018-07-05 ENCOUNTER — Encounter: Payer: Self-pay | Admitting: Sports Medicine

## 2018-07-05 DIAGNOSIS — M5010 Cervical disc disorder with radiculopathy, unspecified cervical region: Secondary | ICD-10-CM | POA: Diagnosis not present

## 2018-07-05 MED ORDER — TRAMADOL HCL 50 MG PO TABS: ORAL_TABLET | ORAL | 3 refills | Status: DC

## 2018-07-05 NOTE — Assessment & Plan Note (Signed)
Symptoms suggest new disc herniation # days of prednisone 20 bid have helped some Tramadol helps pain Xanaflex +/_  Increase tramadol to Q4h More rest for neck Finish prednisone and may repeat if for 1 week Recheck in 2 weeks Not a good surgical candidate but if worse would consider consult

## 2018-07-05 NOTE — Patient Instructions (Signed)
You likely have a ruptured disc.  Take tramadol every 4 hours to control pain.  Finish the prednisone.  If not 75% better call and we will do a second week.  Rest as much as possible. No more than 4 hours work daily.  Do not lift anything greater than 10 pounds,  Recheck 2 weeks

## 2018-07-05 NOTE — Progress Notes (Signed)
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 Prior cervical disc Sharp pain started in neck 1 week ago Radiating into the left arm Better with lying down Worse w arm hanging down or standing Very tender trigger point No specific injury  Seen with neck pain by Dr Junius Roads 08/2017 XR then showed fusion looked solid on plain XR Did get some relief with Xanaflex  ROS No weakness in arm No weakness or difficulty in using left hand No real numbness  PE Patient appears in some pain but NAD BP 132/82   Ht 4\' 10"  (1.473 m)   Wt 100 lb (45.4 kg)   BMI 20.90 kg/m    TTP at lower left neck Mild trapezius spasm Has good strength in testing left arm No sensory loss to palpation Reflexes are 3+ biceps and triceps Movement or extension of neck triggers pain down left arm

## 2018-07-09 ENCOUNTER — Other Ambulatory Visit: Payer: Self-pay | Admitting: *Deleted

## 2018-07-09 MED ORDER — ZOLPIDEM TARTRATE 10 MG PO TABS: ORAL_TABLET | ORAL | 1 refills | Status: DC

## 2018-07-09 MED ORDER — PREDNISONE 20 MG PO TABS: 20.0000 mg | ORAL_TABLET | Freq: Two times a day (BID) | ORAL | 0 refills | Status: DC

## 2018-07-10 ENCOUNTER — Telehealth: Payer: Self-pay | Admitting: *Deleted

## 2018-07-10 NOTE — Telephone Encounter (Signed)
    COVID-19 Pre-Screening Questions:  . In the past 7 to 10 days have you had a cough,  shortness of breath, headache, congestion, fever (100 or greater) body aches, chills, sore throat, or sudden loss of taste or sense of smell?-NO . Have you been around anyone with known Covid 19.-NO . Have you been around anyone who is awaiting Covid 19 test results in the past 7 to 10 days?-NO . Have you been around anyone who has been exposed to Covid 19, or has mentioned symptoms of Covid 19 within the past 7 to 10 days?-NO  Pt covid pre-screen questions were done and answered "no" to all.  Pt is aware of no visitor policy and to wear her face mask at all times to this appt.  Pt verbalized understanding and agrees with this plan.

## 2018-07-11 ENCOUNTER — Other Ambulatory Visit: Payer: Self-pay

## 2018-07-11 ENCOUNTER — Encounter: Payer: Self-pay | Admitting: Cardiology

## 2018-07-11 ENCOUNTER — Ambulatory Visit (INDEPENDENT_AMBULATORY_CARE_PROVIDER_SITE_OTHER): Payer: 59 | Admitting: Cardiology

## 2018-07-11 VITALS — BP 128/78 | HR 94 | Ht 58.5 in | Wt 104.8 lb

## 2018-07-11 DIAGNOSIS — Q7962 Hypermobile Ehlers-Danlos syndrome: Secondary | ICD-10-CM

## 2018-07-11 DIAGNOSIS — R002 Palpitations: Secondary | ICD-10-CM | POA: Diagnosis not present

## 2018-07-11 DIAGNOSIS — L501 Idiopathic urticaria: Secondary | ICD-10-CM | POA: Diagnosis not present

## 2018-07-11 DIAGNOSIS — R000 Tachycardia, unspecified: Secondary | ICD-10-CM

## 2018-07-11 NOTE — Patient Instructions (Addendum)
Medication Instructions:  Your physician recommends that you continue on your current medications as directed. Please refer to the Current Medication list given to you today.  If you need a refill on your cardiac medications before your next appointment, please call your pharmacy.   Lab work: None ordered  If you have labs (blood work) drawn today and your tests are completely normal, you will receive your results only by: Marland Kitchen MyChart Message (if you have MyChart) OR . A paper copy in the mail If you have any lab test that is abnormal or we need to change your treatment, we will call you to review the results.  Testing/Procedures: Your physician has requested that you have an echocardiogram. Echocardiography is a painless test that uses sound waves to create images of your heart. It provides your doctor with information about the size and shape of your heart and how well your heart's chambers and valves are working. This procedure takes approximately one hour. There are no restrictions for this procedure.   Follow-Up: At [facility], you and your health needs are our priority.  As part of our continuing mission to provide you with exceptional heart care, we have created designated Provider Care Teams.  These Care Teams include your primary Cardiologist (physician) and Advanced Practice Providers (APPs -  Physician Assistants and Nurse Practitioners) who all work together to provide you with the care you need, when you need it. You will need a follow up appointment in 6 months.  Please call our office 2 months in advance to schedule this appointment.  You may see Ena Dawley, MD or one of the following Advanced Practice Providers on your designated Care Team:   [place], PA-C Melina Copa, PA-C . Ermalinda Barrios, PA-C  Any Other Special Instructions Will Be Listed Below (If Applicable).   Echocardiogram An echocardiogram is a procedure that uses painless sound waves (ultrasound)  to produce an image of the heart. Images from an echocardiogram can provide important information about:  Signs of coronary artery disease (CAD).  Aneurysm detection. An aneurysm is a weak or damaged part of an artery wall that bulges out from the normal force of blood pumping through the body.  Heart size and shape. Changes in the size or shape of the heart can be associated with certain conditions, including heart failure, aneurysm, and CAD.  Heart muscle function.  Heart valve function.  Signs of a past heart attack.  Fluid buildup around the heart.  Thickening of the heart muscle.  A tumor or infectious growth around the heart valves. Tell a health care provider about:  Any allergies you have.  All medicines you are taking, including vitamins, herbs, eye drops, creams, and over-the-counter medicines.  Any blood disorders you have.  Any surgeries you have had.  Any medical conditions you have.  Whether you are pregnant or may be pregnant. What are the risks? Generally, this is a safe procedure. However, problems may occur, including:  Allergic reaction to dye (contrast) that may be used during the procedure. What happens before the procedure? No specific preparation is needed. You may eat and drink normally. What happens during the procedure?   An IV tube may be inserted into one of your veins.  You may receive contrast through this tube. A contrast is an injection that improves the quality of the pictures from your heart.  A gel will be applied to your chest.  A wand-like tool (transducer) will be moved over your chest. The  gel will help to transmit the sound waves from the transducer.  The sound waves will harmlessly bounce off of your heart to allow the heart images to be captured in real-time motion. The images will be recorded on a computer. The procedure may vary among health care providers and hospitals. What happens after the procedure?  You may return  to your normal, everyday life, including diet, activities, and medicines, unless your health care provider tells you not to do that. Summary  An echocardiogram is a procedure that uses painless sound waves (ultrasound) to produce an image of the heart.  Images from an echocardiogram can provide important information about the size and shape of your heart, heart muscle function, heart valve function, and fluid buildup around your heart.  You do not need to do anything to prepare before this procedure. You may eat and drink normally.  After the echocardiogram is completed, you may return to your normal, everyday life, unless your health care provider tells you not to do that. This information is not intended to replace advice given to you by your health care provider. Make sure you discuss any questions you have with your health care provider. Document Released: 01/01/2000 Document Revised: 02/06/2016 Document Reviewed: 02/06/2016 Elsevier Interactive Patient Education  2019 [employer].

## 2018-07-11 NOTE — Progress Notes (Signed)
Cardiology Office Note    Date:  07/11/2018   ID:  [name], DOB 11/16/1965, MRN [id number]  PCP:  Kelton Pillar, MD  Cardiologist:  Ena Dawley, MD   Chief complain: Establish cardiology care with dg of Ehlers-Danlos syndrome  History of Present Illness:  Marissa [name] is a 53 y.o. 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  The patient has a diagnosis of Ehlers-Danlos syndrome, she has tremendous orthopedic problems followed by Dr Stefanie Libel.  She has been experiencing palpitations and states that her heart rate is always high despite good hydration. She is not active in sports as she is limited with orthopedic issues, but walks most days without SOB or chest pain. No syncope.  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 She had an echocardiogram approximately 10 years ago and was told it was normal.  She has never smoked, doesn't have any children.   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 She has been going through physical therapy and using a lot of pain medication control. As a result she was experiencing dizziness and couldn't tolerate Toprol-XL that she stopped taking in January 2018. She has been experiencing a lot of palpitations and has been tachycardic almost all the time.On one occasion while in the pilate class she experienced dyspnea on exertion.  11/16/2017, the patient has developed erythema multiforme in February 2019 with severe presentation and blistering, extensive evaluation was performed including rheumatologic panel biopsies for vasculitis and the only abnormal lab was elevated CRP at 8, repeated CRP in August was  5.  The presentation was blamed on pain medication that was discontinued, she was treated with steroids, now completely resolved.  She is dealing with a lot of problems related to Ehlers-Danlos syndrome, including hip subluxation, neck pain secondary to dislocated disc, bilateral shoulder pain and is very limited to exercises but still continues to do frequent Pilates.  She denies any chest pain shortness of breath dizziness or syncope.  She has occasional palpitations, she does not take metoprolol as there was concern that this might be causing her presentation as well.  07/10/2018, the patient is coming after 6 months she is doing well from cardiac standpoint denies any chest pain shortness of breath no palpitations.  No lower extremity edema.  She is dealing with significant neck pain secondary to disc protrusion in her cervical spine related to underlying diagnosis of Ehlers-Danlos syndrome.  Past Medical History:  Diagnosis Date  . Ehlers-Danlos syndrome   . History of pituitary tumor   . Ocular migraine    Past Surgical History:  Procedure Laterality Date  . KNEE SURGERY N/A 2012  . SHOULDER SURGERY N/A 2009  . UTERINE FIBROID SURGERY N/A    Current Medications: Outpatient Medications Prior to Visit  Medication Sig Dispense Refill  . Fexofenadine HCl (ALLEGRA ALLERGY PO) Allegra Allergy    . levocetirizine (XYZAL) 5 MG tablet Take 5 mg by mouth every evening.    . metoprolol succinate (TOPROL-XL) 25 MG 24 hr tablet Take 1 tablet (25 mg total) by mouth daily as needed. 90 tablet 3  . Multiple Vitamin (MULTIVITAMIN) capsule Take 1  capsule by mouth daily.    . norgestrel-ethinyl estradiol (CRYSELLE-28) 0.3-30 MG-MCG tablet Cryselle (28) 0.3 mg-30 mcg tablet  TAKE 1 TABLET BY MOUTH EVERY DAY CONTINUOUSLY    . predniSONE (DELTASONE) 20 MG tablet Take 1 tablet (20 mg total) by mouth 2 (two) times daily. 14 tablet 0  . tiZANidine (ZANAFLEX) 2 MG tablet TAKE 1-2 TABLETS BY MOUTH AT BEDTIME AS  NEEDED FOR MUSCLE SPAMS 60 tablet 6  . traMADol (ULTRAM) 50 MG tablet 1 tab 4 times daily 120 tablet 3  . XOLAIR 150 MG/ML prefilled syringe     . zolpidem (AMBIEN) 10 MG tablet TAKE 1/2 TO 1 TABLET BY MOUTH AT BEDTIME AS NEEDED 30 tablet 1  . montelukast (SINGULAIR) 10 MG tablet 10 mg.    . norgestrel-ethinyl estradiol (CRYSELLE-28) 0.3-30 MG-MCG tablet Take active pills daily/continuosly, skip placebo pills    . traMADol (ULTRAM) 50 MG tablet tramadol     No facility-administered medications prior to visit.      Allergies:   Macrobid [nitrofurantoin monohyd macro] and Doxepin   Social History   Socioeconomic History  . Marital status: Single    Spouse name: Not on file  . Number of children: 0  . Years of education: Masters  . Highest education level: Not on file  Occupational History    Employer: [employer]  Social Needs  . Financial resource strain: Not on file  . Food insecurity    Worry: Not on file    Inability: Not on file  . Transportation needs    Medical: Not on file    Non-medical: Not on file  Tobacco Use  . Smoking status: Never Smoker  . Smokeless tobacco: Never Used  Substance and Sexual Activity  . Alcohol use: No  . Drug use: No  . Sexual activity: Yes    Birth control/protection: Pill  Lifestyle  . Physical activity    Days per week: Not on file    Minutes per session: Not on file  . Stress: Not on file  Relationships  . Social Herbalist on phone: Not on file    Gets together: Not on file    Attends religious service: Not on file    Active member of club or organization: Not on file    Attends meetings of clubs or organizations: Not on file    Relationship status: Not on file  Other Topics Concern  . Not on file  Social History Narrative  . Not on file    Family History:  The patient's family history includes Arthritis in her father and mother; Atrial fibrillation in her mother; Diabetes in her father; Heart disease in  her father; Heart failure in her father; Hypertension in her father.   ROS:   Please see the history of present illness.    ROS All other systems reviewed and are negative.  PHYSICAL EXAM:   VS:  BP 128/78   Pulse 94   Ht 4' 10.5" (1.486 m)   Wt 104 lb 12.8 oz (47.5 kg)   SpO2 97%   BMI 21.53 kg/m    GEN: Well nourished, well developed, in no acute distress  HEENT: normal  Neck: no JVD, carotid bruits, or masses Cardiac: RRR; 1/6 systolic murmurs, rubs, or gallops,no edema  Respiratory:  clear to auscultation bilaterally, normal work of breathing GI: soft, nontender, nondistended, + BS MS: no deformity or atrophy  Skin: warm and dry, no rash Neuro:  Alert and  Oriented x 3, Strength and sensation are intact Psych: euthymic mood, full affect  Wt Readings from Last 3 Encounters:  07/11/18 104 lb 12.8 oz (47.5 kg)  07/05/18 100 lb (45.4 kg)  03/02/18 100 lb (45.4 kg)    Studies/Labs Reviewed:   EKG:  EKG demonstrates NSR, EKG, unchanged from prior, personally reviewed.  Recent Labs: 08/29/2017: TSH 4.13   Lipid Panel    Component Value Date/Time   CHOL 173 11/04/2015 1046   TRIG 95 11/04/2015 1046   HDL 77 11/04/2015 1046   CHOLHDL 2.2 11/04/2015 1046   LDLCALC 78 11/04/2015 1046   TTE: 11/2015 Left ventricle:  The cavity size was normal. Systolic function was normal. The estimated ejection fraction was in the range of 55% to 60%. Wall motion was normal; there were no regional wall motion abnormalities. The transmitral flow pattern was normal. The deceleration time of the early transmitral flow velocity was normal. The pulmonary vein flow pattern was normal. The tissue Doppler parameters were normal. Left ventricular diastolic function parameters were normal. ------------------------------------------------------------------- Aortic valve:   Trileaflet; normal thickness leaflets. Mobility was not restricted.  Doppler:  Transvalvular velocity was within the normal  range. There was no stenosis. There was no regurgitation. ------------------------------------------------------------------- Aorta:  Aortic root: The aortic root was normal in size. ------------------------------------------------------------------- Mitral valve:   Structurally normal valve.   Mobility was not restricted.  Doppler:  Transvalvular velocity was within the normal range. There was no evidence for stenosis. There was no regurgitation. ------------------------------------------------------------------- Left atrium:  The atrium was normal in size. ------------------------------------------------------------------- Right ventricle:  The cavity size was normal. Wall thickness was normal. Systolic function was normal.  EKG performed today 02/16/2017 shows sinus tachycardia otherwise normal EKG, heart rate is faster than previously  ASSESSMENT:    1. Palpitations   2. Inappropriate sinus tachycardia   3. Ehlers-Danlos syndrome, type 3     PLAN:  In order of problems listed above:  1. The patient carries a diagnosis of Ehlers-Danlos syndrome, a connective tissue disorder associated with valvular disease - however her echo showed normal LVEF with normal size of the aortic root, no AI, mildly thickened mitral valve with no prolapse and only trivial mitral regurgitation.  We will repeat echocardiogram as it has been 3 years since the last one 2. Palpitation, sinus tachycardia, inappropriate sinus tachycardia on Holter in 2017, she continues to take low-dose Toprol-XL and is asymptomatic. 3. Hyperlipidemia - h/o and also in her father, father had MI in his 21', we will check and treat appropriately.Her lipids were all at goal in 2017.  Her CRP is elevated at 5, I will start red yeast rice 600 mg daily.  Follow-up in 1 year.  Medication Adjustments/Labs and Tests Ordered: Current medicines are reviewed at length with the patient today.  Concerns regarding medicines are outlined  above.  Medication changes, Labs and Tests ordered today are listed in the Patient Instructions below. Patient Instructions  Medication Instructions:  Your physician recommends that you continue on your current medications as directed. Please refer to the Current Medication list given to you today.  If you need a refill on your cardiac medications before your next appointment, please call your pharmacy.   Lab work: None ordered  If you have labs (blood work) drawn today and your tests are completely normal, you will receive your results only by: Marland Kitchen MyChart Message (if you have MyChart) OR . A paper copy in the mail If you have any lab test that  is abnormal or we need to change your treatment, we will call you to review the results.  Testing/Procedures: Your physician has requested that you have an echocardiogram. Echocardiography is a painless test that uses sound waves to create images of your heart. It provides your doctor with information about the size and shape of your heart and how well your heart's chambers and valves are working. This procedure takes approximately one hour. There are no restrictions for this procedure.   Follow-Up: At [facility], you and your health needs are our priority.  As part of our continuing mission to provide you with exceptional heart care, we have created designated Provider Care Teams.  These Care Teams include your primary Cardiologist (physician) and Advanced Practice Providers (APPs -  Physician Assistants and Nurse Practitioners) who all work together to provide you with the care you need, when you need it. You will need a follow up appointment in 6 months.  Please call our office 2 months in advance to schedule this appointment.  You may see Ena Dawley, MD or one of the following Advanced Practice Providers on your designated Care Team:   [place], PA-C Melina Copa, PA-C . Ermalinda Barrios, PA-C  Any Other Special Instructions Will Be  Listed Below (If Applicable).   Echocardiogram An echocardiogram is a procedure that uses painless sound waves (ultrasound) to produce an image of the heart. Images from an echocardiogram can provide important information about:  Signs of coronary artery disease (CAD).  Aneurysm detection. An aneurysm is a weak or damaged part of an artery wall that bulges out from the normal force of blood pumping through the body.  Heart size and shape. Changes in the size or shape of the heart can be associated with certain conditions, including heart failure, aneurysm, and CAD.  Heart muscle function.  Heart valve function.  Signs of a past heart attack.  Fluid buildup around the heart.  Thickening of the heart muscle.  A tumor or infectious growth around the heart valves. Tell a health care provider about:  Any allergies you have.  All medicines you are taking, including vitamins, herbs, eye drops, creams, and over-the-counter medicines.  Any blood disorders you have.  Any surgeries you have had.  Any medical conditions you have.  Whether you are pregnant or may be pregnant. What are the risks? Generally, this is a safe procedure. However, problems may occur, including:  Allergic reaction to dye (contrast) that may be used during the procedure. What happens before the procedure? No specific preparation is needed. You may eat and drink normally. What happens during the procedure?   An IV tube may be inserted into one of your veins.  You may receive contrast through this tube. A contrast is an injection that improves the quality of the pictures from your heart.  A gel will be applied to your chest.  A wand-like tool (transducer) will be moved over your chest. The gel will help to transmit the sound waves from the transducer.  The sound waves will harmlessly bounce off of your heart to allow the heart images to be captured in real-time motion. The images will be recorded on a  computer. The procedure may vary among health care providers and hospitals. What happens after the procedure?  You may return to your normal, everyday life, including diet, activities, and medicines, unless your health care provider tells you not to do that. Summary  An echocardiogram is a procedure that uses painless sound waves (  ultrasound) to produce an image of the heart.  Images from an echocardiogram can provide important information about the size and shape of your heart, heart muscle function, heart valve function, and fluid buildup around your heart.  You do not need to do anything to prepare before this procedure. You may eat and drink normally.  After the echocardiogram is completed, you may return to your normal, everyday life, unless your health care provider tells you not to do that. This information is not intended to replace advice given to you by your health care provider. Make sure you discuss any questions you have with your health care provider. Document Released: 01/01/2000 Document Revised: 02/06/2016 Document Reviewed: 02/06/2016 Elsevier Interactive Patient Education  2019 [place], Ena Dawley, MD  07/11/2018 4:17 PM    [address], [place], [place]  [address] Phone: [phone]; Fax: [phone]

## 2018-07-17 ENCOUNTER — Telehealth (HOSPITAL_COMMUNITY): Payer: Self-pay

## 2018-07-17 ENCOUNTER — Encounter: Payer: Self-pay | Admitting: Sports Medicine

## 2018-07-17 ENCOUNTER — Ambulatory Visit (INDEPENDENT_AMBULATORY_CARE_PROVIDER_SITE_OTHER): Payer: 59 | Admitting: Sports Medicine

## 2018-07-17 ENCOUNTER — Other Ambulatory Visit: Payer: Self-pay

## 2018-07-17 DIAGNOSIS — Q796 Ehlers-Danlos syndrome, unspecified: Secondary | ICD-10-CM | POA: Diagnosis not present

## 2018-07-17 DIAGNOSIS — M5010 Cervical disc disorder with radiculopathy, unspecified cervical region: Secondary | ICD-10-CM

## 2018-07-17 DIAGNOSIS — M542 Cervicalgia: Secondary | ICD-10-CM | POA: Diagnosis not present

## 2018-07-17 DIAGNOSIS — M4722 Other spondylosis with radiculopathy, cervical region: Secondary | ICD-10-CM | POA: Diagnosis not present

## 2018-07-17 NOTE — Progress Notes (Signed)
CC; Neck pain with radiculopathy  Patient completed 2 weeks of prednisone This helped take the sharp edge off pain However, overall she has only minimal improvement  Pain is daily up to 8/10 Pain in neck and upper shoulder Movement will shift pain all the way down past the elbow Sharp pain behind scapula  Hx of C5/C6 fusion and after that had persistent symptoms that finally resolved (2016)  With her EDS she has had multiple joint issues  Tramadol takes edge off worst pain for a short period Tizanidine - unclear if this helps but she does try taking some  ROS Tachycardia - this is chronic Tingling into RT cheek and below eye Fatigue Appetite loss Pain relief with lying down and exacerbation with movement  PE WDF who appears in discomfort BP 110/68   Ht 4\' 10"  (1.473 m)   Wt 100 lb (45.4 kg)   BMI 20.90 kg/m   Neck motion creates some pain and she limits this Left trapezius spasm noted Good strength on testing C5 to T1 Reflexes 2+ biceps and triceps Grip strength is good No sensory loss but gets tingling in left UE

## 2018-07-17 NOTE — Assessment & Plan Note (Signed)
I'm worried that her EDS is making her neck more unstable  May wind up needing neck support much of day

## 2018-07-17 NOTE — Assessment & Plan Note (Signed)
I'm worried she has a new disc rupture at level above her fusion C4/5  Pain is severe enough that I believe we need to go ahead with MRI  Options include injection, more conservative care and surgery  Today will fit for a more supportive neck brace than a soft collar to see if this helps  Reck 2 weeks

## 2018-07-17 NOTE — Telephone Encounter (Signed)

## 2018-07-18 ENCOUNTER — Ambulatory Visit (HOSPITAL_COMMUNITY): Payer: 59 | Attending: Cardiovascular Disease

## 2018-07-18 ENCOUNTER — Other Ambulatory Visit: Payer: Self-pay | Admitting: Obstetrics and Gynecology

## 2018-07-18 DIAGNOSIS — Q7962 Hypermobile Ehlers-Danlos syndrome: Secondary | ICD-10-CM | POA: Insufficient documentation

## 2018-07-18 DIAGNOSIS — Z1231 Encounter for screening mammogram for malignant neoplasm of breast: Secondary | ICD-10-CM

## 2018-08-01 ENCOUNTER — Other Ambulatory Visit: Payer: Self-pay

## 2018-08-01 ENCOUNTER — Ambulatory Visit
Admission: RE | Admit: 2018-08-01 | Discharge: 2018-08-01 | Disposition: A | Discharge status: Home / Self Care | Payer: 59 | Source: Ambulatory Visit | Attending: Sports Medicine | Admitting: Sports Medicine

## 2018-08-01 DIAGNOSIS — M542 Cervicalgia: Secondary | ICD-10-CM

## 2018-08-01 DIAGNOSIS — M50223 Other cervical disc displacement at C6-C7 level: Secondary | ICD-10-CM | POA: Diagnosis not present

## 2018-08-01 DIAGNOSIS — M50221 Other cervical disc displacement at C4-C5 level: Secondary | ICD-10-CM | POA: Diagnosis not present

## 2018-08-06 ENCOUNTER — Encounter: Payer: Self-pay | Admitting: *Deleted

## 2018-08-07 ENCOUNTER — Encounter: Payer: Self-pay | Admitting: *Deleted

## 2018-08-08 DIAGNOSIS — L501 Idiopathic urticaria: Secondary | ICD-10-CM | POA: Diagnosis not present

## 2018-08-28 ENCOUNTER — Ambulatory Visit (INDEPENDENT_AMBULATORY_CARE_PROVIDER_SITE_OTHER): Payer: 59 | Admitting: Sports Medicine

## 2018-08-28 ENCOUNTER — Other Ambulatory Visit: Payer: Self-pay

## 2018-08-28 DIAGNOSIS — M5010 Cervical disc disorder with radiculopathy, unspecified cervical region: Secondary | ICD-10-CM | POA: Diagnosis not present

## 2018-08-28 MED ORDER — DULOXETINE HCL 30 MG PO CPEP: 30.0000 mg | ORAL_CAPSULE | Freq: Every day | ORAL | 3 refills | Status: DC

## 2018-08-28 NOTE — Patient Instructions (Signed)
HR Department  For Marissa [name] I would like to continue the restrictions on the FMLA for 4 more weeks.  We are trying new therapy to deal with the current problem.  She still has significant pain and spas,  Ila Mcgill, MD 08/28/18

## 2018-08-28 NOTE — Progress Notes (Signed)
PCP: Kelton Pillar, MD  Subjective:   HPI: Patient is a 53 y.o. female here for radicular nerve pain.  Neck and left arm.  First started about 8 to 9 weeks ago.  MRI 7/15 revealed some disc bulging C4/5 and at C6/7.  She has a fusion (ACDF) at C5/6. Because of persistent pain I put her on 4 hour work restriction (Works at [employer]) with [employer].  Even with 4 hour restriction she has to lie down to get rid of pain.  ONly gets 2 to 3 hours sleep before pain wakens her.  Tramadol and zanaflex help but only for a few hours.  Hard and soft collar have not helped.  Improvement with no facial numbness on RT. Less numbness over left shoulder girdle. Now takes less time for radicular symptoms to stop of she lies down.  Past Medical History:  Diagnosis Date  . Ehlers-Danlos syndrome   . History of pituitary tumor   . Ocular migraine     Current Outpatient Medications on File Prior to Visit  Medication Sig Dispense Refill  . Fexofenadine HCl (ALLEGRA ALLERGY PO) Allegra Allergy    . levocetirizine (XYZAL) 5 MG tablet Take 5 mg by mouth every evening.    . metoprolol succinate (TOPROL-XL) 25 MG 24 hr tablet Take 1 tablet (25 mg total) by mouth daily as needed. 90 tablet 3  . Multiple Vitamin (MULTIVITAMIN) capsule Take 1 capsule by mouth daily.    . norgestrel-ethinyl estradiol (CRYSELLE-28) 0.3-30 MG-MCG tablet Cryselle (28) 0.3 mg-30 mcg tablet  TAKE 1 TABLET BY MOUTH EVERY DAY CONTINUOUSLY    . predniSONE (DELTASONE) 20 MG tablet Take 1 tablet (20 mg total) by mouth 2 (two) times daily. 14 tablet 0  . tiZANidine (ZANAFLEX) 2 MG tablet TAKE 1-2 TABLETS BY MOUTH AT BEDTIME AS NEEDED FOR MUSCLE SPAMS 60 tablet 6  . traMADol (ULTRAM) 50 MG tablet 1 tab 4 times daily 120 tablet 3  . XOLAIR 150 MG/ML prefilled syringe     . zolpidem (AMBIEN) 10 MG tablet TAKE 1/2 TO 1 TABLET BY MOUTH AT BEDTIME AS NEEDED 30 tablet 1   No current facility-administered medications on file prior to visit.     Past  Surgical History:  Procedure Laterality Date  . KNEE SURGERY N/A 2012  . SHOULDER SURGERY N/A 2009  . UTERINE FIBROID SURGERY N/A     Allergies  Allergen Reactions  . Macrobid [employer] Macro]   . Doxepin Rash    Social History   Socioeconomic History  . Marital status: Single    Spouse name: Not on file  . Number of children: 0  . Years of education: Masters  . Highest education level: Not on file  Occupational History    Employer: [employer]  Social Needs  . Financial resource strain: Not on file  . Food insecurity    Worry: Not on file    Inability: Not on file  . Transportation needs    Medical: Not on file    Non-medical: Not on file  Tobacco Use  . Smoking status: Never Smoker  . Smokeless tobacco: Never Used  Substance and Sexual Activity  . Alcohol use: No  . Drug use: No  . Sexual activity: Yes    Birth control/protection: Pill  Lifestyle  . Physical activity    Days per week: Not on file    Minutes per session: Not on file  . Stress: Not on file  Relationships  . Social connections  Talks on phone: Not on file    Gets together: Not on file    Attends religious service: Not on file    Active member of club or organization: Not on file    Attends meetings of clubs or organizations: Not on file    Relationship status: Not on file  . Intimate partner violence    Fear of current or ex partner: Not on file    Emotionally abused: Not on file    Physically abused: Not on file    Forced sexual activity: Not on file  Other Topics Concern  . Not on file  Social History Narrative  . Not on file    Family History  Problem Relation Age of Onset  . Arthritis Mother   . Atrial fibrillation Mother   . Arthritis Father   . Diabetes Father   . Heart disease Father   . Hypertension Father   . Heart failure Father   . Breast cancer Neg Hx     BP 114/70   Review of Systems: Recent flare of hives after lidoderm patch on  neck No weakness in left arm or hand See HPI above.     Objective:  Physical Exam:  Gen: NAD, however, standing and uncomfortable in exam room BP 114/70   Significant trapezius spasm on left Trigger point pain left C6 posterior area ROM of neck is increased above normal and really does not trigger pain Hypersensitive to touch around left shoulder and in left axilla Normal shoulder motion without pain Good arm movement on left without weakness No hand weakness   Assessment & Plan:  1. EDS with cervical pain  See problem list  I observed and examined the patient with the resident and agree with assessment and plan.  Note reviewed and modified by me. I assumed care of this patient secondary to complexity.  Kb Oneida Alar, MD

## 2018-08-28 NOTE — Assessment & Plan Note (Signed)
Because of her EDS I think her neck is relatively unstable Not able to tolerate collars Does get relief from lying down Some transient pain relief from tramadol and zanaflex  We will try something new today to see if anything gives her more relief Cymbalta 30 mg qd  Keep work restriction at 4 hours Easy neck motion every 2 hours Lie down for relief  Reck in 4 weeks

## 2018-08-31 ENCOUNTER — Ambulatory Visit
Admission: RE | Admit: 2018-08-31 | Discharge: 2018-08-31 | Disposition: A | Discharge status: Home / Self Care | Payer: 59 | Source: Ambulatory Visit | Attending: Obstetrics and Gynecology | Admitting: Obstetrics and Gynecology

## 2018-08-31 ENCOUNTER — Other Ambulatory Visit: Payer: Self-pay

## 2018-08-31 DIAGNOSIS — Z1231 Encounter for screening mammogram for malignant neoplasm of breast: Secondary | ICD-10-CM

## 2018-09-05 DIAGNOSIS — L501 Idiopathic urticaria: Secondary | ICD-10-CM | POA: Diagnosis not present

## 2018-09-22 DIAGNOSIS — Z23 Encounter for immunization: Secondary | ICD-10-CM | POA: Diagnosis not present

## 2018-09-25 DIAGNOSIS — Q825 Congenital non-neoplastic nevus: Secondary | ICD-10-CM | POA: Diagnosis not present

## 2018-09-25 DIAGNOSIS — L821 Other seborrheic keratosis: Secondary | ICD-10-CM | POA: Diagnosis not present

## 2018-09-25 DIAGNOSIS — Z86018 Personal history of other benign neoplasm: Secondary | ICD-10-CM | POA: Diagnosis not present

## 2018-09-25 DIAGNOSIS — D225 Melanocytic nevi of trunk: Secondary | ICD-10-CM | POA: Diagnosis not present

## 2018-09-25 DIAGNOSIS — D2271 Melanocytic nevi of right lower limb, including hip: Secondary | ICD-10-CM | POA: Diagnosis not present

## 2018-09-27 ENCOUNTER — Ambulatory Visit (INDEPENDENT_AMBULATORY_CARE_PROVIDER_SITE_OTHER): Payer: 59 | Admitting: Sports Medicine

## 2018-09-27 ENCOUNTER — Other Ambulatory Visit: Payer: Self-pay

## 2018-09-27 DIAGNOSIS — R000 Tachycardia, unspecified: Secondary | ICD-10-CM

## 2018-09-27 DIAGNOSIS — I951 Orthostatic hypotension: Secondary | ICD-10-CM

## 2018-09-27 DIAGNOSIS — G90A Postural orthostatic tachycardia syndrome (POTS): Secondary | ICD-10-CM

## 2018-09-27 DIAGNOSIS — I498 Other specified cardiac arrhythmias: Secondary | ICD-10-CM

## 2018-09-27 DIAGNOSIS — M5010 Cervical disc disorder with radiculopathy, unspecified cervical region: Secondary | ICD-10-CM

## 2018-09-27 MED ORDER — DIAZEPAM 2 MG PO TABS: 2.0000 mg | ORAL_TABLET | Freq: Every day | ORAL | 1 refills | Status: DC

## 2018-09-27 NOTE — Assessment & Plan Note (Signed)
This seems to be flaring somewhat  Try low dose of her metoprolol XR (1/2 tab) Try evening and maybe qod

## 2018-09-27 NOTE — Progress Notes (Signed)
Cervical Disc with radiculopathy  Patient tried a course of cymbalta After 1 week not much pain relief She began getting flushing and also some hives She had a reaction in past to docepin  She stopped cymbalta Over past 2 weeks using more zanaflex That has helped with spasm She generally is some better However after 4 to 5 hours up she has to lie down to block more severe pain Still radiates into left arm, axilla and trap  Has been going to PT/Pilates and the muscle work does help somewhat  ROS Sleep pattern interupted about 2 to 3 hours with pain Now having more tachcardia and monitor shows HR variability with highs in 120 to 130 range Not taking POTS medicine  PE Pleasant, but in some discomfort BP 114/80   Ht 4' 10.5" (1.486 m)   Wt 105 lb (47.6 kg)   BMI 21.57 kg/m   Spasm over Trapezius bilat Pectoralis spasm Tightness with motion of left shoulder No RC weakness but some spasm in mm around left shoulder No weakness  No weakness in hand or forearm

## 2018-09-27 NOTE — Assessment & Plan Note (Signed)
This has persisted Gradual reduction of most severe pain with more rest Still with lots of spasm  Trial on 2 mg diazepam at night for mm relaxant as opposed to xanaflex Cont to do PT  RTW 4 days per week

## 2018-10-03 DIAGNOSIS — L501 Idiopathic urticaria: Secondary | ICD-10-CM | POA: Diagnosis not present

## 2018-10-09 ENCOUNTER — Ambulatory Visit: Payer: 59 | Admitting: Cardiology

## 2018-10-25 ENCOUNTER — Other Ambulatory Visit: Payer: Self-pay

## 2018-10-25 ENCOUNTER — Ambulatory Visit (INDEPENDENT_AMBULATORY_CARE_PROVIDER_SITE_OTHER): Payer: 59 | Admitting: Sports Medicine

## 2018-10-25 DIAGNOSIS — I83893 Varicose veins of bilateral lower extremities with other complications: Secondary | ICD-10-CM | POA: Diagnosis not present

## 2018-10-25 DIAGNOSIS — M5010 Cervical disc disorder with radiculopathy, unspecified cervical region: Secondary | ICD-10-CM

## 2018-10-25 MED ORDER — DIAZEPAM 5 MG PO TABS: ORAL_TABLET | ORAL | 3 refills | Status: DC

## 2018-10-25 NOTE — Progress Notes (Signed)
F/u: neck pain  Definitely improved on diazepam 2 mg at night Spasm seems to break for up to 3 to 4 hours Now sleeps that long and then can get back to sleep for 1 hour Still having to use in combo with ambien  No radiation down arm since starting this  No weakness in left arm noted  ROS New onset of left upper calf pain Some brusing No injury Hurst around to medial knee  PE Pleasant F in NAD BP 102/76   Ht 4' 10.5" (1.486 m)   Wt 105 lb (47.6 kg)   BMI 21.57 kg/m   Neck Increased ROM with no pain today Trapezius without spasm left or right Full motion of both shoulders Neuro testing normal  Left knee TTP upper post calf below popliteal space TTP at pes anserine and just behind this area Full ROM Norm ligament and meniscus testingSlight bruising in post upper calf  [place] of left Calf/ post knee Knee scans shows normal SPP no effusion Norm med. And Lat. meniscii Norm PT and QT  Upper post calf [place] Dilated varicose vein with perforators Has some local swelling Still compressible but low flow on doppler

## 2018-10-25 NOTE — Assessment & Plan Note (Signed)
Radiculopathy resolving Diazepam working well for night time spasm Will increase dose to 5 mg hs and may increase to bid Cont. Pilates  Reck 4 to 6 weeks

## 2018-10-25 NOTE — Assessment & Plan Note (Signed)
Conservative TS ASA one daily Vit C 500 daily Foot and ankle motion exercise Don't compress leg or keep bent for long periods  Follow as needed

## 2018-10-31 DIAGNOSIS — L501 Idiopathic urticaria: Secondary | ICD-10-CM | POA: Diagnosis not present

## 2018-11-27 ENCOUNTER — Other Ambulatory Visit: Payer: Self-pay

## 2018-11-27 ENCOUNTER — Ambulatory Visit (INDEPENDENT_AMBULATORY_CARE_PROVIDER_SITE_OTHER): Payer: 59 | Admitting: Sports Medicine

## 2018-11-27 DIAGNOSIS — I83893 Varicose veins of bilateral lower extremities with other complications: Secondary | ICD-10-CM

## 2018-11-27 DIAGNOSIS — M5010 Cervical disc disorder with radiculopathy, unspecified cervical region: Secondary | ICD-10-CM | POA: Diagnosis not present

## 2018-11-27 NOTE — Assessment & Plan Note (Signed)
This has improved since using diazepam as a muscle relaxant  Cont. With this  Cont pilates  Isometric neck exercise

## 2018-11-27 NOTE — Assessment & Plan Note (Signed)
Still with swelling in soft tissue below knee Will try compression sleeve when walking or standing Cont ASA and Vit. C

## 2018-11-27 NOTE — Progress Notes (Signed)
CC: Neck pain  Significant improvement in neck pain after starting diazepam Never went to day time dose Has stuck with 5 QHS Able to stop ambien  Still only gets 3 hours sleep max before waking up Generally getting more hours of sleep  Left knee pain Last visit VV and pain behind knee Also some pes senderness  Back to pilates 1 day per week Overall MSK status is improved  ROS No giving way or subluxations of knee Still gets radicular sxs if she lies on wrong type of pillow  PE Pleasant and in NAD BP 115/69   Ht 4' 10.5" (1.486 m)   Wt 105 lb (47.6 kg)   BMI 21.57 kg/m   Full ROM of neck with no pain Normal strength in upper exts No trapezius spasm today  Left knee Knee: Normal to inspection with no erythema or effusion or obvious bony abnormalities. Palpation normal with no warmth or joint line tenderness or patellar tenderness or condyle tenderness. ROM normal in flexion and extension and lower leg rotation. Ligaments with solid consistent endpoints including ACL, PCL, LCL, MCL. Negative Mcmurray's and provocative meniscal tests. Non painful patellar compression. Patellar and quadriceps tendons unremarkable. Hamstring and quadriceps strength is normal.  She does still have TTP over pes anserine No bruising at calf

## 2018-11-28 DIAGNOSIS — L501 Idiopathic urticaria: Secondary | ICD-10-CM | POA: Diagnosis not present

## 2018-12-07 DIAGNOSIS — D352 Benign neoplasm of pituitary gland: Secondary | ICD-10-CM | POA: Diagnosis not present

## 2018-12-07 DIAGNOSIS — H524 Presbyopia: Secondary | ICD-10-CM | POA: Diagnosis not present

## 2018-12-07 DIAGNOSIS — H5213 Myopia, bilateral: Secondary | ICD-10-CM | POA: Diagnosis not present

## 2018-12-07 DIAGNOSIS — H2513 Age-related nuclear cataract, bilateral: Secondary | ICD-10-CM | POA: Diagnosis not present

## 2018-12-10 DIAGNOSIS — R05 Cough: Secondary | ICD-10-CM | POA: Diagnosis not present

## 2018-12-10 DIAGNOSIS — L503 Dermatographic urticaria: Secondary | ICD-10-CM | POA: Diagnosis not present

## 2018-12-10 DIAGNOSIS — J3081 Allergic rhinitis due to animal (cat) (dog) hair and dander: Secondary | ICD-10-CM | POA: Diagnosis not present

## 2018-12-10 DIAGNOSIS — J3089 Other allergic rhinitis: Secondary | ICD-10-CM | POA: Diagnosis not present

## 2018-12-14 DIAGNOSIS — H00025 Hordeolum internum left lower eyelid: Secondary | ICD-10-CM | POA: Diagnosis not present

## 2018-12-25 DIAGNOSIS — H00019 Hordeolum externum unspecified eye, unspecified eyelid: Secondary | ICD-10-CM | POA: Diagnosis not present

## 2018-12-25 DIAGNOSIS — B373 Candidiasis of vulva and vagina: Secondary | ICD-10-CM | POA: Diagnosis not present

## 2018-12-25 DIAGNOSIS — H00025 Hordeolum internum left lower eyelid: Secondary | ICD-10-CM | POA: Diagnosis not present

## 2018-12-25 DIAGNOSIS — R509 Fever, unspecified: Secondary | ICD-10-CM | POA: Diagnosis not present

## 2019-01-14 DIAGNOSIS — L501 Idiopathic urticaria: Secondary | ICD-10-CM | POA: Diagnosis not present

## 2019-01-29 ENCOUNTER — Telehealth (INDEPENDENT_AMBULATORY_CARE_PROVIDER_SITE_OTHER): Payer: 59 | Admitting: Sports Medicine

## 2019-01-29 DIAGNOSIS — I83893 Varicose veins of bilateral lower extremities with other complications: Secondary | ICD-10-CM | POA: Diagnosis not present

## 2019-01-29 DIAGNOSIS — G90A Postural orthostatic tachycardia syndrome (POTS): Secondary | ICD-10-CM

## 2019-01-29 DIAGNOSIS — I498 Other specified cardiac arrhythmias: Secondary | ICD-10-CM | POA: Diagnosis not present

## 2019-01-29 DIAGNOSIS — Q796 Ehlers-Danlos syndrome, unspecified: Secondary | ICD-10-CM | POA: Diagnosis not present

## 2019-01-29 NOTE — Assessment & Plan Note (Signed)
Consider monitoring with Cardia XT

## 2019-01-29 NOTE — Progress Notes (Addendum)
Patient agrees to a virtual "my chart" visit.  Good video and connected audio by phone since not loud enough. She understands limitations of platform.  CC; Bruising behind RT knee/ upper calf  Patient with EDS Has had bruising behind left knee in same location 2/2 leaking VV documented with [place] This occurred after normal ADLs  Same thing has happened with RT knee Feels better after icing  EDS status Other joints have been doing better Working with Butch Penny at PT/Pilates Much less pain and better function  Diazepam has dramatically reduced the muscle spasms that were waking her at night Uses 1 hs and has been able to cut Ambien  CV Has highly variable HR She has had difficulty taking metoprolol with fatigue and low BP  Past Hx Stye in left eye post thanksgiving Healing slowly and then felt poor Tested + Covid Dec. 1 and mildly ill for several days  ROS No knee instability No locking or giving way No recnt joint subluxations or dislocations  Exam Patient alert and Ox3 NAD Home VS stable No other exam   I spent 32 minutes with this patient. Over 50% of visit was spend in counseling and coordination of care for problems with varicose veins and EDS.  Ila Mcgill, MD

## 2019-01-29 NOTE — Assessment & Plan Note (Signed)
Suspect new hematoma from VV Use conservative care Icing baby ASA and vit C No compression due to skin issues  See if this responds over next 4 wks

## 2019-01-29 NOTE — Assessment & Plan Note (Signed)
Improvement on long term pilates and PT intervention

## 2019-02-11 DIAGNOSIS — L501 Idiopathic urticaria: Secondary | ICD-10-CM | POA: Diagnosis not present

## 2019-03-11 DIAGNOSIS — L501 Idiopathic urticaria: Secondary | ICD-10-CM | POA: Diagnosis not present

## 2019-03-28 DIAGNOSIS — Z01419 Encounter for gynecological examination (general) (routine) without abnormal findings: Secondary | ICD-10-CM | POA: Diagnosis not present

## 2019-03-28 DIAGNOSIS — Z6822 Body mass index (BMI) 22.0-22.9, adult: Secondary | ICD-10-CM | POA: Diagnosis not present

## 2019-03-31 ENCOUNTER — Other Ambulatory Visit: Payer: Self-pay | Admitting: Sports Medicine

## 2019-04-08 DIAGNOSIS — L501 Idiopathic urticaria: Secondary | ICD-10-CM | POA: Diagnosis not present

## 2019-04-17 DIAGNOSIS — D126 Benign neoplasm of colon, unspecified: Secondary | ICD-10-CM | POA: Diagnosis not present

## 2019-04-17 DIAGNOSIS — Z13220 Encounter for screening for lipoid disorders: Secondary | ICD-10-CM | POA: Diagnosis not present

## 2019-04-17 DIAGNOSIS — R000 Tachycardia, unspecified: Secondary | ICD-10-CM | POA: Diagnosis not present

## 2019-04-17 DIAGNOSIS — Q796 Ehlers-Danlos syndrome, unspecified: Secondary | ICD-10-CM | POA: Diagnosis not present

## 2019-04-17 DIAGNOSIS — G4700 Insomnia, unspecified: Secondary | ICD-10-CM | POA: Diagnosis not present

## 2019-04-17 DIAGNOSIS — Z0000 Encounter for general adult medical examination without abnormal findings: Secondary | ICD-10-CM | POA: Diagnosis not present

## 2019-04-17 DIAGNOSIS — M5090 Cervical disc disorder, unspecified, unspecified cervical region: Secondary | ICD-10-CM | POA: Diagnosis not present

## 2019-04-25 ENCOUNTER — Other Ambulatory Visit: Payer: Self-pay

## 2019-04-25 ENCOUNTER — Ambulatory Visit: Payer: Self-pay

## 2019-04-25 ENCOUNTER — Ambulatory Visit (INDEPENDENT_AMBULATORY_CARE_PROVIDER_SITE_OTHER): Payer: 59 | Admitting: Sports Medicine

## 2019-04-25 VITALS — BP 126/64 | Ht 58.5 in | Wt 105.0 lb

## 2019-04-25 DIAGNOSIS — G8929 Other chronic pain: Secondary | ICD-10-CM

## 2019-04-25 DIAGNOSIS — M25561 Pain in right knee: Secondary | ICD-10-CM

## 2019-04-25 DIAGNOSIS — M25562 Pain in left knee: Secondary | ICD-10-CM | POA: Insufficient documentation

## 2019-04-25 DIAGNOSIS — M7522 Bicipital tendinitis, left shoulder: Secondary | ICD-10-CM

## 2019-04-25 DIAGNOSIS — M25512 Pain in left shoulder: Secondary | ICD-10-CM | POA: Diagnosis not present

## 2019-04-25 NOTE — Assessment & Plan Note (Signed)
Continue with her exercise program and with doing Pilates She probably cannot use a compression sleeve because of her skin hypersensitivity Localizing Topical Voltaren

## 2019-04-25 NOTE — Assessment & Plan Note (Signed)
I she will continue with exercises and Pilates We added biceps curls and forearm rolls Topical Voltaren over the area of tenderness  We will follow this up in 2 months

## 2019-04-25 NOTE — Progress Notes (Signed)
Patient comes in for follow-up of her Ehlers-Danlos and her problems with joint pain  Since last visit she has had significant left shoulder pain This tends to awaken her every 1-2 hours at night The left arm will go numb The pain is more anterior but radiates down the arm Sometimes it is triggered by neck position   The other significant pain since her last visit has been in the medial aspect of her left knee This seems tender to touch Feels worse after walking but actually walking tends to make the arm ache more than the knee  Review of systems Numbness but no radicular symptoms into the arms No locking or giving way of the knee No significant swelling in the knee  Physical exam Pleasant female in no acute distress BP 126/64   Ht 4' 10.5" (1.486 m)   Wt 105 lb (47.6 kg)   BMI 21.57 kg/m   Left shoulder No visible abnormality Mild point tenderness in the bicipital groove Good strength on testing each of the rotator cuff muscles Negative impingement testing Popping of the biceps tendon with testing  Knee: left Normal to inspection with no erythema or effusion or obvious bony abnormalities. Palpation normal with no warmth or joint line tenderness or patellar tenderness or condyle tenderness. ROM normal in flexion and extension and lower leg rotation. Ligaments with solid consistent endpoints including ACL, PCL, LCL, MCL.  However she does have more laxity than expected when testing ligaments Negative Mcmurray's and provocative meniscal tests. Non painful patellar compression. Patellar and quadriceps tendons unremarkable. Hamstring and quadriceps strength is normal. Localized tenderness on palpation of the medial joint line  Ultrasound of left shoulder There is a hypoechoic area suggestive of a partial tear of the biceps tendon noted on long and short axis There are intact fibers proximally and the tear which appears 2 cm below the insertion appears to cross about 50% of  the tendon Rotator cuff tendons appear normal There is moderate swelling in the bursa Posterior labrum and glenohumeral joint look normal The AC joint is widened and the clavicle is superiorly displaced  Impression: Partial tear of the bicipital tendon on the left; mild bursal swelling; questionable AC joint subluxation  Ultrasound and interpretation by Wolfgang Phoenix. Brown Dunlap, MD  Note I also took a look at the medial joint line and she has a parameniscal cystic swelling over the medial meniscus with other structures appear normal

## 2019-04-30 ENCOUNTER — Ambulatory Visit: Payer: 59 | Admitting: Sports Medicine

## 2019-05-06 DIAGNOSIS — L501 Idiopathic urticaria: Secondary | ICD-10-CM | POA: Diagnosis not present

## 2019-05-11 ENCOUNTER — Other Ambulatory Visit: Payer: Self-pay

## 2019-05-11 ENCOUNTER — Encounter (HOSPITAL_BASED_OUTPATIENT_CLINIC_OR_DEPARTMENT_OTHER): Payer: Self-pay | Admitting: Emergency Medicine

## 2019-05-11 ENCOUNTER — Emergency Department (HOSPITAL_BASED_OUTPATIENT_CLINIC_OR_DEPARTMENT_OTHER): Payer: 59

## 2019-05-11 ENCOUNTER — Emergency Department (HOSPITAL_BASED_OUTPATIENT_CLINIC_OR_DEPARTMENT_OTHER)
Admission: EM | Admit: 2019-05-11 | Discharge: 2019-05-11 | Disposition: A | Discharge status: Home / Self Care | Payer: 59 | Attending: Emergency Medicine | Admitting: Emergency Medicine

## 2019-05-11 DIAGNOSIS — R2242 Localized swelling, mass and lump, left lower limb: Secondary | ICD-10-CM | POA: Diagnosis not present

## 2019-05-11 DIAGNOSIS — Z79899 Other long term (current) drug therapy: Secondary | ICD-10-CM | POA: Insufficient documentation

## 2019-05-11 DIAGNOSIS — M79662 Pain in left lower leg: Secondary | ICD-10-CM | POA: Diagnosis not present

## 2019-05-11 DIAGNOSIS — M7989 Other specified soft tissue disorders: Secondary | ICD-10-CM | POA: Diagnosis not present

## 2019-05-11 DIAGNOSIS — R600 Localized edema: Secondary | ICD-10-CM | POA: Diagnosis present

## 2019-05-11 NOTE — ED Triage Notes (Signed)
Pt c/o left lower leg pain and swelling x 2 days. Pt reports knee injury x 2 weeks prior. Pt sent by [facility]. Pt denies sob

## 2019-05-11 NOTE — Discharge Instructions (Signed)
If you continue to have swelling and pain to your leg then you should have the ultrasound of your leg repeated in 7 to 10 days.  Please follow up with your primary care provider or with your orthopedic doctor within 5-7 days for re-evaluation of your symptoms.   Please return to the emergency department for any new or worsening symptoms.

## 2019-05-11 NOTE — ED Notes (Signed)
Pt discharged to home. Discharge instructions have been discussed with patient and/or family members. Pt verbally acknowledges understanding d/c instructions, and endorses comprehension to checkout at registration before leaving.  °

## 2019-05-11 NOTE — ED Provider Notes (Signed)
[facility] EMERGENCY DEPARTMENT Provider Note   CSN: [id number] Arrival date & time: 05/11/19  1112     History Chief Complaint  Patient presents with  . Leg Swelling    Marissa [name] is a 54 y.o. female.  HPI   53 year old female with a history of Ehlers-Danlos syndrome, pituitary tumor, ocular migraine, who presents the emergency department today complaining of left lower extremity pain/swelling.  She has had left knee pain and swelling for about the last 3 weeks.  She was seen by Wilfred Lacy fields with orthopedics who diagnosed her with a horizontal meniscal tear with a cyst present as well.  Yesterday she noticed that the lower part of her leg was swollen when it have not been previously.  States the calf feels tight and she has some pain to the lower part of the leg.  She tried elevation and ice which did improve the swelling somewhat however is not completely resolved.  She also noticed a very small bruise to the calf as well.  She was seen at [facility] prior to arrival and was sent here for further work-up.   Past Medical History:  Diagnosis Date  . Ehlers-Danlos syndrome   . History of pituitary tumor   . Ocular migraine     Patient Active Problem List   Diagnosis Date Noted  . Knee pain, right 04/25/2019  . Bicipital tendonitis of left shoulder 04/25/2019  . Varicose veins of bilateral lower extremities with other complications [id number]  . 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 POTS (postural orthostatic tachycardia syndrome) 08/29/2017  . Chronic pain syndrome 08/29/2017  . Chronic insomnia 08/29/2017  . Wrist pain, acute 06/02/2016  . Enthesopathy of right shoulder 01/28/2016  . Right shoulder pain 01/28/2016  . Family history of early CAD 11/30/2015  . Cervical disc disorder with radiculopathy of cervical region 05/13/2014  . Chronic right SI joint pain 08/28/2013  . Hip pain, right 06/25/2013  . Elbow pain, right 02/13/2013  . Bunionette  01/07/2013  . Ehlers-Danlos disease 11/08/2012  . Unspecified visual disturbance 04/19/2012  . 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 KNEE SURGERY N/A 2012  . SHOULDER SURGERY N/A 2009  . UTERINE FIBROID SURGERY N/A      OB History   No obstetric history on file.     Family History  Problem Relation Age of Onset  . Arthritis Mother   . Atrial fibrillation Mother   . Arthritis Father   . Diabetes Father   . Heart disease Father   . Hypertension Father   . Heart failure Father   . Breast cancer Neg Hx     Social History   Tobacco Use  . Smoking status: Never Smoker  . Smokeless tobacco: Never Used  Substance Use Topics  . Alcohol use: No  . Drug use: No    Home Medications Prior to Admission medications   Medication Sig Start Date End Date Taking? Authorizing Provider  diazepam (VALIUM) 5 MG tablet 1 po bid 10/25/18   Stefanie Libel, MD  Fexofenadine HCl [facility] ALLERGY PO) [facility]    [provider]  levocetirizine (XYZAL) 5 MG tablet Take 5 mg by mouth every evening.    [provider]  metoprolol succinate (TOPROL-XL) 25 MG 24 hr tablet Take 1 tablet (25 mg total) by mouth daily as needed. 11/16/17   Dorothy Spark, MD  Multiple Vitamin (MULTIVITAMIN) capsule  Take 1 capsule by mouth daily.    [provider]  norgestrel-ethinyl estradiol (CRYSELLE-28) 0.3-30 MG-MCG tablet Cryselle (28) 0.3 mg-30 mcg tablet  TAKE 1 TABLET BY MOUTH EVERY DAY CONTINUOUSLY    [provider]  predniSONE (DELTASONE) 20 MG tablet Take 1 tablet (20 mg total) by mouth 2 (two) times daily. 07/09/18   Stefanie Libel, MD  tiZANidine (ZANAFLEX) 2 MG tablet TAKE 1-2 TABLETS BY MOUTH AT BEDTIME AS NEEDED FOR MUSCLE SPAMS 04/11/18   Hilts, Legrand Como, MD  traMADol Veatrice Bourbon) 50 MG tablet 1 tab 4 times daily 07/05/18   Stefanie Libel, MD  Arvid Right 150 MG/ML prefilled syringe  06/27/18    [provider]  zolpidem (AMBIEN) 10 MG tablet TAKE ONE-HALF TO ONE TABLET BY MOUTH AT BEDTIME AS NEEDED 04/01/19   Stefanie Libel, MD    Allergies    Macrobid [nitrofurantoin monohyd macro] and Doxepin  Review of Systems   Review of Systems  Constitutional: Negative for fever.  Respiratory: Negative for shortness of breath.   Cardiovascular: Positive for leg swelling. Negative for chest pain.  Musculoskeletal:       Left leg pain  Skin: Positive for color change.  Neurological: Negative for weakness and numbness.    Physical Exam Updated Vital Signs BP (!) 150/77 (BP Location: Left Arm)   Pulse 91   Temp 98.3 F (36.8 C) (Oral)   Resp 18   Ht 4\' 10"  (1.473 m)   Wt 47.6 kg   SpO2 100%   BMI 21.95 kg/m   Physical Exam Vitals and nursing note reviewed.  Constitutional:      General: She is not in acute distress.    Appearance: She is well-developed.  HENT:     Head: Normocephalic and atraumatic.  Eyes:     Conjunctiva/sclera: Conjunctivae normal.  Cardiovascular:     Rate and Rhythm: Normal rate.  Pulmonary:     Effort: Pulmonary effort is normal.  Musculoskeletal:        General: Normal range of motion.     Cervical back: Neck supple.     Comments: Trace edema to the lle, no erythema or warmth to the calf. Mildly ttp to the lower calf. ttp to left knee along the medial joint line. dp pulses are 2+ and symmetric. Normal sensation. Brisk cap refill  Skin:    General: Skin is warm and dry.  Neurological:     Mental Status: She is alert.     ED Results / Procedures / Treatments   Labs (all labs ordered are listed, but only abnormal results are displayed) Labs Reviewed - No data to display  EKG None  Radiology US Venous Img Lower  Left (DVT Study)  Result Date: 05/11/2019 CLINICAL DATA:  54 year old female with left distal calf pain and swelling for the past 2 weeks after left meniscal tear. EXAM: LEFT LOWER EXTREMITY VENOUS DOPPLER ULTRASOUND  TECHNIQUE: Gray-scale sonography with graded compression, as well as color Doppler and duplex ultrasound were performed to evaluate the lower extremity deep venous systems from the level of the common femoral vein and including the common femoral, femoral, profunda femoral, popliteal and calf veins including the posterior tibial, peroneal and gastrocnemius veins when visible. The superficial great saphenous vein was also interrogated. Spectral Doppler was utilized to evaluate flow at rest and with distal augmentation maneuvers in the common femoral, femoral and popliteal veins. COMPARISON:  None. FINDINGS: Contralateral Common Femoral Vein: Respiratory phasicity is normal and symmetric with the symptomatic side.  No evidence of thrombus. Normal compressibility. Common Femoral Vein: No evidence of thrombus. Normal compressibility, respiratory phasicity and response to augmentation. Saphenofemoral Junction: No evidence of thrombus. Normal compressibility and flow on color Doppler imaging. Profunda Femoral Vein: No evidence of thrombus. Normal compressibility and flow on color Doppler imaging. Femoral Vein: No evidence of thrombus. Normal compressibility, respiratory phasicity and response to augmentation. Popliteal Vein: No evidence of thrombus. Normal compressibility, respiratory phasicity and response to augmentation. Calf Veins: No evidence of thrombus. Normal compressibility and flow on color Doppler imaging. Superficial Great Saphenous Vein: No evidence of thrombus. Normal compressibility. Venous Reflux:  None. Other Findings:  None. IMPRESSION: No evidence of deep venous thrombosis. Electronically Signed   By: Jacqulynn Cadet M.D.   On: 05/11/2019 12:45    Procedures Procedures (including critical care time)  Medications Ordered in ED Medications - No data to display  ED Course  I have reviewed the triage vital signs and the nursing notes.  Pertinent labs & imaging results that were available during  my care of the patient were reviewed by me and considered in my medical decision making (see chart for details).    MDM Rules/Calculators/A&P                      54 year old female presenting with left lower extremity swelling and pain that started yesterday.  Has also had left knee swelling for the last several weeks after being diagnosed with a meniscal tear.  Lower extremity ultrasound with no evidence of deep venous thrombosis  Reassessed pt. Discussed results and plan to continue ice and elevation. Advised that if sxs persist she will need to have repeat study in 7 to 10 days.  Advised that if she has any new or worsening symptoms she will need to return to the emergency department immediately.  She voiced understanding of plan and reasons to return.  All questions answered per patient able for discharge.  Final Clinical Impression(s) / ED Diagnoses Final diagnoses:  Leg swelling    Rx / DC Orders ED Discharge Orders    None       Bishop Dublin 05/11/19 1306    Gareth Morgan, MD 05/11/19 2252

## 2019-05-14 ENCOUNTER — Ambulatory Visit (INDEPENDENT_AMBULATORY_CARE_PROVIDER_SITE_OTHER): Payer: 59 | Admitting: Sports Medicine

## 2019-05-14 ENCOUNTER — Other Ambulatory Visit: Payer: Self-pay

## 2019-05-14 DIAGNOSIS — M25561 Pain in right knee: Secondary | ICD-10-CM | POA: Diagnosis not present

## 2019-05-14 DIAGNOSIS — G8929 Other chronic pain: Secondary | ICD-10-CM

## 2019-05-14 NOTE — Assessment & Plan Note (Signed)
With her ongoing symptoms she could have some significant cartilage damage within her right knee.  However with her Ehlers-Danlos syndrome I would favor continuing conservative care.  Her hypermobility makes her a poor candidate for arthroscopic surgery.  We will continue to have her work with physical therapy and do isometric exercises as well as straight leg raises.  She cannot use a compression sleeve because her skin hyper reacts.  We will try to have her use an Ace wrap for an hour at a time which is good to do significant standing or walking.  When able she is returned to doing some stationary biking.  I will check this again in 1 month

## 2019-05-14 NOTE — Progress Notes (Signed)
Chief complaint is left knee pain and swelling  On April 8 the patient had been seen with 2 to 3 weeks of medial left knee pain.  On that occasion an ultrasound showed a parameniscal cyst and a possible horizontal split in her meniscus.  She was treating this conservatively until on 05/10/2019 she had onset of more severe pain and swelling that caused swelling to dissect down into her calf.  This caused calf pain and she called the doctor on call.  Dr. Mikle Bosworth advised her to be seen in urgent care.  There they evaluated her and found there was no evidence of DVT on 424.  She has been icing and resting her leg and it feels somewhat better.  It still clicks and feels uncomfortable at times.  She feels the swelling is somewhat left and there is no tightness in her calf.  She returns for my evaluation.  Past history of Ehlers-Danlos syndrome and multiple musculoskeletal complications  Review of systems Still getting some radicular symptoms into her left arm Right knee does not always feel stable No true giving way or locking She does feel clicking inside the knee  Physical examination Pleasant female in no acute distress BP 119/70   Ht 4' 10.5" (1.486 m)   Wt 105 lb (47.6 kg)   BMI 21.57 kg/m   Right knee exam Knee: Normal to inspection with no erythema or effusion or obvious bony abnormalities. Palpation shows medial  joint line tenderness No patellar tenderness or condyle tenderness. ROM normal in flexion and extension and lower leg rotation. She feels some pain with rotation Ligaments with solid consistent endpoints including ACL, PCL, LCL, MCL. - however, there is generalized laxity Mcmurray's and provocative meniscal tests are painful and deferred Non painful patellar compression. Patellar and quadriceps tendons unremarkable.  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  Ultrasound and interpretation by Wolfgang Phoenix. Oneida Alar, MD

## 2019-05-27 DIAGNOSIS — R05 Cough: Secondary | ICD-10-CM | POA: Diagnosis not present

## 2019-05-27 DIAGNOSIS — J3089 Other allergic rhinitis: Secondary | ICD-10-CM | POA: Diagnosis not present

## 2019-05-27 DIAGNOSIS — J3081 Allergic rhinitis due to animal (cat) (dog) hair and dander: Secondary | ICD-10-CM | POA: Diagnosis not present

## 2019-05-27 DIAGNOSIS — L503 Dermatographic urticaria: Secondary | ICD-10-CM | POA: Diagnosis not present

## 2019-06-03 DIAGNOSIS — L501 Idiopathic urticaria: Secondary | ICD-10-CM | POA: Diagnosis not present

## 2019-06-07 ENCOUNTER — Other Ambulatory Visit: Payer: Self-pay | Admitting: Sports Medicine

## 2019-06-27 ENCOUNTER — Ambulatory Visit (INDEPENDENT_AMBULATORY_CARE_PROVIDER_SITE_OTHER): Payer: 59 | Admitting: Sports Medicine

## 2019-06-27 ENCOUNTER — Other Ambulatory Visit: Payer: Self-pay

## 2019-06-27 DIAGNOSIS — M25562 Pain in left knee: Secondary | ICD-10-CM

## 2019-06-27 DIAGNOSIS — G8929 Other chronic pain: Secondary | ICD-10-CM | POA: Diagnosis not present

## 2019-06-27 DIAGNOSIS — M7522 Bicipital tendinitis, left shoulder: Secondary | ICD-10-CM | POA: Diagnosis not present

## 2019-06-27 NOTE — Assessment & Plan Note (Signed)
This has improved with HEP and pilates Little pain now

## 2019-06-27 NOTE — Patient Instructions (Signed)
Add some gelatin supplement (jello)  Take beta blocker more regularly   Knee is taking time - keep up exercises Add some biking if possible Avoid prolonged standing  Too many steps  See me in 3 months

## 2019-06-27 NOTE — Progress Notes (Signed)
CC: Left knee pain  Follow up of meniscal injury Still painful Swelling is somewhat less A couple of episodes of giving way  Left neck is tight Spasm in trap Numbness into upper left arm at times  Continues on weekly pilates Has HEP that she does each morning for 30 mins  ROS More "POTS' sxs but HR monitor is variable No syncope Dizziness at times Ankle but no recent calf swelling  PE Pleasant and in NAD BP 100/68   Ht 4' 10.5" (1.486 m)   Wt 105 lb (47.6 kg)   BMI 21.57 kg/m   Spasm of left trapezius noted Good RC testing Neg OBrien test Norm strenth  Knee: left Normal to inspection except mild effusion  no obvious bony abnormalities. Palpation  no warmth but + medial joint line tenderness  no patellar tenderness or condyle tenderness. ROM normal in flexion and extension and lower leg rotation. Ligaments with solid consistent endpoints including ACL, PCL, LCL, MCL. Pain on Mcmurray's and provocative meniscal tests. Non painful patellar compression. Patellar and quadriceps tendons unremarkable. Hamstring and quadriceps strength is normal.

## 2019-06-27 NOTE — Assessment & Plan Note (Signed)
This is stable Still acts like degenerative meniscus Cont HEP Add some biking Reck 2 mos

## 2019-07-01 DIAGNOSIS — L501 Idiopathic urticaria: Secondary | ICD-10-CM | POA: Diagnosis not present

## 2019-07-18 ENCOUNTER — Other Ambulatory Visit: Payer: Self-pay | Admitting: Obstetrics and Gynecology

## 2019-07-18 DIAGNOSIS — Z1231 Encounter for screening mammogram for malignant neoplasm of breast: Secondary | ICD-10-CM

## 2019-07-29 ENCOUNTER — Other Ambulatory Visit: Payer: Self-pay | Admitting: Sports Medicine

## 2019-07-29 DIAGNOSIS — L501 Idiopathic urticaria: Secondary | ICD-10-CM | POA: Diagnosis not present

## 2019-08-01 ENCOUNTER — Telehealth: Payer: Self-pay | Admitting: Radiology

## 2019-08-01 ENCOUNTER — Ambulatory Visit (INDEPENDENT_AMBULATORY_CARE_PROVIDER_SITE_OTHER): Payer: 59 | Admitting: Cardiology

## 2019-08-01 ENCOUNTER — Other Ambulatory Visit: Payer: Self-pay

## 2019-08-01 ENCOUNTER — Encounter: Payer: Self-pay | Admitting: Cardiology

## 2019-08-01 VITALS — BP 102/68 | HR 84 | Ht 58.5 in | Wt 103.8 lb

## 2019-08-01 DIAGNOSIS — Q7962 Hypermobile Ehlers-Danlos syndrome: Secondary | ICD-10-CM | POA: Diagnosis not present

## 2019-08-01 DIAGNOSIS — R002 Palpitations: Secondary | ICD-10-CM

## 2019-08-01 DIAGNOSIS — R000 Tachycardia, unspecified: Secondary | ICD-10-CM

## 2019-08-01 NOTE — Progress Notes (Signed)
Cardiology Office Note    Date:  08/01/2019   ID:  MAKYRA CORPREW, DOB 08/26/1965, MRN [id number]  PCP:  Kelton Pillar, MD  Cardiologist:  Ena Dawley, MD   Chief complain: 1 year follow-up  History of Present Illness:  EMBERLIN VERNER is a 54 y.o. 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  The patient has a diagnosis of Ehlers-Danlos syndrome, she has tremendous orthopedic problems followed by Dr Stefanie Libel.  She has been experiencing palpitations and states that her heart rate is always high despite good hydration. She is not active in sports as she is limited with orthopedic issues, but walks most days without SOB or chest pain. No syncope.  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 She had an echocardiogram approximately 10 years ago and was told it was normal.  She has never smoked, doesn't have any children.   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 She has been going through physical therapy and using a lot of pain medication control. As a result she was experiencing dizziness and couldn't tolerate Toprol-XL that she stopped taking in January 2018. She has been experiencing a lot of palpitations and has been tachycardic almost all the time.On one occasion while in the pilate class she experienced dyspnea on exertion.  11/16/2017, the patient has developed erythema multiforme in February 2019 with severe presentation and blistering, extensive evaluation was performed including rheumatologic panel biopsies for vasculitis and the only abnormal lab was elevated CRP at 8, repeated CRP in August was 5.  The presentation was blamed on pain  medication that was discontinued, she was treated with steroids, now completely resolved.  She is dealing with a lot of problems related to Ehlers-Danlos syndrome, including hip subluxation, neck pain secondary to dislocated disc, bilateral shoulder pain and is very limited to exercises but still continues to do frequent Pilates.  She denies any chest pain shortness of breath dizziness or syncope.  She has occasional palpitations, she does not take metoprolol as there was concern that this might be causing her presentation as well.  07/10/2018, the patient is coming after 6 months she is doing well from cardiac standpoint denies any chest pain shortness of breath no palpitations.  No lower extremity edema.  She is dealing with significant neck pain secondary to disc protrusion in her cervical spine related to underlying diagnosis of Ehlers-Danlos syndrome.  08/01/2019, the patient is coming after 1 year, she has been experiencing more episodes of palpitation associated with dizziness mostly when she gets up but sometimes when she is sitting for prolonged time like after eating dinner.  This happens a few times a week.  She brings heart rate monitor that shows heart rate from upper 50s to low 100s.  She takes Toprol-XL as needed with no significant improvement.  She stays hydrated during the day, she is very active.  She has not had any falls.  She underwent lower extremity venous ultrasound for concern of DVT however it was negative.  Denies any chest pain or shortness of breath.   Past Medical History:  Diagnosis Date  . Ehlers-Danlos syndrome   . History  of pituitary tumor   . Ocular migraine    Past Surgical History:  Procedure Laterality Date  . KNEE SURGERY N/A 2012  . SHOULDER SURGERY N/A 2009  . UTERINE FIBROID SURGERY N/A    Current Medications: Outpatient Medications Prior to Visit  Medication Sig Dispense Refill  . diazepam (VALIUM) 5 MG tablet 1 po bid 60 tablet 3  . Fexofenadine HCl  (ALLEGRA ALLERGY PO) Allegra Allergy    . levocetirizine (XYZAL) 5 MG tablet Take 5 mg by mouth every evening.    . metoprolol succinate (TOPROL-XL) 25 MG 24 hr tablet Take 1 tablet (25 mg total) by mouth daily as needed. 90 tablet 3  . Multiple Vitamin (MULTIVITAMIN) capsule Take 1 capsule by mouth daily.    . norgestrel-ethinyl estradiol (CRYSELLE-28) 0.3-30 MG-MCG tablet Cryselle (28) 0.3 mg-30 mcg tablet  TAKE 1 TABLET BY MOUTH EVERY DAY CONTINUOUSLY    . traMADol (ULTRAM) 50 MG tablet Take 50 mg by mouth 4 (four) times daily as needed.    Arvid Right 150 MG/ML prefilled syringe     . zolpidem (AMBIEN) 10 MG tablet TAKE 1/2-1 TABLETS BY MOUTH AT BEDTIME AS NEEDED 30 tablet 3  . predniSONE (DELTASONE) 20 MG tablet Take 1 tablet (20 mg total) by mouth 2 (two) times daily. 14 tablet 0  . tiZANidine (ZANAFLEX) 2 MG tablet TAKE 1-2 TABLETS BY MOUTH AT BEDTIME AS NEEDED FOR MUSCLE SPAMS 60 tablet 6  . traMADol (ULTRAM) 50 MG tablet 1 tab 4 times daily 120 tablet 3   No facility-administered medications prior to visit.     Allergies:   Macrobid [nitrofurantoin monohyd macro] and Doxepin   Social History   Socioeconomic History  . Marital status: Single    Spouse name: Not on file  . Number of children: 0  . Years of education: Masters  . Highest education level: Not on file  Occupational History    Employer: [employer]  Tobacco Use  . Smoking status: Never Smoker  . Smokeless tobacco: Never Used  Vaping Use  . Vaping Use: Never used  Substance and Sexual Activity  . Alcohol use: No  . Drug use: No  . Sexual activity: Yes    Birth control/protection: Pill  Other Topics Concern  . Not on file  Social History Narrative  . Not on file   Social Determinants of Health   Financial Resource Strain:   . Difficulty of Paying Living Expenses:   Food Insecurity:   . Worried About Charity fundraiser in the Last Year:   . Arboriculturist in the Last Year:   Transportation  Needs:   . Film/video editor (Medical):   Marland Kitchen Lack of Transportation (Non-Medical):   Physical Activity:   . Days of Exercise per Week:   . Minutes of Exercise per Session:   Stress:   . Feeling of Stress :   Social Connections:   . Frequency of Communication with Friends and Family:   . Frequency of Social Gatherings with Friends and Family:   . Attends Religious Services:   . Active Member of Clubs or Organizations:   . Attends Archivist Meetings:   Marland Kitchen Marital Status:     Family History:  The patient's family history includes Arthritis in her father and mother; Atrial fibrillation in her mother; Diabetes in her father; Heart disease in her father; Heart failure in her father; Hypertension in her father.   ROS:   Please see  the history of present illness.    ROS All other systems reviewed and are negative.  PHYSICAL EXAM:   VS:  BP 102/68   Pulse 84   Ht 4' 10.5" (1.486 m)   Wt 103 lb 12.8 oz (47.1 kg)   SpO2 99%   BMI 21.32 kg/m    GEN: Well nourished, well developed, in no acute distress  HEENT: normal  Neck: no JVD, carotid bruits, or masses Cardiac: RRR; 1/6 systolic murmurs, rubs, or gallops,no edema  Respiratory:  clear to auscultation bilaterally, normal work of breathing GI: soft, nontender, nondistended, + BS MS: no deformity or atrophy  Skin: warm and dry, no rash Neuro:  Alert and Oriented x 3, Strength and sensation are intact Psych: euthymic mood, full affect  Wt Readings from Last 3 Encounters:  08/01/19 103 lb 12.8 oz (47.1 kg)  06/27/19 105 lb (47.6 kg)  05/14/19 105 lb (47.6 kg)    Studies/Labs Reviewed:   EKG:  EKG demonstrates NSR, EKG, unchanged from prior, personally reviewed.  Recent Labs: No results found for requested labs within last 8760 hours.   Lipid Panel    Component Value Date/Time   CHOL 173 11/04/2015 1046   TRIG 95 11/04/2015 1046   HDL 77 11/04/2015 1046   CHOLHDL 2.2 11/04/2015 1046   LDLCALC 78 11/04/2015  1046   TTE: 11/2015 Left ventricle:  The cavity size was normal. Systolic function was normal. The estimated ejection fraction was in the range of 55% to 60%. Wall motion was normal; there were no regional wall motion abnormalities. The transmitral flow pattern was normal. The deceleration time of the early transmitral flow velocity was normal. The pulmonary vein flow pattern was normal. The tissue Doppler parameters were normal. Left ventricular diastolic function parameters were normal. ------------------------------------------------------------------- Aortic valve:   Trileaflet; normal thickness leaflets. Mobility was not restricted.  Doppler:  Transvalvular velocity was within the normal range. There was no stenosis. There was no regurgitation. ------------------------------------------------------------------- Aorta:  Aortic root: The aortic root was normal in size. ------------------------------------------------------------------- Mitral valve:   Structurally normal valve.   Mobility was not restricted.  Doppler:  Transvalvular velocity was within the normal range. There was no evidence for stenosis. There was no regurgitation. ------------------------------------------------------------------- Left atrium:  The atrium was normal in size. ------------------------------------------------------------------- Right ventricle:  The cavity size was normal. Wall thickness was normal. Systolic function was normal.  EKG performed today 02/16/2017 shows sinus tachycardia otherwise normal EKG, heart rate is faster than previously  ASSESSMENT:    1. Palpitations   2. Inappropriate sinus tachycardia   3. Ehlers-Danlos syndrome, type 3     PLAN:  In order of problems listed above:  1. The patient carries a diagnosis of Ehlers-Danlos syndrome, a connective tissue disorder associated with valvular disease - her echo showed normal LVEF with normal size of the aortic root, no AI, mildly  thickened mitral valve with no prolapse and only trivial mitral regurgitation.  This has been stable on most recent echo in July 2020.   2. Palpitation, dizziness, appears as part of  POTS syndrome we will reevaluate 7-day Zio patch monitor.  Sinus tachycardia, inappropriate sinus tachycardia on Holter in 2017, she continues to take low-dose Toprol-XL, she is advised to apply mild incline to her bed at night, hydrate well and use electrolyte supplements.. 3. Hyperlipidemia - h/o and also in her father, father had MI in his 82', we will check and treat appropriately.Her lipids were all at goal in 2017.  Follow-up in 1 year.  Medication Adjustments/Labs and Tests Ordered: Current medicines are reviewed at length with the patient today.  Concerns regarding medicines are outlined above.  Medication changes, Labs and Tests ordered today are listed in the Patient Instructions below. Patient Instructions  Medication Instructions:   Your physician recommends that you continue on your current medications as directed. Please refer to the Current Medication list given to you today.  *If you need a refill on your cardiac medications before your next appointment, please call your pharmacy*  Testing/Procedures:  ZIO XT- Long Term Monitor Instructions   Your physician has requested you wear your ZIO patch monitor___7____days.   This is a single patch monitor.  [employer] supplies one patch monitor per enrollment.  Additional stickers are not available.   Please do not apply patch if you will be having a Nuclear Stress Test, Echocardiogram, Cardiac CT, MRI, or Chest Xray during the time frame you would be wearing the monitor. The patch cannot be worn during these tests.  You cannot remove and re-apply the ZIO XT patch monitor.   Your ZIO patch monitor will be sent USPS Priority mail from [facility] directly to your home address. The monitor may also be mailed to a PO BOX if home delivery is not  available.   It may take 3-5 days to receive your monitor after you have been enrolled.   Once you have received you monitor, please review enclosed instructions.  Your monitor has already been registered assigning a specific monitor serial # to you.   Applying the monitor   Shave hair from upper left chest.   Hold abrader disc by orange tab.  Rub abrader in 40 strokes over left upper chest as indicated in your monitor instructions.   Clean area with 4 enclosed alcohol pads .  Use all pads to assure are is cleaned thoroughly.  Let dry.   Apply patch as indicated in monitor instructions.  Patch will be place under collarbone on left side of chest with arrow pointing upward.   Rub patch adhesive wings for 2 minutes.Remove white label marked "1".  Remove white label marked "2".  Rub patch adhesive wings for 2 additional minutes.   While looking in a mirror, press and release button in center of patch.  A small green light will flash 3-4 times .  This will be your only indicator the monitor has been turned on.     Do not shower for the first 24 hours.  You may shower after the first 24 hours.   Press button if you feel a symptom. You will hear a small click.  Record Date, Time and Symptom in the Patient Log Book.   When you are ready to remove patch, follow instructions on last 2 pages of Patient Log Book.  Stick patch monitor onto last page of Patient Log Book.   Place Patient Log Book in [place] box.  Use locking tab on box and tape box closed securely.  The [employer] has [employer] on it.  Please place in mailbox as soon as possible.  Your physician should have your test results approximately 7 days after the monitor has been mailed back to [facility].   Call [place] at [phone] if you have questions regarding your ZIO XT patch monitor.  Call them immediately if you see an orange light blinking on your monitor.   If your monitor falls off in less  than 4 days contact  our Monitor department at [phone].  If your monitor becomes loose or falls off after 4 days call [employer] at [phone] for suggestions on securing your monitor.    Follow-Up:  IN 4 MONTHS IN THE OFFICE WITH DR. [name]      Signed, Ena Dawley, MD  08/01/2019 8:39 AM    [facility] [address], [place], [place]  [address] Phone: [phone]; Fax: [phone]

## 2019-08-01 NOTE — Patient Instructions (Addendum)
Medication Instructions:   Your physician recommends that you continue on your current medications as directed. Please refer to the Current Medication list given to you today.  *If you need a refill on your cardiac medications before your next appointment, please call your pharmacy*  Testing/Procedures:  ZIO XT- Long Term Monitor Instructions   Your physician has requested you wear your ZIO patch monitor___7____days.   This is a single patch monitor.  [employer] supplies one patch monitor per enrollment.  Additional stickers are not available.   Please do not apply patch if you will be having a Nuclear Stress Test, Echocardiogram, Cardiac CT, MRI, or Chest Xray during the time frame you would be wearing the monitor. The patch cannot be worn during these tests.  You cannot remove and re-apply the ZIO XT patch monitor.   Your ZIO patch monitor will be sent USPS Priority mail from [facility] directly to your home address. The monitor may also be mailed to a PO BOX if home delivery is not available.   It may take 3-5 days to receive your monitor after you have been enrolled.   Once you have received you monitor, please review enclosed instructions.  Your monitor has already been registered assigning a specific monitor serial # to you.   Applying the monitor   Shave hair from upper left chest.   Hold abrader disc by orange tab.  Rub abrader in 40 strokes over left upper chest as indicated in your monitor instructions.   Clean area with 4 enclosed alcohol pads .  Use all pads to assure are is cleaned thoroughly.  Let dry.   Apply patch as indicated in monitor instructions.  Patch will be place under collarbone on left side of chest with arrow pointing upward.   Rub patch adhesive wings for 2 minutes.Remove white label marked "1".  Remove white label marked "2".  Rub patch adhesive wings for 2 additional minutes.   While looking in a mirror, press and release button in center of  patch.  A small green light will flash 3-4 times .  This will be your only indicator the monitor has been turned on.     Do not shower for the first 24 hours.  You may shower after the first 24 hours.   Press button if you feel a symptom. You will hear a small click.  Record Date, Time and Symptom in the Patient Log Book.   When you are ready to remove patch, follow instructions on last 2 pages of Patient Log Book.  Stick patch monitor onto last page of Patient Log Book.   Place Patient Log Book in [place] box.  Use locking tab on box and tape box closed securely.  The [employer] has [employer] on it.  Please place in mailbox as soon as possible.  Your physician should have your test results approximately 7 days after the monitor has been mailed back to [facility].   Call [place] at [phone] if you have questions regarding your ZIO XT patch monitor.  Call them immediately if you see an orange light blinking on your monitor.   If your monitor falls off in less than 4 days contact our Monitor department at [phone].  If your monitor becomes loose or falls off after 4 days call [employer] at [phone] for suggestions on securing your monitor.    Follow-Up:  IN 4 MONTHS IN THE OFFICE WITH DR. [name]

## 2019-08-01 NOTE — Telephone Encounter (Signed)
Enrolled patient for a 7 day Zio monitor to be mailed to patients homes

## 2019-08-06 ENCOUNTER — Other Ambulatory Visit (INDEPENDENT_AMBULATORY_CARE_PROVIDER_SITE_OTHER): Payer: 59

## 2019-08-06 DIAGNOSIS — R002 Palpitations: Secondary | ICD-10-CM | POA: Diagnosis not present

## 2019-08-06 DIAGNOSIS — R000 Tachycardia, unspecified: Secondary | ICD-10-CM | POA: Diagnosis not present

## 2019-08-12 MED ORDER — PREDNISONE 10 MG (21) PO TBPK: ORAL_TABLET | ORAL | 0 refills | Status: DC

## 2019-08-20 MED ORDER — MELOXICAM 15 MG PO TABS: ORAL_TABLET | ORAL | 1 refills | Status: DC

## 2019-08-20 NOTE — Addendum Note (Signed)
Addended by: Cyd Silence on: 08/20/2019 01:55 PM   Modules accepted: Orders

## 2019-08-26 DIAGNOSIS — R000 Tachycardia, unspecified: Secondary | ICD-10-CM | POA: Diagnosis not present

## 2019-08-26 DIAGNOSIS — L501 Idiopathic urticaria: Secondary | ICD-10-CM | POA: Diagnosis not present

## 2019-08-26 DIAGNOSIS — R002 Palpitations: Secondary | ICD-10-CM | POA: Diagnosis not present

## 2019-08-27 ENCOUNTER — Telehealth: Payer: Self-pay | Admitting: Cardiology

## 2019-08-27 DIAGNOSIS — I498 Other specified cardiac arrhythmias: Secondary | ICD-10-CM

## 2019-08-27 DIAGNOSIS — I471 Supraventricular tachycardia: Secondary | ICD-10-CM

## 2019-08-27 DIAGNOSIS — G90A Postural orthostatic tachycardia syndrome (POTS): Secondary | ICD-10-CM

## 2019-08-27 DIAGNOSIS — R000 Tachycardia, unspecified: Secondary | ICD-10-CM

## 2019-08-27 DIAGNOSIS — R002 Palpitations: Secondary | ICD-10-CM

## 2019-08-27 NOTE — Telephone Encounter (Signed)
Informed the pt of her monitor results and recommendations, per Dr. Radford Pax, covering for Dr. [name].  Informed the pt that I will place the referral to Dr. Curt Bears with EP in the system, and send his scheduler Doylene Canning a message to call her back and arrange this consult appt.  Pt verbalized understanding and agrees with this plan.

## 2019-08-27 NOTE — Telephone Encounter (Signed)
-----   Message from Sueanne Margarita, MD sent at 08/27/2019  4:07 PM EDT ----- Patient is having multiple episodes of nonsustained atrial tachycardia.  Given her dx of POTS and soft Bp on last OV, cannot increase BB any further.  Please refer her to Dr. Curt Bears

## 2019-08-27 NOTE — Telephone Encounter (Signed)
New Message:    Pt is  calling to see if her Monitor results are ready please?

## 2019-08-27 NOTE — Telephone Encounter (Signed)
Message sent to our monitor tech to look into the status of where this pts monitor results are.  Will follow-up with the pt accordingly thereafter.

## 2019-08-27 NOTE — Telephone Encounter (Signed)
Per our Monitor [employer], they are working on importing these results to Dr. Francesca Oman in-basket.  Once results are imported and advised on, I will follow-up with the pt shortly thereafter.

## 2019-09-03 ENCOUNTER — Other Ambulatory Visit: Payer: Self-pay

## 2019-09-03 ENCOUNTER — Ambulatory Visit
Admission: RE | Admit: 2019-09-03 | Discharge: 2019-09-03 | Disposition: A | Discharge status: Home / Self Care | Payer: 59 | Source: Ambulatory Visit | Attending: Obstetrics and Gynecology | Admitting: Obstetrics and Gynecology

## 2019-09-03 DIAGNOSIS — Z1231 Encounter for screening mammogram for malignant neoplasm of breast: Secondary | ICD-10-CM | POA: Diagnosis not present

## 2019-09-10 ENCOUNTER — Telehealth: Payer: Self-pay | Admitting: Cardiology

## 2019-09-10 ENCOUNTER — Other Ambulatory Visit: Payer: Self-pay

## 2019-09-10 ENCOUNTER — Ambulatory Visit (INDEPENDENT_AMBULATORY_CARE_PROVIDER_SITE_OTHER): Payer: 59 | Admitting: Cardiology

## 2019-09-10 ENCOUNTER — Encounter: Payer: Self-pay | Admitting: Cardiology

## 2019-09-10 VITALS — BP 106/74 | HR 98 | Ht 58.5 in | Wt 102.2 lb

## 2019-09-10 DIAGNOSIS — R002 Palpitations: Secondary | ICD-10-CM

## 2019-09-10 MED ORDER — DILTIAZEM HCL ER COATED BEADS 120 MG PO CP24: 120.0000 mg | ORAL_CAPSULE | Freq: Every day | ORAL | 3 refills | Status: DC

## 2019-09-10 NOTE — Patient Instructions (Addendum)
Medication Instructions:  Your physician has recommended you make the following change in your medication:  1. STOP Toprol (Metoprolol Succinate)  2. START Diltiazem 120 mg once daily  *If you need a refill on your cardiac medications before your next appointment, please call your pharmacy*   Lab Work: None ordered   Testing/Procedures: None ordered   Follow-Up: At [facility], you and your health needs are our priority.  As part of our continuing mission to provide you with exceptional heart care, we have created designated Provider Care Teams.  These Care Teams include your primary Cardiologist (physician) and Advanced Practice Providers (APPs -  Physician Assistants and Nurse Practitioners) who all work together to provide you with the care you need, when you need it.  We recommend signing up for the patient portal called "MyChart".  Sign up information is provided on this After Visit Summary.  MyChart is used to connect with patients for Virtual Visits (Telemedicine).  Patients are able to view lab/test results, encounter notes, upcoming appointments, etc.  Non-urgent messages can be sent to your provider as well.   To learn more about what you can do with MyChart, go to [email or web address].    Your next appointment:   3 month(s)  The format for your next appointment:   In Person  Provider:   Allegra Lai, MD   Thank you for choosing [facility]!!   Trinidad Curet, RN [phone]    Other Instructions   Diltiazem Oral Tablets What is this medicine? DILTIAZEM (dil TYE a zem) is a calcium channel blocker. It relaxes your blood vessels and decreases the amount of work the heart has to do. It treats and/or prevents chest pain (also called angina). This medicine may be used for other purposes; ask your health care provider or pharmacist if you have questions. COMMON BRAND NAME(S): Cardizem What should I tell my health care provider before I take this  medicine? They need to know if you have any of these conditions:  heart attack  heart disease  irregular heartbeat or rhythm  low blood pressure  an unusual or allergic reaction to diltiazem, other drugs, foods, dyes, or preservatives  pregnant or trying to get pregnant  breast-feeding How should I use this medicine? Take this drug by mouth. Take it as directed on the prescription label at the same time every day. Keep taking it unless your health care provider tells you to stop. Talk to your health care provider about the use of this drug in children. Special care may be needed. Overdosage: If you think you have taken too much of this medicine contact a poison control center or emergency room at once. NOTE: This medicine is only for you. Do not share this medicine with others. What if I miss a dose? If you miss a dose, take it as soon as you can. If it is almost time for your next dose, take only that dose. Do not take double or extra doses. What may interact with this medicine? Do not take this medicine with any of the following:  cisapride  hawthorn  pimozide  ranolazine  red yeast rice This medicine may also interact with the following medications:  buspirone  carbamazepine  cimetidine  cyclosporine  digoxin  local anesthetics or general anesthetics  lovastatin  medicines for anxiety or difficulty sleeping like midazolam and triazolam  medicines for high blood pressure or heart problems  quinidine  rifampin, rifabutin, or rifapentine This list may not describe all possible  interactions. Give your health care provider a list of all the medicines, herbs, non-prescription drugs, or dietary supplements you use. Also tell them if you smoke, drink alcohol, or use illegal drugs. Some items may interact with your medicine. What should I watch for while using this medicine? Visit your health care provider for regular checks on your progress. Check your blood  pressure as directed. Ask your health care provider what your blood pressure should be. Also, find out when you should contact him or her. Do not treat yourself for coughs, colds, or pain while you are using this drug without asking your health care provider for advice. Some drugs may increase your blood pressure. This drug may cause serious skin reactions. They can happen weeks to months after starting the drug. Contact your health care provider right away if you notice fevers or flu-like symptoms with a rash. The rash may be red or purple and then turn into blisters or peeling of the skin. Or, you might notice a red rash with swelling of the face, lips or lymph nodes in your neck or under your arms. You may get drowsy or dizzy. Do not drive, use machinery, or do anything that needs mental alertness until you know how this drug affects you. Do not stand up or sit up quickly, especially if you are an older patient. This reduces the risk of dizzy or fainting spells. What side effects may I notice from receiving this medicine? Side effects that you should report to your doctor or health care provider as soon as possible:  allergic reactions (skin rash, itching or hives; swelling of the face, lips, or tongue)  heart failure (trouble breathing; fast, irregular heartbeat; sudden weight gain; swelling of the ankles, feet, hands; unusually weak or tired)  heartbeat rhythm changes (trouble breathing; chest pain; dizziness; fast, irregular heartbeat; feeling faint or lightheaded, falls)  liver injury (dark yellow or brown urine; general ill feeling or flu-like symptoms; loss of appetite, right upper belly pain; unusually weak or tired, yellowing of the eyes or skin)  low blood pressure (dizziness; feeling faint or lightheaded, falls; unusually weak or tired)  redness, blistering, peeling, or loosening of the skin, including inside the mouth Side effects that usually do not require medical attention (report  to your doctor or health care provider if they continue or are bothersome):  changes in sex drive or performance  depressed mood  headache  sudden weight gain  nausea  sudden weight gain  trouble sleeping This list may not describe all possible side effects. Call your doctor for medical advice about side effects. You may report side effects to FDA at 1-800-FDA-1088. Where should I keep my medicine? Keep out of the reach of children and pets. Store at room temperature between 15 and 30 degrees C (59 and 86 degrees F). Protect from moisture. Keep the container tightly closed. Throw away any unused drug after the expiration date. NOTE: This sheet is a summary. It may not cover all possible information. If you have questions about this medicine, talk to your doctor, pharmacist, or health care provider.  2020 Elsevier/Gold Standard (2018-10-09 18:13:24)

## 2019-09-10 NOTE — Progress Notes (Addendum)
Electrophysiology Office Note   Date:  09/10/2019   ID:  Marissa [name], DOB 1965-03-24, MRN [id number]  PCP:  Kelton Pillar, MD  Cardiologist:  [name] Primary Electrophysiologist:  Caitriona Sundquist Meredith Leeds, MD    Chief Complaint: palpitations   History of Present Illness: Marissa [name] is a 54 y.o. female who is being seen today for the evaluation of palpitations at the request of Katrina Nelaon. Presenting today for electrophysiology evaluation.  She has a history of Ehlers Danlos syndrome, pituitary tumor, and ocular migraines.  She also has episodic palpitations.  She feels that her heart rates are always high despite good hydration.  She is not active in sports and limited with orthopedic issues, but does exercise doing [place] without shortness of breath or chest pain.  She also has a history of anorexia nervosa while in college.   Today, she denies symptoms of chest pain, shortness of breath, orthopnea, PND, lower extremity edema, claudication, dizziness, presyncope, syncope, bleeding, or neurologic sequela.  She continues to have episodic palpitations.  She has also episodes where she feels dizzy and has to sit down on the floor.  He is not necessarily associated with changes in position.  She is currently on Toprol-XL, but this has been difficult to titrate due to low blood pressures.  Over the past few months her palpitations have worsened.  She did wear a cardiac monitor that showed short episodes of atrial tachycardia.  Each of her triggered events showed sinus rhythm with rates in the 80s to 90s.  She also had episodes with heart rates in the 130s in the middle of the day that she did not trigger, but she does say that she was aware of these episodes.   Past Medical History:  Diagnosis Date  . Ehlers-Danlos syndrome   . History of pituitary tumor   . Ocular migraine    Past Surgical History:  Procedure Laterality Date  . KNEE SURGERY N/A 2012  . SHOULDER SURGERY N/A 2009  .  UTERINE FIBROID SURGERY N/A      Current Outpatient Medications  Medication Sig Dispense Refill  . diazepam (VALIUM) 5 MG tablet 1 po bid 60 tablet 3  . Fexofenadine HCl (ALLEGRA ALLERGY PO) Allegra Allergy    . levocetirizine (XYZAL) 5 MG tablet Take 5 mg by mouth every evening.    . meloxicam (MOBIC) 15 MG tablet Take one pill a day with food for 7 days and then prn thereafter 40 tablet 1  . Multiple Vitamin (MULTIVITAMIN) capsule Take 1 capsule by mouth daily.    . norgestrel-ethinyl estradiol (CRYSELLE-28) 0.3-30 MG-MCG tablet Cryselle (28) 0.3 mg-30 mcg tablet  TAKE 1 TABLET BY MOUTH EVERY DAY CONTINUOUSLY    . traMADol (ULTRAM) 50 MG tablet Take 50 mg by mouth 4 (four) times daily as needed.    Arvid Right 150 MG/ML prefilled syringe     . zolpidem (AMBIEN) 10 MG tablet TAKE 1/2-1 TABLETS BY MOUTH AT BEDTIME AS NEEDED 30 tablet 3   No current facility-administered medications for this visit.    Allergies:   Macrobid [nitrofurantoin monohyd macro] and Doxepin   Social History:  The patient  reports that she has never smoked. She has never used smokeless tobacco. She reports that she does not drink alcohol and does not use drugs.   Family History:  The patient's family history includes Arthritis in her father and mother; Atrial fibrillation in her mother; Diabetes in her father; Heart disease in her father; Heart  failure in her father; Hypertension in her father.    ROS:  Please see the history of present illness.   Otherwise, review of systems is positive for none.   All other systems are reviewed and negative.    PHYSICAL EXAM: VS:  BP 106/74   Pulse 98   Ht 4' 10.5" (1.486 m)   Wt 102 lb 3.2 oz (46.4 kg)   SpO2 98%   BMI 21.00 kg/m  , BMI Body mass index is 21 kg/m. GEN: Well nourished, well developed, in no acute distress  HEENT: normal  Neck: no JVD Cardiac: RRR; no murmurs, rubs, or gallops,no edema  Respiratory:  clear to auscultation bilaterally, normal work of  breathing MS: no deformity or atrophy  Skin: warm and dry Neuro:  Grossly normal Psych: euthymic mood, full affect  EKG:  EKG is not ordered today. Personal review of the ekg ordered 08/01/19 shows sinus rhythm, rate 73  Recent Labs: No results found for requested labs within last 8760 hours.    Lipid Panel     Component Value Date/Time   CHOL 173 11/04/2015 1046   TRIG 95 11/04/2015 1046   HDL 77 11/04/2015 1046   CHOLHDL 2.2 11/04/2015 1046   LDLCALC 78 11/04/2015 1046     Wt Readings from Last 3 Encounters:  09/10/19 102 lb 3.2 oz (46.4 kg)  08/01/19 103 lb 12.8 oz (47.1 kg)  06/27/19 105 lb (47.6 kg)      Other studies Reviewed: Additional studies/ records that were reviewed today include: TTE 07/18/18  Review of the above records today demonstrates:  1. The left ventricle has normal systolic function with an ejection  fraction of 60-65%. The cavity size was normal. Left ventricular diastolic  parameters were normal.  2. The right ventricle has normal systolic function. The cavity was  normal. There is no increase in right ventricular wall thickness.  3. Mild thickening of the mitral valve leaflet.  4. The aortic valve is tricuspid. Mild thickening of the aortic valve.  5. The aortic root is normal in size and structure.  6. Limited views of the aortic sinus and very proximal root seen and  normal in diameter.   Monitor 08/27/19  Sinus bradycardia, normal sinus rhythm and sinus tachycardia. The average heart rate was 78bpm and ranged from 49-143bpm.  Rare PVC  Multiple runs of nonsustained atrial tacycardia up to 10.6 seconds in duration and fastest HR 187bpm.   ASSESSMENT AND PLAN:  1.  Palpitations with inappropriate sinus tachycardia:Wore a cardiac monitor that showed episodes of possible atrial tachycardia with heart rates of 187 bpm.  These lasted up to 10 seconds.  She also had symptoms on her monitor with sinus rhythm in the 90s.  She also had  episodes of sinus tachycardia in the middle of the day when she feels that she was not being active.  She is on Toprol-XL 25 mg.  Increasing this medication has been limited by her blood pressures.  I do feel that she would benefit from further rate control to see if we can get her feeling better.  I Amiah Frohlich stop her Toprol-XL and start her on diltiazem 120 mg.  This can be increased as her blood pressure allows.    Case discussed with primary cardiologist  Current medicines are reviewed at length with the patient today.   The patient does not have concerns regarding her medicines.  The following changes were made today: Stop metoprolol, start diltiazem  Labs/ tests ordered  today include:  No orders of the defined types were placed in this encounter.    Disposition:   FU with Ahley Bulls 3 months  Signed, [place] Mullett Meredith Leeds, MD  09/10/2019 12:15 PM     [address] [place] [id number] [phone] (office) [phone] (fax)

## 2019-09-10 NOTE — Telephone Encounter (Signed)
Patient calling stating she had her appointment today and her after visit summary has some incorrect information.

## 2019-09-11 DIAGNOSIS — Z13820 Encounter for screening for osteoporosis: Secondary | ICD-10-CM | POA: Diagnosis not present

## 2019-09-11 NOTE — Telephone Encounter (Addendum)
Pt would like her office visit from yesterday to be amended, "it is not an accurate reflexion of what was done".   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  First, "in the narrative part", ehlers danlos syndrome is not spelled correctly -- the note says "either Danlos".  She understands it was probably dictation that didn't pick it up correctly, but would like it corrected. Second "the note reflects that he did a full physical exam", and "he did not".  "He listened to my heart, but nothing else".  She would like his note to reflect the correct exam and that it shouldn't reflect that neck, GI, neuro etc were examined. Note also states that she walks everyday, but this is not what was discussed. She does piliates but walking was never stated.  Note also states she is tolerating medications without difficulty and that is not correct, "this is why Dr. [name] sent me to him". She couldn't tolerate higher dose d/t low BP.  Pt would appreciate his note to be amended to reflect correct information.  Made aware that I would forward to Dr. Curt Bears to make changes. Pt appreciates the help with this.

## 2019-09-12 ENCOUNTER — Telehealth: Payer: Self-pay | Admitting: Cardiology

## 2019-09-12 NOTE — Telephone Encounter (Signed)
Pt is calling to let Dr. Curt Bears know that since starting diltiazem, she has had dizziness and feeling lightheaded, same issues she had in the past with the use of beta blockers. Pt states she is very sensitive with medications.  Pt states she will give the medicine a couple more days to see if her symptoms improve, and will call to get Sherri RN an update about this  Pt states most antihypertensive medications she has tried in the past cause her these same issues.  Informed the pt that I will route this message to Dr. Curt Bears and his RN this message, to make them aware of her issues.  Advised the pt to start monitoring her BP/HR and logging this.  Advised her if her symptoms worsen, then she needs to call and make [place] aware of this, vs waiting to see if her toleration to the medicine occurs overtime.  Pt verbalized understanding and agrees with this plan.

## 2019-09-12 NOTE — Telephone Encounter (Signed)
° °  Pt would like to speak with RN Karlene Einstein, she have some question to ask her

## 2019-09-18 ENCOUNTER — Telehealth: Payer: Self-pay | Admitting: Cardiology

## 2019-09-18 NOTE — Telephone Encounter (Signed)
Ok with me 

## 2019-09-18 NOTE — Telephone Encounter (Signed)
New message   Message received from Dr. Caryl Comes asking to schedule pt an appt with him at her request. Would this be ok?

## 2019-09-19 ENCOUNTER — Institutional Professional Consult (permissible substitution): Payer: 59 | Admitting: Cardiology

## 2019-09-24 DIAGNOSIS — L501 Idiopathic urticaria: Secondary | ICD-10-CM | POA: Diagnosis not present

## 2019-09-25 ENCOUNTER — Ambulatory Visit (INDEPENDENT_AMBULATORY_CARE_PROVIDER_SITE_OTHER): Payer: 59 | Admitting: Internal Medicine

## 2019-09-25 ENCOUNTER — Other Ambulatory Visit: Payer: Self-pay

## 2019-09-25 VITALS — BP 113/72 | HR 83 | Ht <= 58 in | Wt 102.8 lb

## 2019-09-25 DIAGNOSIS — I498 Other specified cardiac arrhythmias: Secondary | ICD-10-CM | POA: Diagnosis not present

## 2019-09-25 DIAGNOSIS — G90A Postural orthostatic tachycardia syndrome (POTS): Secondary | ICD-10-CM

## 2019-09-25 NOTE — Progress Notes (Signed)
ELECTROPHYSIOLOGY OFFICE NOTE  Patient ID: Marissa [name], MRN: [id number], DOB/AGE: 1965/06/02 54 y.o. Admit date: (Not on file) Date of Consult: 09/25/2019  Primary Physician: Kelton Pillar, MD Primary Cardiologist: Marissa [name] is a 54 y.o. female who is being seen today for the evaluation of PALPITATIONS  at the     HPI Marissa [name] is a 54 y.o. female who reached out to me because of palpitations in the context of a complexed medical history.  She carries a diagnosis of POTS made at [facility] in the late 1990s following a tilt table test.  She has had longstanding history of orthostatic intolerance, heat intolerance and shower intolerance/violaceous discoloration more recently this has become more problematic.  Lightheaded spells with prolonged standing are often accompanied by flushing and recovery fatigue.  They have been more recently associated with presyncope which she has been able to abort by lying down.  She has not had no syncope. Because of palpitations, she underwent event recording.  This Event Recorder personnally reviewed  Triggered/diary events >> sinus heart rates 80s to low 100s 10 episodes Non sustained atrial tach longest 10sec @ 140-180bpm none of which was symptomatic.  Dr. Radford Pax, covering for Dr. [name] referred her to Dr. Curt Bears.  He chose to treat her with diltiazem stopping her metoprolol.  She has also had 3 episodes relatively recently of postprandial presyncope.  Her diet is fluid replete but salt deplete.  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  She has been managed over the last 10 years by Dr. Oneida Alar to great success.  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   Interestingly, this has however made compression wear intolerable because of superficial mast cell activation from the pressure.  Significant sleeping problems most recently addressed with some to help by Valium.  Anxiety is part of the constellation    DATE TEST EF   11/17 Echo  60-65%   7/20 Echo  60-65%           Past Medical History:  Diagnosis Date  . Ehlers-Danlos syndrome   . History of pituitary tumor   . Ocular migraine       Surgical History:  Past Surgical History:  Procedure Laterality Date  . KNEE SURGERY N/A 2012  . SHOULDER SURGERY N/A 2009  . UTERINE FIBROID SURGERY N/A      Home Meds: Current Meds  Medication Sig  . Fexofenadine HCl (ALLEGRA ALLERGY PO) Allegra Allergy  . levocetirizine (XYZAL) 5 MG tablet Take 5 mg by mouth every evening.  . meloxicam (MOBIC) 15 MG tablet Take one pill a day with food for 7 days and then prn thereafter  . Multiple Vitamin (MULTIVITAMIN) capsule Take 1 capsule by mouth daily.  . norgestrel-ethinyl estradiol (CRYSELLE-28) 0.3-30 MG-MCG tablet Cryselle (28) 0.3 mg-30 mcg tablet  TAKE 1 TABLET BY MOUTH EVERY DAY CONTINUOUSLY  . traMADol (ULTRAM) 50 MG tablet Take 50 mg by mouth 4 (four) times daily as needed.  Arvid Right 150 MG/ML prefilled syringe   . zolpidem (AMBIEN) 10 MG tablet TAKE 1/2-1 TABLETS BY MOUTH AT BEDTIME AS NEEDED    Allergies:  Allergies  Allergen Reactions  . Macrobid [employer] Macro]   . Doxepin Rash    Social  History   Socioeconomic History  . Marital status: Single    Spouse name: Not on file  . Number of children: 0  . Years of education: Masters  . Highest education level: Not on file  Occupational History    Employer: [employer]  Tobacco Use  . Smoking status: Never Smoker  . Smokeless tobacco: Never Used  Vaping Use  . Vaping Use: Never used  Substance and Sexual Activity  . Alcohol use: No  . Drug use: No  . Sexual activity: Yes    Birth  control/protection: Pill  Other Topics Concern  . Not on file  Social History Narrative  . Not on file   Social Determinants of Health   Financial Resource Strain:   . Difficulty of Paying Living Expenses: Not on file  Food Insecurity:   . Worried About Charity fundraiser in the Last Year: Not on file  . Ran Out of Food in the Last Year: Not on file  Transportation Needs:   . Lack of Transportation (Medical): Not on file  . Lack of Transportation (Non-Medical): Not on file  Physical Activity:   . Days of Exercise per Week: Not on file  . Minutes of Exercise per Session: Not on file  Stress:   . Feeling of Stress : Not on file  Social Connections:   . Frequency of Communication with Friends and Family: Not on file  . Frequency of Social Gatherings with Friends and Family: Not on file  . Attends Religious Services: Not on file  . Active Member of Clubs or Organizations: Not on file  . Attends Archivist Meetings: Not on file  . Marital Status: Not on file  Intimate Partner Violence:   . Fear of Current or Ex-Partner: Not on file  . Emotionally Abused: Not on file  . Physically Abused: Not on file  . Sexually Abused: Not on file     Family History  Problem Relation Age of Onset  . Arthritis Mother   . Atrial fibrillation Mother   . Arthritis Father   . Diabetes Father   . Heart disease Father   . Hypertension Father   . Heart failure Father   . Breast cancer Neg Hx      ROS:  Please see the history of present illness.     All other systems reviewed and negative.    Physical Exam:  Blood pressure 113/72, pulse 83, height 4\' 10"  (1.473 m), weight 102 lb 12.8 oz (46.6 kg). General: Well developed, well nourished female in no acute distress. Head: Normocephalic, atraumatic, sclera non-icteric, no xanthomas, nares are without discharge. EENT: normal  Lymph Nodes:  none Neck: Negative for carotid bruits. JVD not elevated. Back:without scoliosis kyphosis    Lungs: Clear bilaterally to auscultation without wheezes, rales, or rhonchi. Breathing is unlabored. Heart: RRR with S1 S2. No  * murmur . No rubs, or gallops appreciated. Abdomen: Soft, non-tender, non-distended with normoactive bowel sounds. No hepatomegaly. No rebound/guarding. No obvious abdominal masses. Msk:  Strength and tone appear normal for age. Extremities: No clubbing or cyanosis. No  edema.  Distal pedal pulses are 2+ and equal bilaterally. Skin: Warm and Dry Neuro: Alert and oriented X 3. CN III-XII intact Grossly normal sensory and motor function . Psych:  Responds to questions appropriately with a normal affect.      Labs: Cardiac Enzymes No results for input(s): CKTOTAL, CKMB, TROPONINI in the last 72 hours. CBC Lab Results  Component  Value Date   WBC 9.8 03/12/2017   HGB 13.4 03/12/2017   HCT 39.8 03/12/2017   MCV 92.8 03/12/2017   PLT 353 03/12/2017   PROTIME: No results for input(s): LABPROT, INR in the last 72 hours. Chemistry No results for input(s): NA, K, CL, CO2, BUN, CREATININE, CALCIUM, PROT, BILITOT, ALKPHOS, ALT, AST, GLUCOSE in the last 168 hours.  Invalid input(s): LABALBU Lipids Lab Results  Component Value Date   CHOL 173 11/04/2015   HDL 77 11/04/2015   LDLCALC 78 11/04/2015   TRIG 95 11/04/2015   BNP No results found for: PROBNP Thyroid Function Tests: No results for input(s): TSH, T4TOTAL, T3FREE, THYROIDAB in the last 72 hours.  Invalid input(s): FREET3 Miscellaneous No results found for: DDIMER  Radiology/Studies:  MM 3D SCREEN BREAST BILATERAL  Result Date: 09/04/2019 CLINICAL DATA:  Screening. EXAM: DIGITAL SCREENING BILATERAL MAMMOGRAM WITH TOMO AND CAD COMPARISON:  Previous exam(s). ACR Breast Density Category d: The breast tissue is extremely dense, which lowers the sensitivity of mammography FINDINGS: There are no findings suspicious for malignancy. Images were processed with CAD. IMPRESSION: No mammographic evidence of  malignancy. A result letter of this screening mammogram will be mailed directly to the patient. RECOMMENDATION: Screening mammogram in one year. (Code:SM-B-01Y) BI-RADS CATEGORY  1: Negative. Electronically Signed   By: Abelardo Diesel M.D.   On: 09/04/2019 09:51   LONG TERM MONITOR (3-14 DAYS)  Result Date: 08/27/2019  Sinus bradycardia, normal sinus rhythm and sinus tachycardia. The average heart rate was 78bpm and ranged from 49-143bpm.  Rare PVC  Multiple runs of nonsustained atrial tacycardia up to 10.6 seconds in duration and fastest HR 187bpm.     EKG: Sinus at 77 Intervals 13/09/41 Otherwise normal   Assessment and Plan:  Autonomic insufficiency with orthostatic intolerance  Hypermobility EDS  Mast cell activation disorder   The patient has ongoing challenges with autonomic insufficiency and orthostatic intolerance in the context of her hypermobility EDS disorder.  The latter has been managed exceedingly well by Dr. Oneida Alar.  I have encouraged her to reach out to him about increasing her benzodiazepines to help her sleep and hopefully this will help her pain.  We discussed extensively the issues of dysautonomia, the physiology of orthstasis and positional stress.  We discussed the role of salt and water repletion, and the awareness of triggers and the role of ambient heat and dehydration.  She is unable to wear compression wear because of her mast cell disorder.  I gave her the name of a number of salt supplements with the goal of 2 g of sodium per day.  In the event that this helps but not enough, we can consider adding fludrocortisone.  Today her vital signs are inconsistent with POTS.  There is some suggestion of orthostatic hypotension.  Hence, beta-blockers or calcium blockers would expect to symptomatically aggravate her symptoms as in fact happened.  Ivabradine may be of some benefit.      Marissa [name]

## 2019-09-25 NOTE — Patient Instructions (Signed)
Medication Instructions:  Your physician recommends that you continue on your current medications as directed. Please refer to the Current Medication list given to you today.  *If you need a refill on your cardiac medications before your next appointment, please call your pharmacy*   Lab Work: None ordered.  If you have labs (blood work) drawn today and your tests are completely normal, you will receive your results only by: Marland Kitchen MyChart Message (if you have MyChart) OR . A paper copy in the mail If you have any lab test that is abnormal or we need to change your treatment, we will call you to review the results.   Testing/Procedures: None ordered.    Follow-Up: At [facility], you and your health needs are our priority.  As part of our continuing mission to provide you with exceptional heart care, we have created designated Provider Care Teams.  These Care Teams include your primary Cardiologist (physician) and Advanced Practice Providers (APPs -  Physician Assistants and Nurse Practitioners) who all work together to provide you with the care you need, when you need it.  We recommend signing up for the patient portal called "MyChart".  Sign up information is provided on this After Visit Summary.  MyChart is used to connect with patients for Virtual Visits (Telemedicine).  Patients are able to view lab/test results, encounter notes, upcoming appointments, etc.  Non-urgent messages can be sent to your provider as well.   To learn more about what you can do with MyChart, go to [email or web address].    Your next appointment:   Follow up to be determined   Other Instructions Rehydration as discussed with Dr Caryl Comes  Call us in a couple of weeks and let us know how you are doing.

## 2019-10-01 ENCOUNTER — Other Ambulatory Visit: Payer: Self-pay

## 2019-10-01 ENCOUNTER — Ambulatory Visit (INDEPENDENT_AMBULATORY_CARE_PROVIDER_SITE_OTHER): Payer: 59 | Admitting: Sports Medicine

## 2019-10-01 DIAGNOSIS — I498 Other specified cardiac arrhythmias: Secondary | ICD-10-CM | POA: Diagnosis not present

## 2019-10-01 DIAGNOSIS — Q796 Ehlers-Danlos syndrome, unspecified: Secondary | ICD-10-CM | POA: Diagnosis not present

## 2019-10-01 DIAGNOSIS — G90A Postural orthostatic tachycardia syndrome (POTS): Secondary | ICD-10-CM

## 2019-10-01 DIAGNOSIS — M5010 Cervical disc disorder with radiculopathy, unspecified cervical region: Secondary | ICD-10-CM

## 2019-10-01 NOTE — Assessment & Plan Note (Signed)
Overall her EDS symptoms are fairly stable She should continue her Pilates Add some very light upper extremity exercise Work on posture and avoiding the head forward position

## 2019-10-01 NOTE — Assessment & Plan Note (Signed)
She is on a course of salt supplementation We will see how she responds to this approach If she is having more trouble I will start her on a low-dose of Florinef twice a day

## 2019-10-01 NOTE — Patient Instructions (Signed)
Several things  Salt supplementation We will hold on florinef  Calf exercises as discussed  Butch Penny to work with you on neutral neck and head position Start light arm exercises in neutral position  Must keep up neck isometrics but be sure your neck position is right

## 2019-10-01 NOTE — Assessment & Plan Note (Signed)
I do not think she has an active disc at this time She has had some recurrent cervical spine disease Work on posture and avoiding motions that worsen the pain that radiates into her arms

## 2019-10-01 NOTE — Progress Notes (Signed)
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 Caryl Comes which was very helpful.  This helped clarify that she may need a different approach rather than calcium channel blockers or beta blockers.  He suggested for starting with salt loading.  We can add Florinef at a later date if needed.  She will monitor to see if she has any presyncopal or syncopal episodes.  Review of systems Most joints fairly stable at this time with minimal pain Sleep pattern much better if she uses 5 mg of diazepam She can skip the Ambien when she takes this without much problem  Physical examination Pleasant female in no acute distress BP 98/64   Ht 4' 10.5" (1.486 m)   Wt 105 lb (47.6 kg)   BMI 21.57 kg/m   Note that her blood pressure today continues on the low side Neck motion is hypermobile with no pain at end range of motion There is tightness in both trapezius muscles She has a slight head forward position Both shoulders are slightly rolled inward Neurological testing from C5-T1 reveals good strength

## 2019-10-02 ENCOUNTER — Other Ambulatory Visit: Payer: Self-pay

## 2019-10-02 MED ORDER — DIAZEPAM 5 MG PO TABS: ORAL_TABLET | ORAL | 3 refills | Status: DC

## 2019-10-08 ENCOUNTER — Other Ambulatory Visit: Payer: Self-pay | Admitting: Sports Medicine

## 2019-10-10 DIAGNOSIS — D2271 Melanocytic nevi of right lower limb, including hip: Secondary | ICD-10-CM | POA: Diagnosis not present

## 2019-10-10 DIAGNOSIS — D225 Melanocytic nevi of trunk: Secondary | ICD-10-CM | POA: Diagnosis not present

## 2019-10-10 DIAGNOSIS — L821 Other seborrheic keratosis: Secondary | ICD-10-CM | POA: Diagnosis not present

## 2019-10-10 DIAGNOSIS — Q825 Congenital non-neoplastic nevus: Secondary | ICD-10-CM | POA: Diagnosis not present

## 2019-10-10 DIAGNOSIS — D2261 Melanocytic nevi of right upper limb, including shoulder: Secondary | ICD-10-CM | POA: Diagnosis not present

## 2019-10-10 DIAGNOSIS — L503 Dermatographic urticaria: Secondary | ICD-10-CM | POA: Diagnosis not present

## 2019-10-10 DIAGNOSIS — Z86018 Personal history of other benign neoplasm: Secondary | ICD-10-CM | POA: Diagnosis not present

## 2019-10-10 DIAGNOSIS — L578 Other skin changes due to chronic exposure to nonionizing radiation: Secondary | ICD-10-CM | POA: Diagnosis not present

## 2019-10-21 DIAGNOSIS — L501 Idiopathic urticaria: Secondary | ICD-10-CM | POA: Diagnosis not present

## 2019-11-16 ENCOUNTER — Other Ambulatory Visit: Payer: Self-pay | Admitting: Sports Medicine

## 2019-11-18 DIAGNOSIS — L501 Idiopathic urticaria: Secondary | ICD-10-CM | POA: Diagnosis not present

## 2019-11-27 ENCOUNTER — Encounter: Payer: Self-pay | Admitting: Cardiology

## 2019-11-27 ENCOUNTER — Other Ambulatory Visit: Payer: Self-pay

## 2019-11-27 ENCOUNTER — Ambulatory Visit (INDEPENDENT_AMBULATORY_CARE_PROVIDER_SITE_OTHER): Payer: 59 | Admitting: Cardiology

## 2019-11-27 VITALS — BP 110/70 | HR 79 | Ht 58.5 in | Wt 103.0 lb

## 2019-11-27 DIAGNOSIS — Q7962 Hypermobile Ehlers-Danlos syndrome: Secondary | ICD-10-CM | POA: Diagnosis not present

## 2019-11-27 DIAGNOSIS — R000 Tachycardia, unspecified: Secondary | ICD-10-CM

## 2019-11-27 DIAGNOSIS — I498 Other specified cardiac arrhythmias: Secondary | ICD-10-CM

## 2019-11-27 DIAGNOSIS — G90A Postural orthostatic tachycardia syndrome (POTS): Secondary | ICD-10-CM

## 2019-11-27 NOTE — Progress Notes (Signed)
Cardiology Office Note    [name]:  11/27/2019   ID:  Marissa [name], DOB [birth year], MRN [id number]  PCP:  Kelton Pillar, MD  Cardiologist:  Ena Dawley, MD   Reason for visit: 4 months follow-up  History of Present Illness:  Marissa [name] is a 54 y.o. 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  The patient has a diagnosis of Ehlers-Danlos syndrome, she has tremendous orthopedic problems followed by Dr Stefanie Libel.  She has been experiencing palpitations and states that her heart rate is always high despite good hydration. She is not active in sports as she is limited with orthopedic issues, but walks most days without SOB or chest pain. No syncope.  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 She had an echocardiogram approximately 10 years ago and was told it was normal.  She has never smoked, doesn't have any children.   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 She has been going through physical therapy and using a lot of pain medication control. As a result she was experiencing dizziness and couldn't tolerate Toprol-XL that she stopped taking in January 2018. She has been experiencing a lot of palpitations and has been tachycardic almost all the time.On one occasion while in the pilate class she experienced dyspnea on exertion.  11/16/2017, the patient has developed erythema multiforme in February 2019 with severe presentation and blistering, extensive evaluation was performed including rheumatologic panel biopsies for vasculitis and the only abnormal lab was elevated CRP at 8, repeated CRP in August was 5.  The presentation was blamed on pain  medication that was discontinued, she was treated with steroids, now completely resolved.  She is dealing with a lot of problems related to Ehlers-Danlos syndrome, including hip subluxation, neck pain secondary to dislocated disc, bilateral shoulder pain and is very limited to exercises but still continues to do frequent Pilates.  She denies any chest pain shortness of breath dizziness or syncope.  She has occasional palpitations, she does not take metoprolol as there was concern that this might be causing her presentation as well.  07/10/2018, the patient is coming after 6 months she is doing well from cardiac standpoint denies any chest pain shortness of breath no palpitations.  No lower extremity edema.  She is dealing with significant neck pain secondary to disc protrusion in her cervical spine related to underlying diagnosis of Ehlers-Danlos syndrome.  08/01/2019, the patient is coming after 1 year, she has been experiencing more episodes of palpitation associated with dizziness mostly when she gets up but sometimes when she is sitting for prolonged time like after eating dinner.  This happens a few times a week.  She brings heart rate monitor that shows heart rate from upper 50s to low 100s.  She takes Toprol-XL as needed with no significant improvement.  She stays hydrated during the day, she is very active.  She has not had any falls.  She underwent lower extremity venous ultrasound for concern of DVT however it was negative.  Denies any chest pain or shortness of breath.  11/27/2019 -the patient was seen by Dr. Caryl Comes for POTS syndrome autonomic dysfunction, orthostatic  intolerance in the settings of hypermobility of Ehlers- Danlos disorder.  Dr. Caryl Comes recommended to increase her benzodiazepine in order to improve her sleep.  He encouraged use of salt and water repletion that she is very compliant with an 8 improved her symptoms significantly.  He gave her sodium tablets supplements.  She has  experimented with different products including Pedialyte for different flavors as she needs to take these long-term.  Unfortunately she does not tolerate any type of compression socks as she also has mast cell disorder that is causing development of blisters if she uses compression. She is also being followed by Dr. Glendell Docker fields [place] encouraged salt supplementation and wants to hold on Florinef at this time.  She overall feels better and she got very skilled with proper use of hydration and electrolyte and salt supplements to prevent most of her symptoms.  She has not had any falls presyncope or syncope.  Her palpitations have improved as well.  She enjoys exercise but with her underlying joint disorder she is limited with what she can do.  She denies any chest pain or shortness of breath.  No lower extremity edema orthopnea or proximal nocturnal dyspnea.   Past Medical History:  Diagnosis [name]  . Ehlers-Danlos syndrome   . History of pituitary tumor   . Ocular migraine    Past Surgical History:  Procedure Laterality [name]  . KNEE SURGERY N/A 2012  . SHOULDER SURGERY N/A 2009  . UTERINE FIBROID SURGERY N/A    Current Medications: Outpatient Medications Prior to Visit  Medication Sig Dispense Refill  . diazepam (VALIUM) 5 MG tablet 1 po bid 60 tablet 3  . Fexofenadine HCl (ALLEGRA ALLERGY PO) Allegra Allergy    . levocetirizine (XYZAL) 5 MG tablet Take 5 mg by mouth every evening.    . meloxicam (MOBIC) 15 MG tablet TAKE 1 TABLET BY MOUTH DAILY WITH FOOD FOR 7 DAYS AND THEN AS NEEDED THEREAFTER 30 tablet 2  . Multiple Vitamin (MULTIVITAMIN) capsule Take 1 capsule by mouth daily.    . norgestrel-ethinyl estradiol (CRYSELLE-28) 0.3-30 MG-MCG tablet Cryselle (28) 0.3 mg-30 mcg tablet  TAKE 1 TABLET BY MOUTH EVERY DAY CONTINUOUSLY    . traMADol (ULTRAM) 50 MG tablet Take 50 mg by mouth 4 (four) times daily as needed.    Arvid Right 150 MG/ML prefilled syringe     . zolpidem (AMBIEN) 10 MG tablet  TAKE 1/2-1 TABLETS BY MOUTH AT BEDTIME AS NEEDED 30 tablet 3  . diltiazem (CARDIZEM CD) 120 MG 24 hr capsule Take 1 capsule (120 mg total) by mouth daily. (Patient not taking: Reported on 09/25/2019) 30 capsule 3   No facility-administered medications prior to visit.     Allergies:   Macrobid [nitrofurantoin monohyd macro] and Doxepin   Social History   Socioeconomic History  . Marital status: Single    Spouse name: Not on file  . Number of children: 0  . Years of education: Masters  . Highest education level: Not on file  Occupational History    Employer: [employer]  Tobacco Use  . Smoking status: Never Smoker  . Smokeless tobacco: Never Used  Vaping Use  . Vaping Use: Never used  Substance and Sexual Activity  . Alcohol use: No  . Drug use: No  . Sexual activity: Yes    Birth control/protection: Pill  Other Topics Concern  . Not on file  Social History Narrative  . Not on file   Social Determinants of Health  Financial Resource Strain:   . Difficulty of Paying Living Expenses: Not on file  Food Insecurity:   . Worried About Charity fundraiser in the Last Year: Not on file  . Ran Out of Food in the Last Year: Not on file  Transportation Needs:   . Lack of Transportation (Medical): Not on file  . Lack of Transportation (Non-Medical): Not on file  Physical Activity:   . Days of Exercise per Week: Not on file  . Minutes of Exercise per Session: Not on file  Stress:   . Feeling of Stress : Not on file  Social Connections:   . Frequency of Communication with Friends and Family: Not on file  . Frequency of Social Gatherings with Friends and Family: Not on file  . Attends Religious Services: Not on file  . Active Member of Clubs or Organizations: Not on file  . Attends Archivist Meetings: Not on file  . Marital Status: Not on file    Family History:  The patient's family history includes Arthritis in her father and mother; Atrial fibrillation in  her mother; Diabetes in her father; Heart disease in her father; Heart failure in her father; Hypertension in her father.   ROS:   Please see the history of present illness.    ROS All other systems reviewed and are negative.  PHYSICAL EXAM:    VS:  BP 110/70   Pulse 79   Ht 4' 10.5" (1.486 m)   Wt 103 lb (46.7 kg)   SpO2 99%   BMI 21.16 kg/m    GEN: Well nourished, well developed, in no acute distress  HEENT: normal  Neck: no JVD, carotid bruits, or masses Cardiac: RRR; 1/6 systolic murmurs, rubs, or gallops,no edema  Respiratory:  clear to auscultation bilaterally, normal work of breathing GI: soft, nontender, nondistended, + BS MS: no deformity or atrophy  Skin: warm and dry, no rash Neuro:  Alert and Oriented x 3, Strength and sensation are intact Psych: euthymic mood, full affect  Wt Readings from Last 3 Encounters:  11/27/19 103 lb (46.7 kg)  10/01/19 105 lb (47.6 kg)  09/25/19 102 lb 12.8 oz (46.6 kg)    Studies/Labs Reviewed:   EKG:  EKG demonstrates NSR, EKG, unchanged from prior, personally reviewed.  Recent Labs: No results found for requested labs within last 8760 hours.   Lipid Panel    Component Value [name]/Time   CHOL 173 11/04/2015 1046   TRIG 95 11/04/2015 1046   HDL 77 11/04/2015 1046   CHOLHDL 2.2 11/04/2015 1046   LDLCALC 78 11/04/2015 1046   TTE: 11/2015 Left ventricle:  The cavity size was normal. Systolic function was normal. The estimated ejection fraction was in the range of 55% to 60%. Wall motion was normal; there were no regional wall motion abnormalities. The transmitral flow pattern was normal. The deceleration time of the early transmitral flow velocity was normal. The pulmonary vein flow pattern was normal. The tissue Doppler parameters were normal. Left ventricular diastolic function parameters were normal. ------------------------------------------------------------------- Aortic valve:   Trileaflet; normal thickness leaflets.  Mobility was not restricted.  Doppler:  Transvalvular velocity was within the normal range. There was no stenosis. There was no regurgitation. ------------------------------------------------------------------- Aorta:  Aortic root: The aortic root was normal in size. ------------------------------------------------------------------- Mitral valve:   Structurally normal valve.   Mobility was not restricted.  Doppler:  Transvalvular velocity was within the normal range. There was no evidence for stenosis. There was no regurgitation. -------------------------------------------------------------------  Left atrium:  The atrium was normal in size. ------------------------------------------------------------------- Right ventricle:  The cavity size was normal. Wall thickness was normal. Systolic function was normal.  EKG performed today 02/16/2017 shows sinus tachycardia otherwise normal EKG, heart rate is faster than previously  ASSESSMENT:    1. Inappropriate sinus tachycardia   2. POTS (postural orthostatic tachycardia syndrome)   3. Ehlers-Danlos syndrome, type 3     PLAN:  In order of problems listed above:  1. The patient carries a diagnosis of Ehlers-Danlos syndrome, a connective tissue disorder associated with valvular disease - her echo showed normal LVEF with normal size of the aortic root, no AI, mildly thickened mitral valve with no prolapse and only trivial mitral regurgitation.  This has been stable on most recent echo in July 2020.  She currently has no new symptoms with regards to her heart suggesting any changes to her heart structure, I would hold off on echocardiogram for another 2 years unless she has change in her symptoms. 2. POTS syndrome, autonomic dysfunction with orthostatic hypotension in the settings of Ehler Danlos syndrome -relatively well managed with combination of good hydration and electrolyte supplementation, if this is not sufficient in the future we can add  Florinef.    Medication Adjustments/Labs and Tests Ordered: Current medicines are reviewed at length with the patient today.  Concerns regarding medicines are outlined above.  Medication changes, Labs and Tests ordered today are listed in the Patient Instructions below. Patient Instructions  Medication Instructions:   Your physician recommends that you continue on your current medications as directed. Please refer to the Current Medication list given to you today.  *If you need a refill on your cardiac medications before your next appointment, please call your pharmacy*   Follow-Up: At [facility], you and your health needs are our priority.  As part of our continuing mission to provide you with exceptional heart care, we have created designated Provider Care Teams.  These Care Teams include your primary Cardiologist (physician) and Advanced Practice Providers (APPs -  Physician Assistants and Nurse Practitioners) who all work together to provide you with the care you need, when you need it.  We recommend signing up for the patient portal called "MyChart".  Sign up information is provided on this After Visit Summary.  MyChart is used to connect with patients for Virtual Visits (Telemedicine).  Patients are able to view lab/test results, encounter notes, upcoming appointments, etc.  Non-urgent messages can be sent to your provider as well.   To learn more about what you can do with MyChart, go to [email or web address].    Your next appointment:   6 month(s)  The format for your next appointment:   In Person  Provider:   Ena Dawley, MD       Signed, Ena Dawley, MD  11/27/2019 6:19 PM    [address], [place], [place]  [address] Phone: [phone]; Fax: [phone]

## 2019-11-27 NOTE — Patient Instructions (Signed)

## 2019-12-16 DIAGNOSIS — L501 Idiopathic urticaria: Secondary | ICD-10-CM | POA: Diagnosis not present

## 2019-12-31 ENCOUNTER — Ambulatory Visit: Payer: 59 | Admitting: Cardiology

## 2019-12-31 ENCOUNTER — Ambulatory Visit (INDEPENDENT_AMBULATORY_CARE_PROVIDER_SITE_OTHER): Payer: 59 | Admitting: Sports Medicine

## 2019-12-31 ENCOUNTER — Other Ambulatory Visit: Payer: Self-pay

## 2019-12-31 DIAGNOSIS — M25562 Pain in left knee: Secondary | ICD-10-CM

## 2019-12-31 DIAGNOSIS — G8929 Other chronic pain: Secondary | ICD-10-CM

## 2019-12-31 NOTE — Patient Instructions (Signed)
Posterior knee  Strain at insertion of biceps femoris with swelling Lateral meniscus is bulging outside of joint line Small effusion of knee  Work hamstring with no weight bearing and minimal or no resistance Extenders Flexion in 2 planes in pain free motion  Lateral meniscus would respond well to easy motion bicycle type  Intermittent wrap for 15 minutes  Icing as needed for swelling

## 2019-12-31 NOTE — Assessment & Plan Note (Signed)
Now shows sing of bulging lateral meniscus and HS strain Possibly hyperextension during pilates or other activity  Her hypermobility makes this more likely  Start with easy HS exercises Brief compression  Icing Less standing and relative rest for this knee  Re [place] in 1 mo.

## 2019-12-31 NOTE — Progress Notes (Signed)
Left knee pain  Hx of deg meniscus tear 4/24 Had pseudo-DVT syndrome Started hurting ~ Dec.1 Still painful Limiting what she can do at pilates Swelling and limited motion "Buckles some" but has done that since April - no falling  She was doing more before this happened Even some 1 leg squats  ROS Skin gets sensitive with compression Some swelling comes down leg and into ankle by day end  PE Pleasant and NAD BP 102/70   Ht 4' 10.5" (1.486 m)   Wt 102 lb (46.3 kg)   BMI 20.96 kg/m   Knee: Slightly puffy  inspection with no erythema or effusion or obvious bony abnormalities. Palpation normal with no warmth or joint line tenderness or patellar tenderness or condyle tenderness. ROM normal extension and lower leg rotation. Pain on about 130 deg of flexion and on rotation Ligaments loose solid consistent endpoints including ACL, PCL, LCL, MCL. + Mcmurray's and provocative meniscal tests. Non painful patellar compression. Patellar and quadriceps tendons unremarkable. Hamstring and quadriceps strength is normal.  Ultrasound of Left knee  Small effusion in Suprapatellar pouch Quadriceps and patellar tendons intact Medial joint line and meniscus unremarkable Bulging of lateral meniscus with localized hypoechoic swelling Hamstring insertion to fibular head shows hypoechoic change and some partial separation deep  Impression: Bulging of lateral meniscus without discreet tear;  Biceps femoris distal strain. Small effusion.  Ultrasound and interpretation by Wolfgang Phoenix. Oneida Alar, MD

## 2020-01-13 DIAGNOSIS — L501 Idiopathic urticaria: Secondary | ICD-10-CM | POA: Diagnosis not present

## 2020-01-27 ENCOUNTER — Other Ambulatory Visit: Payer: Self-pay

## 2020-01-27 MED ORDER — PREDNISONE 20 MG PO TABS: 20.0000 mg | ORAL_TABLET | Freq: Two times a day (BID) | ORAL | 0 refills | Status: DC

## 2020-01-28 ENCOUNTER — Ambulatory Visit: Payer: 59 | Admitting: Sports Medicine

## 2020-02-11 ENCOUNTER — Other Ambulatory Visit: Payer: Self-pay

## 2020-02-11 ENCOUNTER — Ambulatory Visit: Payer: Self-pay

## 2020-02-11 ENCOUNTER — Ambulatory Visit (INDEPENDENT_AMBULATORY_CARE_PROVIDER_SITE_OTHER): Payer: 59 | Admitting: Sports Medicine

## 2020-02-11 VITALS — BP 122/76 | Ht 58.5 in | Wt 100.0 lb

## 2020-02-11 DIAGNOSIS — G8929 Other chronic pain: Secondary | ICD-10-CM | POA: Diagnosis not present

## 2020-02-11 DIAGNOSIS — M25562 Pain in left knee: Secondary | ICD-10-CM

## 2020-02-11 DIAGNOSIS — M21622 Bunionette of left foot: Secondary | ICD-10-CM | POA: Diagnosis not present

## 2020-02-11 DIAGNOSIS — M21621 Bunionette of right foot: Secondary | ICD-10-CM

## 2020-02-11 NOTE — Assessment & Plan Note (Signed)
Effusion is somewhat less  Left lateral meniscus looks improved Some chronic changes over the left medial meniscus  Overall improving and we will continue conservative care  Recheck approximately 2 months

## 2020-02-11 NOTE — Progress Notes (Signed)
CC: Left knee pain/ RT foot pain  Patient's knee is much less painful She did take a 1 week course of prednisone which seemed to improve the symptoms in her knee She does occasionally stumble trying to go up steps but she is now going up the steps on her left as well as right leg She feels the swelling is less but sometimes feels tight  Review of systems No locking or giving way No redness or warmth  Right foot pain The patient has had marked swelling over her fifth MTP joints on both feet Usually the left has been more painful and recently started just a bit of swelling Until she took the prednisone her right joint was significantly swollen and somewhat red That decreased significantly She still has pain on the outside of the foot  Physical examination Pleasant thin female in no acute distress BP 122/76   Ht 4' 10.5" (1.486 m)   Wt 100 lb (45.4 kg)   BMI 20.54 kg/m   Right 5  MTP joint is nonpainful She feels slight discomfort with manipulation of the fifth metatarsal She has collapse of the transverse arch and of the lateral column  Left fifth MTP joint has very mild swelling No redness No real pain to palpation  Knee: Normal to inspection with no erythema or effusion or obvious bony abnormalities. Palpation normal with no warmth or joint line tenderness or patellar tenderness or condyle tenderness. ROM normal in flexion and extension and lower leg rotation. Ligaments with solid consistent endpoints including ACL, PCL, LCL, MCL. Negative Mcmurray's and provocative meniscal tests. Non painful patellar compression. Patellar and quadriceps tendons unremarkable. Hamstring and quadriceps strength is normal.  Ultrasound of left knee She still has a small effusion in the left knee Less bulging and hypoechoic change over the lateral meniscus Swelling around the insertion of the biceps femoris has largely resolved Medial meniscus shows some splitting but no significant  swelling  Impression: Improved ultrasound showing less biceps femoris swelling and some chronic degenerative meniscal changes  Right foot The fifth MTP joint and the base of the fifth metatarsal show no evidence of abnormal swelling The cortex of the bone is normal  Left fifth MTP joint shows slight swelling There is also a small spur  Impression: Chronic fifth MTP joint effusions with the left active today the right resolving  Ultrasound and interpretation by Peterson Ao B. Oneida Alar, MD

## 2020-02-11 NOTE — Assessment & Plan Note (Signed)
The acute swelling improved with a course of prednisone  I added some additional metatarsal padding on the right and she may need to do this in other shoes  Start using Voltaren gel over the left fifth MTP  Recheck as needed

## 2020-04-26 ENCOUNTER — Other Ambulatory Visit: Payer: Self-pay | Admitting: Sports Medicine

## 2020-04-30 ENCOUNTER — Other Ambulatory Visit: Payer: Self-pay | Admitting: Sports Medicine

## 2020-04-30 MED ORDER — DIAZEPAM 5 MG PO TABS: ORAL_TABLET | ORAL | 3 refills | Status: DC

## 2020-04-30 MED ORDER — TRAMADOL HCL 50 MG PO TABS: 50.0000 mg | ORAL_TABLET | Freq: Four times a day (QID) | ORAL | 3 refills | Status: DC | PRN

## 2020-05-05 ENCOUNTER — Encounter: Payer: Self-pay | Admitting: Sports Medicine

## 2020-05-05 ENCOUNTER — Other Ambulatory Visit: Payer: Self-pay

## 2020-05-05 ENCOUNTER — Ambulatory Visit (INDEPENDENT_AMBULATORY_CARE_PROVIDER_SITE_OTHER): Payer: Managed Care, Other (non HMO) | Admitting: Sports Medicine

## 2020-05-05 VITALS — BP 104/70 | Ht 58.5 in | Wt 102.0 lb

## 2020-05-05 DIAGNOSIS — G8929 Other chronic pain: Secondary | ICD-10-CM

## 2020-05-05 DIAGNOSIS — M25562 Pain in left knee: Secondary | ICD-10-CM | POA: Diagnosis not present

## 2020-05-05 DIAGNOSIS — M25512 Pain in left shoulder: Secondary | ICD-10-CM

## 2020-05-05 DIAGNOSIS — Q796 Ehlers-Danlos syndrome, unspecified: Secondary | ICD-10-CM

## 2020-05-05 DIAGNOSIS — M5010 Cervical disc disorder with radiculopathy, unspecified cervical region: Secondary | ICD-10-CM

## 2020-05-05 MED ORDER — PREDNISONE 20 MG PO TABS: 20.0000 mg | ORAL_TABLET | Freq: Two times a day (BID) | ORAL | 0 refills | Status: DC

## 2020-05-05 NOTE — Progress Notes (Signed)
PCP: Kelton Pillar, MD  Subjective:   HPI: Patient is a 55 y.o. female here with history of EDS and POTS who presents for evaluation of left arm/shoulder pain, and follow-up on left knee pain.  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  She reports that initially she thought this was happening again as she was having pain with neck movement that radiated into her shoulder and down her arm, however over the course of last couple weeks she has started to have pain in her anterior shoulder as well with movements of the shoulder.  She continues to have some neck pain, scapular pain, and some radiation down her arm into the lateral aspect of her elbow.  For that type of pain, she describes as a numb shooting pain and sometimes pressure-like a blood pressure cuff is inflating on her arm.  She denies any weakness.  The anterior shoulder pain seems to worsen especially with lifting things and with movement of her shoulder.  She has resorted to not moving too much in order to help calm down the pain.  She is also had some intermittent biceps muscle spasms.  Thus far, she has tried decreasing use, Valium, meloxicam, and tramadol all of which are somewhat helpful but do not resolve her pain.  Left knee Patient has been seen for this previously, she reports that she continues to have pain mostly over the medial aspect of her knee which has not improved from previous visits.  She reports intermittent swelling to the point that she has trouble walking upstairs.  It is much worse especially with deep flexion and walking upstairs.  She has been doing Pilates and physical therapy which she feels has been helpful.  No new trauma or injury since last visit.  Sciatic sxs of RT leg Sitting brings on pain into post. Thigh Radiates down to knee Hurts over ischial tuberosity and into buttocks muscles   Review of Systems:  Per HPI.   [place], medications and  smoking status reviewed.      Objective:  Physical Exam: BP 104/70   Ht 4' 10.5" (1.486 m)   Wt 102 lb (46.3 kg)   BMI 20.96 kg/m   No flowsheet data found.   Gen: awake, alert, NAD, comfortable in exam room Pulm: breathing unlabored  Left shoulder: -Inspection: no obvious deformity, atrophy, or asymmetry. No bruising. No swelling -Palpation: no TTP over AC joint, + TTP over bicipital groove. -ROM: Full ROM in abduction, flexion, internal/external rotation both passively and actively , in fact hypermobile in all planes. NV intact distally Normal scapular function observed. Special Tests:  - Impingement:  Of the positive Hawkins,  positive neers, Neg empty can sign. - Supraspinatus: Negative empty can.  5/5 strength with resisted flexion at 20 degrees - Infraspinatus/Teres Minor: 5/5 strength with ER - Subscapularis: 5/5 strength with IR - Biceps tendon:  Positive speeds  - Labrum: Negative Obriens, good stability - No painful arc and no drop arm sign  Left knee: Inspection: Bilateral knees without evidence of erythema, ecchymosis, swelling, edema.  Trace effusion present  Active ROM: Intact. 0-160d. Passive ROM: 3d passive hyperextension  Strength: 5/5 strength to resisted flexion/extension without pain  Patella: Negative patellar grind. No patellar facet tenderness. No apprehension. No proximal or distal patellar tendon tenderness to palpation. No quad tendon tenderness to palpation.  Tibia: No tibial plateau, tibial tuberosity tenderness.  Joint line: + Medial joint line tenderness Popliteal: No popliteal  tenderness to the insertional gastroc. No insertional biceps femoris, semimembranosis, semitendinosis tenderness.  McMurrays/Thessaly's: Mildly positive with Thessaly's on the medial knee Lachmans: Stable bilaterally with firm endpoint  Anterior/Posterior drawer: Stable bilaterally Varus/valgus stress at 0, 15d: Negative for pain, laxity  RT Posterior thigh TTP  directly over piriformis Piriformis stretches do not cause pain Tinel's over sciatic nerve No TTP over IT HS stretch sigh with resistance refers pain to sciatic nerve near IT   Limited US left shoulder: -Biceps tendon: Well visualized within the bicipital groove and without any abnormalities -Pectoralis: Insertion visualized and without abnormalities. -Subscapularis: Well visualized to insertion point on humerus.  No abnormalities.  Dynamic testing over the coracoid did not show signs of impingement. -Coracoid: Area around the coracoid was visualized, there is calcification within the pectoralis minor tendon at its insertion on the coracoid. -Supraspinatus: Well-visualized and without any abnormalities.  Dynamic testing did not reveal signs of impingement. -Subacromial bursa: No obvious enlargement or swelling.  Impression: Calcifications within the pectoralis minor tendon at the insertion on the coracoid process suggestive of pectoralis minor tendinopathy  Limited US L Knee -Suprapatellar pouch: Visualized in both long and short axis and with small effusion -Quadriceps tendon: Insertion onto superior pole patella visualized and without significant abnormality -Medial joint line: Medial meniscus visualized and does show some edema within the meniscus This is well circumscribed and appears like a cyst  without visualized tear, preserved joint space, MCL visualized and without abnormalities. -Lateral joint line: Lateral meniscus visualized and with interval healing from previous exam, preserved joint space, LCL visualized and without abnormalities.  Impression: -Mild joint effusion -Edema within the medial meniscus suggestive of injury without tear visualized -Interval healing of lateral meniscus  Ultrasound and interpretation by Dr. Buena Irish and Wolfgang Phoenix. Fields, MD      Assessment & Plan:  1.  Left shoulder pectoralis minor tendinopathy Patient with ultrasound findings consistent  with pectoralis minor tendinopathy.  I do suspect that some of her neck, scapular and arm pain are also due to cervical radiculopathy, however we will focus on the pectoralis minor for now given the pain is mostly located now in her anterior shoulder and is more consistent with this.  We discussed options and she would like to proceed with ECSWT.  She will follow up here for her first round next week.  2.  Left knee pain Patient with persistent pain, she does have some signs of possible medial meniscal injury which is new from previous exam.  Suspect that she may be recurrently injuring the cartilage in her knee due to the hypermobility of the joint.  We will plan to continue with compression, strengthening around the joint with supplies and PT. given this and her vital process in her shoulder, will start a prednisone 20 mg twice daily for 10 days burst.  Will follow-up in 1 month for both of these issues to check back in after ECS WT and prednisone burst.   Dagoberto Ligas, MD [facility] Fellow 05/05/2020 8:54 AM  I observed and examined the patient with the [facility] resident and agree with assessment and plan.  Note reviewed and modified by me. Ila Mcgill, MD

## 2020-05-07 MED ORDER — PREDNISONE 20 MG PO TABS: 20.0000 mg | ORAL_TABLET | Freq: Two times a day (BID) | ORAL | 0 refills | Status: DC

## 2020-05-07 NOTE — Addendum Note (Signed)
Addended by: Jolinda Croak E on: 05/07/2020 04:11 PM   Modules accepted: Orders

## 2020-05-12 ENCOUNTER — Other Ambulatory Visit: Payer: Self-pay

## 2020-05-12 ENCOUNTER — Ambulatory Visit (INDEPENDENT_AMBULATORY_CARE_PROVIDER_SITE_OTHER): Payer: Self-pay | Admitting: Family Medicine

## 2020-05-12 ENCOUNTER — Encounter: Payer: Self-pay | Admitting: Family Medicine

## 2020-05-12 DIAGNOSIS — M25512 Pain in left shoulder: Secondary | ICD-10-CM

## 2020-05-12 NOTE — Progress Notes (Signed)
ECSWT Note Marissa [name] 30-Dec-1965  Procedure: ECSWT Indications: Left pectoralis minor calcific tendinopathy  Procedure Details Consent: Risks of procedure as well as the alternatives and risks of each were explained to the (patient/caregiver).  Consent for procedure obtained. Time Out: Verified patient identification, verified procedure, site/side was marked, verified correct patient position, special equipment/implants available, medications/allergies/relevent history reviewed, required imaging and test results available.  Performed.  The area was cleaned with iodine and alcohol swabs.    The left pectoralis minor insertion onto the coracoid was targeted for Extracorporeal shockwave therapy.   Power Level: Initial power level set to 60 for the first 500 repetitions, then increase to 90 for the last 1500 Frequency: 12 Impulse/cycles: 2000 total Head size: Normal   Patient did tolerate procedure well.  We will plan to follow-up in 1 week for repeat and check back in at that time.  Dagoberto Ligas, MD Sports Medicine Fellow, [place]

## 2020-05-19 ENCOUNTER — Other Ambulatory Visit: Payer: Self-pay

## 2020-05-19 ENCOUNTER — Encounter: Payer: Self-pay | Admitting: Family Medicine

## 2020-05-19 ENCOUNTER — Ambulatory Visit (INDEPENDENT_AMBULATORY_CARE_PROVIDER_SITE_OTHER): Payer: Self-pay | Admitting: Family Medicine

## 2020-05-19 DIAGNOSIS — M25512 Pain in left shoulder: Secondary | ICD-10-CM

## 2020-05-19 NOTE — Progress Notes (Signed)
ECSWT Note Marissa [name] 1965/06/20  Procedure: ECSWT Indications: Left pectoralis minor calcific tendinopathy  Procedure Details Consent: Risks of procedure as well as the alternatives and risks of each were explained to the (patient/caregiver).  Consent for procedure obtained. Time Out: Verified patient identification, verified procedure, site/side was marked, verified correct patient position, special equipment/implants available, medications/allergies/relevent history reviewed, required imaging and test results available.  Performed.  The area was cleaned with iodine and alcohol swabs.    The left pectoralis minor insertion onto the coracoid was targeted for Extracorporeal shockwave therapy.   Power Level: Initial power level set to 60 for the first 500 repetitions, then increase to 90 for the last 1500 Frequency: 12 Impulse/cycles: 2000 total Head size: Normal   Patient did tolerate procedure well.  Plan to follow-up in 1 week for repeat, anticipate doing at least 4-5 rounds and then will check for efficacy after that.  Dagoberto Ligas, MD Sports Medicine Fellow, [place]

## 2020-05-26 ENCOUNTER — Other Ambulatory Visit: Payer: Self-pay

## 2020-05-26 ENCOUNTER — Ambulatory Visit (INDEPENDENT_AMBULATORY_CARE_PROVIDER_SITE_OTHER): Payer: Self-pay | Admitting: Family Medicine

## 2020-05-26 DIAGNOSIS — M25512 Pain in left shoulder: Secondary | ICD-10-CM

## 2020-05-26 NOTE — Progress Notes (Signed)
ECSWT Note Marissa [name] [birth year]  Procedure: ECSWT Indications: Left pectoralis minor calcific tendinopathy  Procedure Details Consent: Risks of procedure as well as the alternatives and risks of each were explained to the (patient/caregiver).  Consent for procedure obtained. Time Out: Verified patient identification, verified procedure, site/side was marked, verified correct patient position, special equipment/implants available, medications/allergies/relevent history reviewed, required imaging and test results available.  Performed.  The area was cleaned with iodine and alcohol swabs.    The left pectoralis minor insertion onto the coracoid was targeted for Extracorporeal shockwave therapy.   Power Level:Initial power level set to 60 for the first 500 repetitions, then increase to 90 for the last 1500 Frequency:12 Impulse/cycles:2000 total Head size:Normal   Patient did tolerate procedure well.  Plan to follow-up in 1 week for evaluation after 4 rounds, and consideration of further testing.  Today, patient states she has not had significant change in her pain.  Dagoberto Ligas, MD Sports Medicine Fellow, [place]

## 2020-06-02 ENCOUNTER — Ambulatory Visit (INDEPENDENT_AMBULATORY_CARE_PROVIDER_SITE_OTHER): Payer: Managed Care, Other (non HMO) | Admitting: Sports Medicine

## 2020-06-02 ENCOUNTER — Other Ambulatory Visit: Payer: Self-pay

## 2020-06-02 DIAGNOSIS — Q796 Ehlers-Danlos syndrome, unspecified: Secondary | ICD-10-CM

## 2020-06-02 DIAGNOSIS — M5010 Cervical disc disorder with radiculopathy, unspecified cervical region: Secondary | ICD-10-CM

## 2020-06-02 MED ORDER — DIAZEPAM 5 MG PO TABS: ORAL_TABLET | ORAL | 3 refills | Status: DC

## 2020-06-02 NOTE — Progress Notes (Signed)
Chief complaint left shoulder girdle pain  We had tried using ECST for her shoulder pain but after 3 sessions really had no result The prednisone Dosepak we gave her improved her knee improved her right sciatica but did not affect the left shoulder External rotation stretch of the left shoulder does relieve some of the pain The pain radiates down from the trapezius to the left shoulder blade to the anterior coracoid and into the axilla Pain is enough to wake her up at night and she has lost 5 pounds because it takes away some of her appetite  Review of systems  no weakness in the left arm No radicular symptoms to her hand She notes that her left shoulder wants to roll forward  Physical exam Pleasant thin female in some discomfort rubbing and moving her left shoulder BP 110/70   Ht 4' 10.5" (1.486 m)   Wt 102 lb (46.3 kg)   BMI 20.96 kg/m  No flowsheet data found.  Spasm of the left trapezius noted Full range of motion of the left shoulder with some increased mobility No strength loss No real palpable tenderness over the coracoid or the shoulder itself today  Neck is in a slightly forward position Full range of motion of the neck in all positions and these do not trigger pain External stretch and repositioning the neck do relieve the pain

## 2020-06-02 NOTE — Assessment & Plan Note (Signed)
While we tried treating the shoulder symptoms I believe her problems are radicular in nature from her chronic cervical spine disease  Ice massage to the left trapezius 3 times daily Postural position Consider using the soft collar Diazepam 5 mg seems to help so we will increase this to 3 times daily

## 2020-06-02 NOTE — Assessment & Plan Note (Signed)
With her chronic pain problems we are going to start her on a naltrexone protocol 1 mg/day for 2 weeks 2 mg/day for 2 weeks  Recheck in 1 month and monitor pain

## 2020-06-05 ENCOUNTER — Ambulatory Visit: Payer: Managed Care, Other (non HMO) | Admitting: Cardiology

## 2020-07-02 ENCOUNTER — Other Ambulatory Visit: Payer: Self-pay

## 2020-07-02 ENCOUNTER — Ambulatory Visit (INDEPENDENT_AMBULATORY_CARE_PROVIDER_SITE_OTHER): Payer: Managed Care, Other (non HMO) | Admitting: Sports Medicine

## 2020-07-02 DIAGNOSIS — Q796 Ehlers-Danlos syndrome, unspecified: Secondary | ICD-10-CM | POA: Diagnosis not present

## 2020-07-02 MED ORDER — AMBULATORY NON FORMULARY MEDICATION: 3 refills | Status: DC

## 2020-07-02 NOTE — Assessment & Plan Note (Signed)
I am hopeful that the naltrexone therapy is giving her a good benefit I will let her titrate the dose up to is much as 4-1/2 mg daily She will continue with Pilates She will use pain and fatigue as guidelines for her work schedule  Recheck 6 weeks

## 2020-07-02 NOTE — Progress Notes (Signed)
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  Patient had full range of motion of her shoulders Rotator cuff testing bilaterally showed good strength Left trapezius shows moderate spasm No scapular dysfunction noted on elevation of arms Some tenderness to palpation at the inferior scapular border on the left Otherwise remainder of exam is at baseline

## 2020-07-27 ENCOUNTER — Other Ambulatory Visit: Payer: Self-pay | Admitting: Obstetrics and Gynecology

## 2020-07-27 DIAGNOSIS — Z1231 Encounter for screening mammogram for malignant neoplasm of breast: Secondary | ICD-10-CM

## 2020-08-02 NOTE — Progress Notes (Deleted)
Cardiology Office Note:    Date:  08/02/2020   ID:  Marissa [name], DOB 02/06/1965, MRN [id number]  PCP:  Kelton Pillar, MD   [facility] Cardiologist:  Ena Dawley, MD (Inactive) Electrophysiologist:  Will Meredith Leeds, MD {  Referring MD: Kelton Pillar, MD    History of Present Illness:    Marissa [name] is a 55 y.o. female with a hx of Ehlers Danlos syndrome, POTs, and palpitations who was previously followed by Dr. [name] who now presents to clinic for follow-up.  Last seen in clinic on 11/2019 where she was doing well with symptoms well managed with hydration and palpitations.  Today,  Past Medical History:  Diagnosis Date   Ehlers-Danlos syndrome    History of pituitary tumor    Ocular migraine     Past Surgical History:  Procedure Laterality Date   KNEE SURGERY N/A 2012   SHOULDER SURGERY N/A 2009   UTERINE FIBROID SURGERY N/A     Current Medications: No outpatient medications have been marked as taking for the 2020/08/10 encounter (Appointment) with Freada Bergeron, MD.     Allergies:   Macrobid [nitrofurantoin monohyd macro] and Doxepin   Social History   Socioeconomic History   Marital status: Single    Spouse name: Not on file   Number of children: 0   Years of education: Masters   Highest education level: Not on file  Occupational History    Employer: [employer]  Tobacco Use   Smoking status: Never   Smokeless tobacco: Never  Vaping Use   Vaping Use: Never used  Substance and Sexual Activity   Alcohol use: No   Drug use: No   Sexual activity: Yes    Birth control/protection: Pill  Other Topics Concern   Not on file  Social History Narrative   Not on file   Social Determinants of Health   Financial Resource Strain: Not on file  Food Insecurity: Not on file  Transportation Needs: Not on file  Physical Activity: Not on file  Stress: Not on file  Social Connections: Not on file     Family  History: The patient's ***family history includes Arthritis in her father and mother; Atrial fibrillation in her mother; Diabetes in her father; Heart disease in her father; Heart failure in her father; Hypertension in her father. There is no history of Breast cancer.  ROS:   Please see the history of present illness.    *** All other systems reviewed and are negative.  EKGs/Labs/Other Studies Reviewed:    The following studies were reviewed today: TTE 08/11/18: IMPRESSIONS   1. The left ventricle has normal systolic function with an ejection  fraction of 60-65%. The cavity size was normal. Left ventricular diastolic  parameters were normal.   2. The right ventricle has normal systolic function. The cavity was  normal. There is no increase in right ventricular wall thickness.   3. Mild thickening of the mitral valve leaflet.   4. The aortic valve is tricuspid. Mild thickening of the aortic valve.   5. The aortic root is normal in size and structure.   6. Limited views of the aortic sinus and very proximal root seen and  normal in diameter.   TTE: 11/2015 Left ventricle:  The cavity size was normal. Systolic function was normal. The estimated ejection fraction was in the range of 55% to 60%. Wall motion was normal; there were no regional wall motion abnormalities. The transmitral flow pattern  was normal. The deceleration time of the early transmitral flow velocity was normal. The pulmonary vein flow pattern was normal. The tissue Doppler parameters were normal. Left ventricular diastolic function parameters were normal. ------------------------------------------------------------------- Aortic valve:   Trileaflet; normal thickness leaflets. Mobility was not restricted.  Doppler:  Transvalvular velocity was within the normal range. There was no stenosis. There was no regurgitation. ------------------------------------------------------------------- Aorta:  Aortic root: The aortic  root was normal in size. ------------------------------------------------------------------- Mitral valve:   Structurally normal valve.   Mobility was not restricted.  Doppler:  Transvalvular velocity was within the normal range. There was no evidence for stenosis. There was no regurgitation. ------------------------------------------------------------------- Left atrium:  The atrium was normal in size. ------------------------------------------------------------------- Right ventricle:  The cavity size was normal. Wall thickness was normal. Systolic function was normal.  EKG:  EKG is *** ordered today.  The ekg ordered today demonstrates ***  Recent Labs: No results found for requested labs within last 8760 hours.  Recent Lipid Panel    Component Value Date/Time   CHOL 173 11/04/2015 1046   TRIG 95 11/04/2015 1046   HDL 77 11/04/2015 1046   CHOLHDL 2.2 11/04/2015 1046   LDLCALC 78 11/04/2015 1046     Risk Assessment/Calculations:   {Does this patient have ATRIAL FIBRILLATION?:[phone]}       Physical Exam:    VS:  There were no vitals taken for this visit.    Wt Readings from Last 3 Encounters:  07/02/20 102 lb (46.3 kg)  06/02/20 102 lb (46.3 kg)  05/05/20 102 lb (46.3 kg)     GEN: *** Well nourished, well developed in no acute distress HEENT: Normal NECK: No JVD; No carotid bruits LYMPHATICS: No lymphadenopathy CARDIAC: ***RRR, no murmurs, rubs, gallops RESPIRATORY:  Clear to auscultation without rales, wheezing or rhonchi  ABDOMEN: Soft, non-tender, non-distended MUSCULOSKELETAL:  No edema; No deformity  SKIN: Warm and dry NEUROLOGIC:  Alert and oriented x 3 PSYCHIATRIC:  Normal affect   ASSESSMENT:    No diagnosis found. PLAN:    In order of problems listed above:  #Ehlers Danlos Syndrome: EF normal with normal aortic size and root. No AI or MVP. -Repeat TTE for montoring  #POTS Syndrome: Well managed with hydration, electrolyte  supplementation.   {Are you ordering a CV Procedure (e.g. stress test, cath, DCCV, TEE, etc)?   Press F2        :[id number]}    Medication Adjustments/Labs and Tests Ordered: Current medicines are reviewed at length with the patient today.  Concerns regarding medicines are outlined above.  No orders of the defined types were placed in this encounter.  No orders of the defined types were placed in this encounter.   There are no Patient Instructions on file for this visit.   Signed, Freada Bergeron, MD  08/02/2020 5:30 PM    [place]

## 2020-08-04 ENCOUNTER — Ambulatory Visit: Payer: Managed Care, Other (non HMO) | Admitting: Cardiology

## 2020-08-05 NOTE — Progress Notes (Signed)
Cardiology Office Note:    Date:  08/06/2020   ID:  Marissa [name], DOB 06-20-1965, MRN [id number]  PCP:  Kelton Pillar, MD   [facility] Cardiologist:  Ena Dawley, MD (Inactive) Electrophysiologist:  Will Meredith Leeds, MD {  Referring MD: Kelton Pillar, MD    History of Present Illness:    Marissa [name] is a 55 y.o. female with a hx of Ehlers Danlos syndrome, POTs, mast cell activation syndrome, and palpitations who was previously followed by Dr. [name] who now presents to clinic for follow-up.  Last seen in clinic on 11/2019 where she was doing well with symptoms well managed with hydration and palpitations.  Today, the patient overall feels well. Has been managing her POTs with hydration and sodium supplementation and does overall well. She denies any chest pain, SOB, LE edema, PND, or orthopnea. Did not tolerate BB in the past due to hypotension. Has suffered many joint dislocations and muscle tears over the years, but fortunately has been stable from a CV standpoint.    Family history: Father with CAD diagnosed in 94s; CHF later in life. Mother with Afib s/p PPM placement.  Past Medical History:  Diagnosis Date   Ehlers-Danlos syndrome    History of pituitary tumor    Ocular migraine     Past Surgical History:  Procedure Laterality Date   KNEE SURGERY N/A 2012   SHOULDER SURGERY N/A 2009   UTERINE FIBROID SURGERY N/A     Current Medications: Current Meds  Medication Sig   AMBULATORY NON FORMULARY MEDICATION Naltrexone 1mg /cc 4cc daily   diazepam (VALIUM) 5 MG tablet 1 po tid prn muscle spasm   Fexofenadine HCl (ALLEGRA ALLERGY PO) Allegra Allergy   levocetirizine (XYZAL) 5 MG tablet Take 5 mg by mouth every evening.   Multiple Vitamin (MULTIVITAMIN) capsule Take 1 capsule by mouth daily.   norgestrel-ethinyl estradiol (CRYSELLE-28) 0.3-30 MG-MCG tablet Cryselle (28) 0.3 mg-30 mcg tablet  TAKE 1 TABLET BY MOUTH EVERY DAY CONTINUOUSLY   XOLAIR  150 MG/ML prefilled syringe    zolpidem (AMBIEN) 10 MG tablet TAKE 1/2-1 TABLETS BY MOUTH AT BEDTIME AS NEEDED     Allergies:   Macrobid [nitrofurantoin monohyd macro] and Doxepin   Social History   Socioeconomic History   Marital status: Single    Spouse name: Not on file   Number of children: 0   Years of education: Masters   Highest education level: Not on file  Occupational History    Employer: [employer]  Tobacco Use   Smoking status: Never   Smokeless tobacco: Never  Vaping Use   Vaping Use: Never used  Substance and Sexual Activity   Alcohol use: No   Drug use: No   Sexual activity: Yes    Birth control/protection: Pill  Other Topics Concern   Not on file  Social History Narrative   Not on file   Social Determinants of Health   Financial Resource Strain: Not on file  Food Insecurity: Not on file  Transportation Needs: Not on file  Physical Activity: Not on file  Stress: Not on file  Social Connections: Not on file     Family History: The patient's family history includes Arthritis in her father and mother; Atrial fibrillation in her mother; Diabetes in her father; Heart disease in her father; Heart failure in her father; Hypertension in her father. There is no history of Breast cancer.  ROS:   Please see the history of present illness.  Review of Systems  Constitutional:  Negative for chills and fever.  HENT:  Negative for sore throat.   Eyes:  Negative for blurred vision.  Respiratory:  Negative for shortness of breath.   Cardiovascular:  Positive for palpitations. Negative for chest pain, orthopnea, claudication, leg swelling and PND.  Musculoskeletal:  Positive for joint pain and myalgias.  Neurological:  Negative for loss of consciousness.  Psychiatric/Behavioral:  Negative for substance abuse.     EKGs/Labs/Other Studies Reviewed:    The following studies were reviewed today: TTE August 11, 2018: IMPRESSIONS   1. The left ventricle has  normal systolic function with an ejection  fraction of 60-65%. The cavity size was normal. Left ventricular diastolic  parameters were normal.   2. The right ventricle has normal systolic function. The cavity was  normal. There is no increase in right ventricular wall thickness.   3. Mild thickening of the mitral valve leaflet.   4. The aortic valve is tricuspid. Mild thickening of the aortic valve.   5. The aortic root is normal in size and structure.   6. Limited views of the aortic sinus and very proximal root seen and  normal in diameter.   TTE: 11/2015 Left ventricle:  The cavity size was normal. Systolic function was normal. The estimated ejection fraction was in the range of 55% to 60%. Wall motion was normal; there were no regional wall motion abnormalities. The transmitral flow pattern was normal. The deceleration time of the early transmitral flow velocity was normal. The pulmonary vein flow pattern was normal. The tissue Doppler parameters were normal. Left ventricular diastolic function parameters were normal. ------------------------------------------------------------------- Aortic valve:   Trileaflet; normal thickness leaflets. Mobility was not restricted.  Doppler:  Transvalvular velocity was within the normal range. There was no stenosis. There was no regurgitation. ------------------------------------------------------------------- Aorta:  Aortic root: The aortic root was normal in size. ------------------------------------------------------------------- Mitral valve:   Structurally normal valve.   Mobility was not restricted.  Doppler:  Transvalvular velocity was within the normal range. There was no evidence for stenosis. There was no regurgitation. ------------------------------------------------------------------- Left atrium:  The atrium was normal in size. ------------------------------------------------------------------- Right ventricle:  The cavity size  was normal. Wall thickness was normal. Systolic function was normal.  EKG:  No new tracing  Recent Labs: No results found for requested labs within last 8760 hours.  Recent Lipid Panel    Component Value Date/Time   CHOL 173 11/04/2015 1046   TRIG 95 11/04/2015 1046   HDL 77 11/04/2015 1046   CHOLHDL 2.2 11/04/2015 1046   LDLCALC 78 11/04/2015 1046          Physical Exam:    VS:  BP 106/68   Pulse 70   Ht 4' 10.5" (1.486 m)   Wt 101 lb 9.6 oz (46.1 kg)   SpO2 99%   BMI 20.87 kg/m     Wt Readings from Last 3 Encounters:  08/06/20 101 lb 9.6 oz (46.1 kg)  07/02/20 102 lb (46.3 kg)  06/02/20 102 lb (46.3 kg)     GEN:  Well nourished, well developed in no acute distress HEENT: Normal NECK: No JVD; No carotid bruits CARDIAC: RRR, soft systolic murmur best heard at RUSB, no rubs or gallops RESPIRATORY:  Clear to auscultation without rales, wheezing or rhonchi  ABDOMEN: Soft, non-tender, non-distended MUSCULOSKELETAL:  No edema; No deformity  SKIN: Warm and dry NEUROLOGIC:  Alert and oriented x 3 PSYCHIATRIC:  Normal affect   ASSESSMENT:    1. Ehlers-Danlos  syndrome, type 3   2. POTS (postural orthostatic tachycardia syndrome)   3. Palpitations    PLAN:    In order of problems listed above:  #Ehlers Danlos Syndrome: EF normal with normal aortic size and root in 2020.  No AI or MVP. Has been managing symptoms very well with fluid and salt supplementation. No personal history of aortopathy or dissection. Will continue serial monitoring.  -Repeat TTE for montoring -Check MRA of aorta  #POTS Syndrome: Well managed with hydration, electrolyte supplementation. Has seen Dr. Caryl Comes in the past as well. Did not tolerate BB due to hypotension. -Continue fluid hydration and salt supplementation -Did not tolerate beta blocker in the past due to hypotension -If needed, could consider florinef +/- ivabradine in the future  #Palpitations: Overall well managed with  hydration and salt supplementation. HR 70s today. Did not tolerate BB in the past. -Continue hydration/salt as above -Could consider ivabradine if needed in the future  Medication Adjustments/Labs and Tests Ordered: Current medicines are reviewed at length with the patient today.  Concerns regarding medicines are outlined above.  Orders Placed This Encounter  Procedures   MR Angiogram Chest W Wo Contrast   Basic metabolic panel   ECHOCARDIOGRAM COMPLETE    No orders of the defined types were placed in this encounter.   Patient Instructions  Medication Instructions:   Your physician recommends that you continue on your current medications as directed. Please refer to the Current Medication list given to you today.  *If you need a refill on your cardiac medications before your next appointment, please call your pharmacy*   Testing/Procedures:  Your physician has requested that you have an echocardiogram. Echocardiography is a painless test that uses sound waves to create images of your heart. It provides your doctor with information about the size and shape of your heart and how well your heart's chambers and valves are working. This procedure takes approximately one hour. There are no restrictions for this procedure.   MR Angiogram Chest W Wo Contrast--to be done at the hospital   Follow-Up: At [facility], you and your health needs are our priority.  As part of our continuing mission to provide you with exceptional heart care, we have created designated Provider Care Teams.  These Care Teams include your primary Cardiologist (physician) and Advanced Practice Providers (APPs -  Physician Assistants and Nurse Practitioners) who all work together to provide you with the care you need, when you need it.  We recommend signing up for the patient portal called "MyChart".  Sign up information is provided on this After Visit Summary.  MyChart is used to connect with patients for Virtual  Visits (Telemedicine).  Patients are able to view lab/test results, encounter notes, upcoming appointments, etc.  Non-urgent messages can be sent to your provider as well.   To learn more about what you can do with MyChart, go to [email or web address].    Your next appointment:   1 year(s)  The format for your next appointment:   In Person  Provider:   Gwyndolyn Kaufman, MD     Signed, Freada Bergeron, MD  08/06/2020 10:38 AM    [place]

## 2020-08-06 ENCOUNTER — Ambulatory Visit (INDEPENDENT_AMBULATORY_CARE_PROVIDER_SITE_OTHER): Payer: Managed Care, Other (non HMO) | Admitting: Cardiology

## 2020-08-06 ENCOUNTER — Encounter: Payer: Self-pay | Admitting: Cardiology

## 2020-08-06 ENCOUNTER — Other Ambulatory Visit: Payer: Self-pay

## 2020-08-06 VITALS — BP 106/68 | HR 70 | Ht 58.5 in | Wt 101.6 lb

## 2020-08-06 DIAGNOSIS — Q7962 Hypermobile Ehlers-Danlos syndrome: Secondary | ICD-10-CM | POA: Diagnosis not present

## 2020-08-06 DIAGNOSIS — G90A Postural orthostatic tachycardia syndrome (POTS): Secondary | ICD-10-CM

## 2020-08-06 DIAGNOSIS — I498 Other specified cardiac arrhythmias: Secondary | ICD-10-CM | POA: Diagnosis not present

## 2020-08-06 DIAGNOSIS — R002 Palpitations: Secondary | ICD-10-CM | POA: Diagnosis not present

## 2020-08-06 NOTE — Patient Instructions (Addendum)
Medication Instructions:   Your physician recommends that you continue on your current medications as directed. Please refer to the Current Medication list given to you today.  *If you need a refill on your cardiac medications before your next appointment, please call your pharmacy*   Testing/Procedures:  Your physician has requested that you have an echocardiogram. Echocardiography is a painless test that uses sound waves to create images of your heart. It provides your doctor with information about the size and shape of your heart and how well your heart's chambers and valves are working. This procedure takes approximately one hour. There are no restrictions for this procedure.   MR Angiogram Chest W Wo Contrast--to be done at the hospital   Follow-Up: At [facility], you and your health needs are our priority.  As part of our continuing mission to provide you with exceptional heart care, we have created designated Provider Care Teams.  These Care Teams include your primary Cardiologist (physician) and Advanced Practice Providers (APPs -  Physician Assistants and Nurse Practitioners) who all work together to provide you with the care you need, when you need it.  We recommend signing up for the patient portal called "MyChart".  Sign up information is provided on this After Visit Summary.  MyChart is used to connect with patients for Virtual Visits (Telemedicine).  Patients are able to view lab/test results, encounter notes, upcoming appointments, etc.  Non-urgent messages can be sent to your provider as well.   To learn more about what you can do with MyChart, go to [email or web address].    Your next appointment:   1 year(s)  The format for your next appointment:   In Person  Provider:   Gwyndolyn Kaufman, MD

## 2020-08-07 ENCOUNTER — Other Ambulatory Visit: Payer: Managed Care, Other (non HMO) | Admitting: *Deleted

## 2020-08-07 DIAGNOSIS — Q7962 Hypermobile Ehlers-Danlos syndrome: Secondary | ICD-10-CM

## 2020-08-07 DIAGNOSIS — Q796 Ehlers-Danlos syndrome, unspecified: Secondary | ICD-10-CM

## 2020-08-07 LAB — BASIC METABOLIC PANEL
BUN/Creatinine Ratio: 6 — ABNORMAL LOW (ref 9–23)
BUN: 5 mg/dL — ABNORMAL LOW (ref 6–24)
CO2: 20 mmol/L (ref 20–29)
Calcium: 8.8 mg/dL (ref 8.7–10.2)
Chloride: 103 mmol/L (ref 96–106)
Creatinine, Ser: 0.82 mg/dL (ref 0.57–1.00)
Glucose: 80 mg/dL (ref 65–99)
Potassium: 3.8 mmol/L (ref 3.5–5.2)
Sodium: 139 mmol/L (ref 134–144)
eGFR: 84 mL/min/{1.73_m2} (ref >59)

## 2020-08-11 NOTE — Telephone Encounter (Signed)
The patient has been notified of the result and verbalized understanding.  All questions (if any) were answered. Nuala Alpha, LPN [id number]   Lab results sent to the pts PCP, as she requested.

## 2020-08-13 ENCOUNTER — Ambulatory Visit (INDEPENDENT_AMBULATORY_CARE_PROVIDER_SITE_OTHER): Payer: Managed Care, Other (non HMO) | Admitting: Sports Medicine

## 2020-08-13 ENCOUNTER — Other Ambulatory Visit: Payer: Self-pay

## 2020-08-13 DIAGNOSIS — Q796 Ehlers-Danlos syndrome, unspecified: Secondary | ICD-10-CM

## 2020-08-13 DIAGNOSIS — M5010 Cervical disc disorder with radiculopathy, unspecified cervical region: Secondary | ICD-10-CM | POA: Diagnosis not present

## 2020-08-13 NOTE — Patient Instructions (Signed)
Emetrol for nausea and apeptite Try small dose and build  Continue in Naltrexone 4 mg  I'm not sure about aortic screening with your type of EDS  Marissa [name] is Cardiologist now

## 2020-08-13 NOTE — Assessment & Plan Note (Signed)
Still having to favor and rest left arm  Cont. To modify activity

## 2020-08-13 NOTE — Assessment & Plan Note (Signed)
Improved pain pattern with naltrexone protocol Keep up 4 mg per day  POTS in better control with inc. Salt intake  ECHO pending

## 2020-08-13 NOTE — Progress Notes (Signed)
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

## 2020-08-19 ENCOUNTER — Ambulatory Visit (HOSPITAL_COMMUNITY): Payer: Managed Care, Other (non HMO) | Attending: Cardiovascular Disease

## 2020-08-19 ENCOUNTER — Other Ambulatory Visit: Payer: Self-pay

## 2020-08-19 DIAGNOSIS — I498 Other specified cardiac arrhythmias: Secondary | ICD-10-CM | POA: Diagnosis present

## 2020-08-19 DIAGNOSIS — Q7962 Hypermobile Ehlers-Danlos syndrome: Secondary | ICD-10-CM | POA: Diagnosis not present

## 2020-08-19 DIAGNOSIS — I361 Nonrheumatic tricuspid (valve) insufficiency: Secondary | ICD-10-CM | POA: Insufficient documentation

## 2020-08-19 LAB — ECHOCARDIOGRAM COMPLETE
Area-P 1/2: 5.27 cm2
S' Lateral: 2.1 cm

## 2020-08-21 ENCOUNTER — Telehealth: Payer: Self-pay | Admitting: Cardiology

## 2020-08-21 NOTE — Telephone Encounter (Signed)
   Pt and Mary with [employer],  Stanton Kidney said they haven't receive order for pt's MRA, order and medical necessity needs to be sent to [facility] she gave phone# [phone], she said they are the 3rd party reviewing with to approved the MRA for the pt.  Pt asked to put high priority since the MRS is scheduled on 08/26/20

## 2020-08-26 ENCOUNTER — Ambulatory Visit (HOSPITAL_COMMUNITY): Admission: RE | Admit: 2020-08-26 | Payer: Managed Care, Other (non HMO) | Source: Ambulatory Visit

## 2020-08-26 ENCOUNTER — Encounter (HOSPITAL_COMMUNITY): Payer: Self-pay

## 2020-08-26 NOTE — Telephone Encounter (Signed)
FW: FACILITY DENIAL Received: Today Werner Lean, MD  Nuala Alpha, LPN This test has been approved but not for [facility] because its inpatient.  We are not an aortopathy center of excellence at this time.  Is there another place we can get this done?         Previous Messages    ----- Message -----  From: Sharol Harness  Sent: 08/26/2020   8:14 AM EDT  To: Nuala Alpha, LPN, Freada Bergeron, MD  Subject: Kristeen Mans Morning,   [employer] approved the auth for pt's MRA, but denied the location.  Following are the details:   What's Not Approved:  At this time, we are not able to approve your request to receive the service(s) at [facility].   Please Understand: If you have or had this service at the requested facility, your plan will not pay for  it.  We are unable to approve the requested service(s) to be received at [facility].  We have made your health care provider aware of other facilities which would be approved and  where the procedure(s) may be covered and performed. Call your health care provider to request a  new or revised authorization with an approved facility.  What's Approved:  After reviewing the information we received, as well as your health plan, we determined we will cover  the following, if provided in a less-intensive setting under a new or revised authorization:  [phone] Magnetic Resonance Angiography (MRA), a special kind of picture of the blood vessels  in your chest without and with contrast (dye)  The request for the approved service(s) to be provided at [place] is  denied. Please see the "What's Not Approved" section of this letter above.   Important: In order to be covered, you must be enrolled in the plan and eligible for benefits on the  date you receive the service(s).  Why:  We reviewed information from Dr. Gwyndolyn Kaufman, your benefit  plan, and any policies and  guidelines needed to reach this decision. We found that receiving these services at [facility] is not medically necessary in your case: Based on [employer] Site of Care [facility] Coverage Policy, we cannot approve this request. Your records show that you have a  known or suspected problem for which imaging may be needed. The requested facility would have  been approved if it were needed for one of the following reasons:  -It is related to a transplant at an approved transplant site.  -Your doctor needs to use contrast dye for your study, and you have a known problem with contrast  dye.  -Imaging done at the site prior to a planned surgery or procedure is a vital part of the service.  -You need to be sedated for the imaging, and there is no freestanding site available.  -The requested site is the only one that has the size of machine you need.  -Your doctor needs to observe you while pregnant.  -The study will be done around the time of childbirth.  -You need to have a detailed picture study (magnetic resonance imaging or MRI) in an open MRI  machine that is not available at a freestanding site.  -  You have a chronic systemic (throughout your body) disease and this study needs to be performed  at a site where your prior studies were done.  -You are being treated at a [facility] for the condition you have.  -You need to have the study done immediately and there is no freestanding site available. We have  told your doctor about this. Please talk to your doctor if you have questions.  This request was reviewed by a board-certified physician: Mathews Robinsons, M.D. / Title: Associate  Medical Director / Specialties: Internal Medicine and Cardiovascular Disease.   If you are the requesting physician, and you would like to request a peer-to-peer conversation  (available for medical necessity denials only), with an [employer] physician,  please refer to the  contact information provided on the subsequent page entitled "Your Rights and Other  Important Information."  If you have questions about the information in this letter, please call Customer Service at the toll-free  number on your [facility] ID card. An associate is available to help you 24 hours aday, 7 days a week

## 2020-08-28 ENCOUNTER — Telehealth: Payer: Self-pay | Admitting: *Deleted

## 2020-08-28 NOTE — Telephone Encounter (Signed)
----- Message from Earnestine Mealing sent at 08/28/2020 12:22 PM EDT ----- Regarding: RE: FACILITY DENIAL Per [facility], they will call pt to schedule MRA ----- Message ----- From: Nuala Alpha, LPN Sent: [id number]   9:20 AM EDT To: Nuala Alpha, LPN, Earnestine Mealing, # Subject: FW: FACILITY DENIAL                            [place], can you please call [facility] and ask if they do MRA of the chest on pts and if they would cover for this pt to have this done at their facility?  Her MRA is approved but her insurance denied it being done at a [facility].  If they do them, I can change the order for you guys as needed.   Thanks a ton, [employer]   ----- Message ----- From: Werner Lean, MD Sent: 08/26/2020   9:01 AM EDT To: Nuala Alpha, LPN Subject: FW: FACILITY DENIAL                            This test has been approved but not for [facility] because its inpatient. We are not an aortopathy center of excellence at this time. Is there another place we can get this done?   ----- Message ----- From: Sharol Harness Sent: 08/26/2020   8:14 AM EDT To: Nuala Alpha, LPN, Freada Bergeron, MD Subject: Lennox Solders                                Good Morning,   [employer] approved the auth for pt's MRA, but denied the location.  Following are the details:  What's Not Approved: At this time, we are not able to approve your request to receive the service(s) at [facility].  Please Understand: If you have or had this service at the requested facility, your plan will not pay for it. We are unable to approve the requested service(s) to be received at [facility]. We have made your health care provider aware of other facilities which would be approved and where the procedure(s) may be covered and performed. Call your health care provider to request a new or revised authorization with an approved facility. What's Approved: After  reviewing the information we received, as well as your health plan, we determined we will cover the following, if provided in a less-intensive setting under a new or revised authorization: [phone] Magnetic Resonance Angiography (MRA), a special kind of picture of the blood vessels in your chest without and with contrast (dye) The request for the approved service(s) to be provided at [place] is denied. Please see the "What's Not Approved" section of this letter above.  Important: In order to be covered, you must be enrolled in the plan and eligible for benefits on the date you receive the service(s). Why: We reviewed information from Dr. Gwyndolyn Kaufman, your benefit plan, and any policies and guidelines needed to reach this decision. We found that receiving these services at [place] is not medically necessary in your case: Based on [employer] Site of Care High-[employer] Radiology Coverage Policy, we cannot approve this request. Your records show that you have a known or suspected problem for which imaging may be needed. The requested facility  would have been approved if it were needed for one of the following reasons: -It is related to a transplant at an approved transplant site. -Your doctor needs to use contrast dye for your study, and you have a known problem with contrast dye. -Imaging done at the site prior to a planned surgery or procedure is a vital part of the service. -You need to be sedated for the imaging, and there is no freestanding site available. -The requested site is the only one that has the size of machine you need. -Your doctor needs to observe you while pregnant. -The study will be done around the time of childbirth. -You need to have a detailed picture study (magnetic resonance imaging or MRI) in an open MRI machine that is not available at a freestanding site. -You have a chronic systemic (throughout your body) disease and this study needs to  be performed at a site where your prior studies were done. -You are being treated at a [facility] for the condition you have. -You need to have the study done immediately and there is no freestanding site available. We have told your doctor about this. Please talk to your doctor if you have questions. This request was reviewed by a board-certified physician: Mathews Robinsons, M.D. / Title: Associate Medical Director / Specialties: Internal Medicine and Cardiovascular Disease.  If you are the requesting physician, and you would like to request a peer-to-peer conversation (available for medical necessity denials only), with an [employer] physician, please refer to the contact information provided on the subsequent page entitled "Your Rights and Other Important Information." If you have questions about the information in this letter, please call Customer Service at the toll-free number on your [place] ID card. An associate is available to help you 24 hours a day, 7 days a week

## 2020-08-28 NOTE — Addendum Note (Signed)
Addended by: Nuala Alpha on: 08/28/2020 03:09 PM   Modules accepted: Orders

## 2020-09-04 ENCOUNTER — Telehealth: Payer: Self-pay | Admitting: *Deleted

## 2020-09-04 NOTE — Telephone Encounter (Signed)
----- Message from Earnestine Mealing sent at 08/28/2020 12:22 PM EDT ----- Regarding: RE: FACILITY DENIAL Per [place] [facility], they will call pt to schedule MRA ----- Message ----- From: Nuala Alpha, LPN Sent: [id number]   9:20 AM EDT To: Nuala Alpha, LPN, Earnestine Mealing, # Subject: FW: FACILITY DENIAL                            [place], can you please call [facility] and ask if they do MRA of the chest on pts and if they would cover for this pt to have this done at their facility?  Her MRA is approved but her insurance denied it being done at a [facility].  If they do them, I can change the order for you guys as needed.   Thanks a ton, [employer]   ----- Message ----- From: Werner Lean, MD Sent: 08/26/2020   9:01 AM EDT To: Nuala Alpha, LPN Subject: FW: FACILITY DENIAL                            This test has been approved but not for [facility] because its inpatient. We are not an aortopathy center of excellence at this time. Is there another place we can get this done?   ----- Message ----- From: Sharol Harness Sent: 08/26/2020   8:14 AM EDT To: Nuala Alpha, LPN, Freada Bergeron, MD Subject: Lennox Solders                                Good Morning,   [employer] approved the auth for pt's MRA, but denied the location.  Following are the details:  What's Not Approved: At this time, we are not able to approve your request to receive the service(s) at [facility].  Please Understand: If you have or had this service at the requested facility, your plan will not pay for it. We are unable to approve the requested service(s) to be received at [facility]. We have made your health care provider aware of other facilities which would be approved and where the procedure(s) may be covered and performed. Call your health care provider to request a new or revised authorization with an approved facility. What's Approved: After  reviewing the information we received, as well as your health plan, we determined we will cover the following, if provided in a less-intensive setting under a new or revised authorization: [id number] Magnetic Resonance Angiography (MRA), a special kind of picture of the blood vessels in your chest without and with contrast (dye) The request for the approved service(s) to be provided at [place] is denied. Please see the "What's Not Approved" section of this letter above.  Important: In order to be covered, you must be enrolled in the plan and eligible for benefits on the date you receive the service(s). Why: We reviewed information from Dr. Gwyndolyn Kaufman, your benefit plan, and any policies and guidelines needed to reach this decision. We found that receiving these services at [place] is not medically necessary in your case: Based on [employer] Site of Care High-Tech Radiology Coverage Policy, we cannot approve this request. Your records show that you have a known or suspected problem for which imaging may be needed. The requested facility  would have been approved if it were needed for one of the following reasons: -It is related to a transplant at an approved transplant site. -Your doctor needs to use contrast dye for your study, and you have a known problem with contrast dye. -Imaging done at the site prior to a planned surgery or procedure is a vital part of the service. -You need to be sedated for the imaging, and there is no freestanding site available. -The requested site is the only one that has the size of machine you need. -Your doctor needs to observe you while pregnant. -The study will be done around the time of childbirth. -You need to have a detailed picture study (magnetic resonance imaging or MRI) in an open MRI machine that is not available at a freestanding site. -You have a chronic systemic (throughout your body) disease and this study needs to  be performed at a site where your prior studies were done. -You are being treated at a [place] for the condition you have. -You need to have the study done immediately and there is no freestanding site available. We have told your doctor about this. Please talk to your doctor if you have questions. This request was reviewed by a board-certified physician: Mathews Robinsons, M.D. / Title: Associate Medical Director / Specialties: Internal Medicine and Cardiovascular Disease.  If you are the requesting physician, and you would like to request a peer-to-peer conversation (available for medical necessity denials only), with an [employer] physician, please refer to the contact information provided on the subsequent page entitled "Your Rights and Other Important Information." If you have questions about the information in this letter, please call Customer Service at the toll-free number on your [place] ID card. An associate is available to help you 24 hours a day, 7 days a week

## 2020-09-04 NOTE — Telephone Encounter (Signed)
----- Message from Earnestine Mealing sent at 08/28/2020 12:22 PM EDT ----- Regarding: RE: FACILITY DENIAL Per [place] [facility], they will call pt to schedule MRA ----- Message ----- From: Nuala Alpha, LPN Sent: [id number]   9:20 AM EDT To: Nuala Alpha, LPN, Earnestine Mealing, # Subject: FW: FACILITY DENIAL                            [place], can you please call [facility] and ask if they do MRA of the chest on pts and if they would cover for this pt to have this done at their facility?  Her MRA is approved but her insurance denied it being done at a [facility].  If they do them, I can change the order for you guys as needed.   Thanks a ton, [employer]   ----- Message ----- From: Werner Lean, MD Sent: 08/26/2020   9:01 AM EDT To: Nuala Alpha, LPN Subject: FW: FACILITY DENIAL                            This test has been approved but not for [facility] because its inpatient. We are not an aortopathy center of excellence at this time. Is there another place we can get this done?   ----- Message ----- From: Sharol Harness Sent: 08/26/2020   8:14 AM EDT To: Nuala Alpha, LPN, Freada Bergeron, MD Subject: Lennox Solders                                Good Morning,   [employer] approved the auth for pt's MRA, but denied the location.  Following are the details:  What's Not Approved: At this time, we are not able to approve your request to receive the service(s) at [facility].  Please Understand: If you have or had this service at the requested facility, your plan will not pay for it. We are unable to approve the requested service(s) to be received at [place]. We have made your health care provider aware of other facilities which would be approved and where the procedure(s) may be covered and performed. Call your health care provider to request a new or revised authorization with an approved facility. What's Approved: After  reviewing the information we received, as well as your health plan, we determined we will cover the following, if provided in a less-intensive setting under a new or revised authorization: [phone] Magnetic Resonance Angiography (MRA), a special kind of picture of the blood vessels in your chest without and with contrast (dye) The request for the approved service(s) to be provided at [place] is denied. Please see the "What's Not Approved" section of this letter above.  Important: In order to be covered, you must be enrolled in the plan and eligible for benefits on the date you receive the service(s). Why: We reviewed information from Dr. Gwyndolyn Kaufman, your benefit plan, and any policies and guidelines needed to reach this decision. We found that receiving these services at [place] is not medically necessary in your case: Based on [employer] Site of Care High-Tech Radiology Coverage Policy, we cannot approve this request. Your records show that you have a known or suspected problem for which imaging may be needed. The requested facility  would have been approved if it were needed for one of the following reasons: -It is related to a transplant at an approved transplant site. -Your doctor needs to use contrast dye for your study, and you have a known problem with contrast dye. -Imaging done at the site prior to a planned surgery or procedure is a vital part of the service. -You need to be sedated for the imaging, and there is no freestanding site available. -The requested site is the only one that has the size of machine you need. -Your doctor needs to observe you while pregnant. -The study will be done around the time of childbirth. -You need to have a detailed picture study (magnetic resonance imaging or MRI) in an open MRI machine that is not available at a freestanding site. -You have a chronic systemic (throughout your body) disease and this study needs to  be performed at a site where your prior studies were done. -You are being treated at a [place] for the condition you have. -You need to have the study done immediately and there is no freestanding site available. We have told your doctor about this. Please talk to your doctor if you have questions. This request was reviewed by a board-certified physician: Mathews Robinsons, M.D. / Title: Associate Medical Director / Specialties: Internal Medicine and Cardiovascular Disease.  If you are the requesting physician, and you would like to request a peer-to-peer conversation (available for medical necessity denials only), with an [employer] physician, please refer to the contact information provided on the subsequent page entitled "Your Rights and Other Important Information." If you have questions about the information in this letter, please call Customer Service at the toll-free number on your [place] ID card. An associate is available to help you 24 hours a day, 7 days a week

## 2020-09-04 NOTE — Telephone Encounter (Signed)
Pt is scheduled for MRA of the Chest at [place] on 09/15/20 at 0800.  Pt made aware of appt date and time by GI.

## 2020-09-04 NOTE — Telephone Encounter (Signed)
09/03/20 1st attempt epic order/LMOM//AC  [place] [facility] did attempt to reach out to the pt yesterday to schedule MRA, as indicated above.

## 2020-09-15 ENCOUNTER — Ambulatory Visit
Admission: RE | Admit: 2020-09-15 | Discharge: 2020-09-15 | Disposition: A | Discharge status: Home / Self Care | Payer: Managed Care, Other (non HMO) | Source: Ambulatory Visit | Attending: Cardiology | Admitting: Cardiology

## 2020-09-15 ENCOUNTER — Other Ambulatory Visit: Payer: Self-pay

## 2020-09-15 DIAGNOSIS — Q7962 Hypermobile Ehlers-Danlos syndrome: Secondary | ICD-10-CM

## 2020-09-15 DIAGNOSIS — Q796 Ehlers-Danlos syndrome, unspecified: Secondary | ICD-10-CM

## 2020-09-15 MED ORDER — GADOBENATE DIMEGLUMINE 529 MG/ML IV SOLN
9.0000 mL | Freq: Once | INTRAVENOUS | Status: AC | PRN
  Administered 2020-09-15: 9 mL via INTRAVENOUS

## 2020-09-23 ENCOUNTER — Other Ambulatory Visit: Payer: Self-pay

## 2020-09-23 ENCOUNTER — Ambulatory Visit
Admission: RE | Admit: 2020-09-23 | Discharge: 2020-09-23 | Disposition: A | Discharge status: Home / Self Care | Payer: Managed Care, Other (non HMO) | Source: Ambulatory Visit | Attending: Obstetrics and Gynecology | Admitting: Obstetrics and Gynecology

## 2020-09-23 DIAGNOSIS — Z1231 Encounter for screening mammogram for malignant neoplasm of breast: Secondary | ICD-10-CM

## 2020-10-08 ENCOUNTER — Ambulatory Visit (INDEPENDENT_AMBULATORY_CARE_PROVIDER_SITE_OTHER): Payer: Managed Care, Other (non HMO) | Admitting: Sports Medicine

## 2020-10-08 DIAGNOSIS — G8929 Other chronic pain: Secondary | ICD-10-CM | POA: Diagnosis not present

## 2020-10-08 DIAGNOSIS — M533 Sacrococcygeal disorders, not elsewhere classified: Secondary | ICD-10-CM | POA: Diagnosis not present

## 2020-10-08 DIAGNOSIS — M5010 Cervical disc disorder with radiculopathy, unspecified cervical region: Secondary | ICD-10-CM

## 2020-10-08 MED ORDER — AMBULATORY NON FORMULARY MEDICATION: 3 refills | Status: DC

## 2020-10-08 MED ORDER — METHYLPREDNISOLONE ACETATE 40 MG/ML IJ SUSP
40.0000 mg | Freq: Once | INTRAMUSCULAR | Status: AC
  Administered 2020-10-08: 40 mg via INTRA_ARTICULAR

## 2020-10-08 NOTE — Progress Notes (Signed)
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

## 2020-10-08 NOTE — Assessment & Plan Note (Signed)
With good motion stretching and manipulation not really helpful  Will try CSI Procedure:  Injection of RT SIJ Consent obtained and verified. Time-out conducted. Noted no overlying erythema, induration, or other signs of local infection. Skin prepped in a sterile fashion. Topical analgesic spray: Ethyl chloride. Completed without difficulty. Approach with IR of RT hip and abduction on step.  Directed into the SI cleft with no difficulty. Meds: 1 cc 40 mg soumedrol + 4 cc 1% lidocaine Advised to call if fevers/chills, erythema, induration, drainage, or persistent bleeding.  Marissa Mcgill, MD  Reck 1 month prn

## 2020-10-08 NOTE — Assessment & Plan Note (Signed)
This has responded well to low dose naltrexone Will leave dose at 4 mg/D Max is usually 4.5 but not having pain now  Reck 3 months

## 2020-12-15 ENCOUNTER — Ambulatory Visit (INDEPENDENT_AMBULATORY_CARE_PROVIDER_SITE_OTHER): Payer: Managed Care, Other (non HMO) | Admitting: Sports Medicine

## 2020-12-15 DIAGNOSIS — M5010 Cervical disc disorder with radiculopathy, unspecified cervical region: Secondary | ICD-10-CM

## 2020-12-15 DIAGNOSIS — Q796 Ehlers-Danlos syndrome, unspecified: Secondary | ICD-10-CM

## 2020-12-15 MED ORDER — HYDROXYZINE HCL 10 MG PO TABS: 10.0000 mg | ORAL_TABLET | Freq: Every day | ORAL | 3 refills | Status: DC | PRN

## 2020-12-15 MED ORDER — PREDNISONE 20 MG PO TABS: 20.0000 mg | ORAL_TABLET | Freq: Two times a day (BID) | ORAL | 0 refills | Status: DC

## 2020-12-15 MED ORDER — HYDROXYZINE HCL 10 MG PO TABS: 10.0000 mg | ORAL_TABLET | Freq: Four times a day (QID) | ORAL | 3 refills | Status: DC | PRN

## 2020-12-15 NOTE — Assessment & Plan Note (Signed)
This is 1 of several recurrences of radicular pain into her left arm I suspect she has a new disc bulge or fragment  Trial of prednisone 20 twice daily up to 10 days Avoid lifting or excess activity until the left arm and trapezius feel better  Recheck in 4 weeks

## 2020-12-15 NOTE — Progress Notes (Addendum)
CC: left neck and shoulder pain  Breakthrough pain like previous disc injury This started during the past month She has been much more active since starting on naltrexone therapy She had cut down some of the naltrexone because of persistent nausea However using this medicine she has been able to work full-time and even go to dinner and other activities that she had been avoiding  Pain started with spasm going to her trapezius shoulder blade and down her arm on the left She gets burning pain that makes it hard to sleep Neck motion does not seem to bother it  Review of systems No weakness in the hand or with hand movements No weakness in the arm although she gets pain with use or with lifting anything  Physical exam Pleasant female who looks uncomfortable BP 110/70   Ht 4' 10.5" (1.486 m)   Wt 97 lb (44 kg)   BMI 19.93 kg/m  [place] Adult Exercise 10/08/2020  Frequency of aerobic exercise (# of days/week) 2  Average time in minutes 30  Frequency of strengthening activities (# of days/week) 5   Full range of motion of the neck Pain on leaning the neck to the left Normal motion of the left shoulder Normal strength in testing shoulder and arm muscles Normal hand strength Biceps reflex is 3+ to 4+  Addendum 12/31/20 Excellent response to prednisone Flared back again on day 8 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

## 2020-12-15 NOTE — Assessment & Plan Note (Signed)
Significant improvement on naltrexone for chronic pain issues Major side effect of nausea This occurs and persists for a few hours after eating  Trial of Atarax 10 mg before meals We may experiment with how to use this medication to see if we can block some of the nausea

## 2020-12-30 ENCOUNTER — Encounter: Payer: Self-pay | Admitting: Sports Medicine

## 2021-01-07 ENCOUNTER — Other Ambulatory Visit: Payer: Self-pay | Admitting: *Deleted

## 2021-01-07 MED ORDER — HYDROXYZINE HCL 10 MG PO TABS: 10.0000 mg | ORAL_TABLET | Freq: Four times a day (QID) | ORAL | 1 refills | Status: AC | PRN

## 2021-01-19 ENCOUNTER — Ambulatory Visit: Payer: Managed Care, Other (non HMO) | Admitting: Sports Medicine

## 2021-01-21 ENCOUNTER — Ambulatory Visit (INDEPENDENT_AMBULATORY_CARE_PROVIDER_SITE_OTHER): Payer: Managed Care, Other (non HMO) | Admitting: Sports Medicine

## 2021-01-21 ENCOUNTER — Ambulatory Visit
Admission: RE | Admit: 2021-01-21 | Discharge: 2021-01-21 | Disposition: A | Discharge status: Home / Self Care | Payer: Managed Care, Other (non HMO) | Source: Ambulatory Visit | Attending: Sports Medicine | Admitting: Sports Medicine

## 2021-01-21 DIAGNOSIS — M5010 Cervical disc disorder with radiculopathy, unspecified cervical region: Secondary | ICD-10-CM | POA: Diagnosis not present

## 2021-01-21 MED ORDER — DIAZEPAM 5 MG PO TABS: ORAL_TABLET | ORAL | 3 refills | Status: DC

## 2021-01-21 MED ORDER — AMBULATORY NON FORMULARY MEDICATION: 2 refills | Status: DC

## 2021-01-21 NOTE — Progress Notes (Signed)
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 She has had intermittent radiculopathy over a number of years  She had been doing very well and had returned to work on a naltrexone program for chronic pain related to EDS This current event started almost 6 weeks ago when she awakened turned her neck and had immediate pain The pain radiates into her left shoulder and down into her left arm She has not had weakness The pain is sharper and different character than her chronic pain with EDS  We gave her prednisone for 10-day course at the start of this and she initially had significant improvement but after 8 days of prednisone the pain returned and became severe again We took her off the naltrexone so we could restart her on tramadol several times a day along with her other medications including diazepam which she uses for spasm This helped enough to allow her to work about 4 hours a day but she would have significant pain by the time this was complete Because of the lack of response she went back on naltrexone but has not had any relief  Physical exam Pleasant thin female who looks to be in significant discomfort BP 112/78    Ht 4' 10.5" (1.486 m)    Wt 97 lb (44 kg)    BMI 19.93 kg/m   She has hypermobility of her neck She has marked spasm of her left trapezius Shoulder range of motion is full and does not create any pain Neck motion and extension does increase the pain Reflexes are preserved She has good hands grip strength and overall strength in her left upper extremity

## 2021-01-21 NOTE — Assessment & Plan Note (Signed)
My concern is that she is having another disc rupture probably with a significant fragment We will change the naltrexone to twice daily to see if this gives her any additional relief Continue diazepam for muscle spasm and can try massage on her trapezius I think we need to proceed with an MRI and see if there is significant change or a ruptured disc that may require surgical intervention

## 2021-01-27 ENCOUNTER — Ambulatory Visit
Admission: RE | Admit: 2021-01-27 | Discharge: 2021-01-27 | Disposition: A | Discharge status: Home / Self Care | Payer: Managed Care, Other (non HMO) | Source: Ambulatory Visit | Attending: Sports Medicine | Admitting: Sports Medicine

## 2021-01-27 ENCOUNTER — Other Ambulatory Visit: Payer: Self-pay

## 2021-01-27 DIAGNOSIS — M5010 Cervical disc disorder with radiculopathy, unspecified cervical region: Secondary | ICD-10-CM

## 2021-01-28 ENCOUNTER — Encounter: Payer: Self-pay | Admitting: Sports Medicine

## 2021-02-23 ENCOUNTER — Ambulatory Visit (INDEPENDENT_AMBULATORY_CARE_PROVIDER_SITE_OTHER): Payer: Managed Care, Other (non HMO) | Admitting: Sports Medicine

## 2021-02-23 ENCOUNTER — Encounter: Payer: Self-pay | Admitting: Sports Medicine

## 2021-02-23 DIAGNOSIS — Q796 Ehlers-Danlos syndrome, unspecified: Secondary | ICD-10-CM | POA: Diagnosis not present

## 2021-02-23 DIAGNOSIS — M5010 Cervical disc disorder with radiculopathy, unspecified cervical region: Secondary | ICD-10-CM

## 2021-02-23 MED ORDER — DICLOFENAC SODIUM 50 MG PO TBEC: 50.0000 mg | DELAYED_RELEASE_TABLET | Freq: Every day | ORAL | 0 refills | Status: DC

## 2021-02-23 NOTE — Assessment & Plan Note (Signed)
This continues to be challenging but overall is improved on low dose Naltrexone protocol  We will continue that at a bid dose of 4 mg

## 2021-02-23 NOTE — Progress Notes (Signed)
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 Palpation is normal with no tenderness over AC joint or bicipital groove. ROM is full in all planes. Rotator cuff strength normal throughout. No signs of impingement with negative Neer and Hawkin's tests, empty can. Speeds and Yergason's tests normal. No labral pathology noted with negative Obrien's, negative clunk and good stability. Normal scapular function observed.with some fasciulation of peri-scapular MM No painful arc and no drop arm sign. No apprehension sign  Neuro testing C5- T1 stable  Increased ROM noted as before Less trapezius spasm Head forward only ~ 5 degs

## 2021-02-23 NOTE — Assessment & Plan Note (Signed)
We will try some voltaren 50 - 1 or 2 for the sharp pain episodes See if this works better than advil or aleve Tylenol not helpful  Use collar more [employer] Careful with lifting  Monitor progress in 2 to 3 mos.

## 2021-02-24 ENCOUNTER — Other Ambulatory Visit: Payer: Self-pay

## 2021-02-24 MED ORDER — DICLOFENAC SODIUM 50 MG PO TBEC: 50.0000 mg | DELAYED_RELEASE_TABLET | Freq: Two times a day (BID) | ORAL | 3 refills | Status: DC

## 2021-04-08 ENCOUNTER — Other Ambulatory Visit: Payer: Self-pay

## 2021-04-08 MED ORDER — AMBULATORY NON FORMULARY MEDICATION: 2 refills | Status: DC

## 2021-04-22 ENCOUNTER — Ambulatory Visit: Payer: Managed Care, Other (non HMO) | Admitting: Sports Medicine

## 2021-04-27 ENCOUNTER — Ambulatory Visit: Payer: BC Managed Care – PPO | Admitting: Sports Medicine

## 2021-04-27 ENCOUNTER — Ambulatory Visit (INDEPENDENT_AMBULATORY_CARE_PROVIDER_SITE_OTHER): Payer: BC Managed Care – PPO | Admitting: Sports Medicine

## 2021-04-27 DIAGNOSIS — Q796 Ehlers-Danlos syndrome, unspecified: Secondary | ICD-10-CM

## 2021-04-27 DIAGNOSIS — M5010 Cervical disc disorder with radiculopathy, unspecified cervical region: Secondary | ICD-10-CM | POA: Diagnosis not present

## 2021-04-27 DIAGNOSIS — M25551 Pain in right hip: Secondary | ICD-10-CM | POA: Diagnosis not present

## 2021-04-27 MED ORDER — DICLOFENAC SODIUM 50 MG PO TBEC: 50.0000 mg | DELAYED_RELEASE_TABLET | Freq: Two times a day (BID) | ORAL | 3 refills | Status: DC

## 2021-04-27 NOTE — Assessment & Plan Note (Signed)
On exam this seems like labral injury ?Caution to avoid IR and cross over ?

## 2021-04-27 NOTE — Assessment & Plan Note (Signed)
With her hypermobility she is having several MSK issues ?Today she has left CMC subluxation which we will treat with a thumb spica splint ? ?RT SIJ  - work on circular hip rotation about 50% of max ?

## 2021-04-27 NOTE — Assessment & Plan Note (Signed)
Finally resolving with time ? ?May still need periodic cervical collar ? ?Reck 3 mos ?

## 2021-04-27 NOTE — Progress Notes (Signed)
CC; follow up of cervical radiculpathy ?New SIJ pain on RT and CMC pain on left hand ? ?Patient with EDS with history of cervical radiculopathy ?This has finally started improving ?Very little burning or hypersensitivity over past week ? ?Left hip subluxed ` 2 wks ago ?Difficulty walking/ took 2 days to get back iin place ?This may have triggered the RT SIJ pain ? ?Recently Left CMC joint painful and feels unstable ? ?Overall pain and fatigue with EDS is better than baseline ? ?PE ?Thin F in NAD ?BP 122/78   Ht 4' 10.5" (1.486 m)   Wt 96 lb (43.5 kg)   BMI 19.72 kg/m?  ? ?Left CMC joint subluxes with light pressure ? ?Neck shows better motion and less trapezius spasm ? ?SI joint on RT shows less movement ?On motion there is a nodule on left SIJ ?Pain on IR or FADIR of RT hip ?

## 2021-04-29 ENCOUNTER — Ambulatory Visit: Payer: BC Managed Care – PPO | Admitting: Sports Medicine

## 2021-05-10 DIAGNOSIS — L501 Idiopathic urticaria: Secondary | ICD-10-CM | POA: Diagnosis not present

## 2021-05-19 DIAGNOSIS — R052 Subacute cough: Secondary | ICD-10-CM | POA: Diagnosis not present

## 2021-05-19 DIAGNOSIS — J3089 Other allergic rhinitis: Secondary | ICD-10-CM | POA: Diagnosis not present

## 2021-05-19 DIAGNOSIS — J3081 Allergic rhinitis due to animal (cat) (dog) hair and dander: Secondary | ICD-10-CM | POA: Diagnosis not present

## 2021-05-19 DIAGNOSIS — L503 Dermatographic urticaria: Secondary | ICD-10-CM | POA: Diagnosis not present

## 2021-06-08 DIAGNOSIS — Z0000 Encounter for general adult medical examination without abnormal findings: Secondary | ICD-10-CM | POA: Diagnosis not present

## 2021-06-08 DIAGNOSIS — R000 Tachycardia, unspecified: Secondary | ICD-10-CM | POA: Diagnosis not present

## 2021-06-08 DIAGNOSIS — Z13220 Encounter for screening for lipoid disorders: Secondary | ICD-10-CM | POA: Diagnosis not present

## 2021-06-30 DIAGNOSIS — L578 Other skin changes due to chronic exposure to nonionizing radiation: Secondary | ICD-10-CM | POA: Diagnosis not present

## 2021-06-30 DIAGNOSIS — D2261 Melanocytic nevi of right upper limb, including shoulder: Secondary | ICD-10-CM | POA: Diagnosis not present

## 2021-06-30 DIAGNOSIS — Q825 Congenital non-neoplastic nevus: Secondary | ICD-10-CM | POA: Diagnosis not present

## 2021-06-30 DIAGNOSIS — L821 Other seborrheic keratosis: Secondary | ICD-10-CM | POA: Diagnosis not present

## 2021-07-09 ENCOUNTER — Other Ambulatory Visit: Payer: Self-pay

## 2021-07-09 ENCOUNTER — Encounter: Payer: Self-pay | Admitting: Sports Medicine

## 2021-07-09 MED ORDER — AMBULATORY NON FORMULARY MEDICATION: 2 refills | Status: DC

## 2021-07-15 ENCOUNTER — Ambulatory Visit (INDEPENDENT_AMBULATORY_CARE_PROVIDER_SITE_OTHER): Payer: BC Managed Care – PPO | Admitting: Sports Medicine

## 2021-07-15 ENCOUNTER — Ambulatory Visit: Payer: Self-pay

## 2021-07-15 VITALS — BP 135/64 | Ht 58.5 in | Wt 96.0 lb

## 2021-07-15 DIAGNOSIS — G8929 Other chronic pain: Secondary | ICD-10-CM | POA: Diagnosis not present

## 2021-07-15 DIAGNOSIS — M79644 Pain in right finger(s): Secondary | ICD-10-CM | POA: Diagnosis not present

## 2021-07-15 DIAGNOSIS — M2669 Other specified disorders of temporomandibular joint: Secondary | ICD-10-CM

## 2021-07-15 DIAGNOSIS — M533 Sacrococcygeal disorders, not elsewhere classified: Secondary | ICD-10-CM

## 2021-07-15 DIAGNOSIS — Q796 Ehlers-Danlos syndrome, unspecified: Secondary | ICD-10-CM | POA: Diagnosis not present

## 2021-07-15 MED ORDER — METHYLPREDNISOLONE ACETATE 40 MG/ML IJ SUSP
40.0000 mg | Freq: Once | INTRAMUSCULAR | Status: AC
  Administered 2021-07-15: 40 mg via INTRA_ARTICULAR

## 2021-07-15 NOTE — Assessment & Plan Note (Addendum)
Done well with SI joint injections in the past, therefore discussed risk and benefits of proceeding with another 1.  She would like to proceed today.  A 3-1 lidocaine to methylprednisolone injection was performed by palpation guidance and she tolerated this well.  We will have her follow-up as needed for recalcitrant concerns in regards to this.  Procedure performed: Right sacroiliac joint corticosteroid injection; palpation guided  Consent obtained and verified. Time-out conducted. Noted no overlying erythema, induration, or other signs of local infection. The  right SI joint was palpated and marked. The overlying skin was prepped in a sterile fashion. Topical analgesic spray: Ethyl chloride. Joint: Right SI joint Needle: 25 gauge, 1.5 inch Completed without difficulty. Meds: 40 mg methylrprednisolone, 3 ml 1% lidocaine without epinephrine  Advised to call if fevers/chills, erythema, induration, drainage, or persistent bleeding.

## 2021-07-15 NOTE — Progress Notes (Signed)
Marissa [name] is a 56 y.o. 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 She is currently on naloxone '4mg'$  BID Overall reports that she does well with the naloxone which helps her chronic pain Her issues are that she occasionally gets different episodes of acute pain She states that recently since her last visit she has been having some left TMJ discomfort with intermittent subluxations First noticed after she awoke one morning and was unable to open her mouth She states that she will feel it lock and she will have to maneuver it to away that she can open it again Also continue to have pain in the left SI joint that is very uncomfortable States that it is difficult for her to sit, but then also has trouble with standing related to her neck She is unable to use the neck collar much for rest periods because she does not find it comfortable The most comfortable for her is when she is able to lie on the floor flat on her back She also reports that recently she was at an airport pulling luggage and had some bruising of her right fifth finger It has been a remained swollen and bruised over the last week She takes a Valium for muscle relaxant, takes a half tab in the morning, sometimes takes 1/2 tablet in the afternoon but this makes her very tired so she does not do this frequently, then takes a whole tablet at night   PMH reviewed.  ROS as above. Medications reviewed.  Exam:  BP 135/64   Ht 4' 10.5" (1.486 m)   Wt 96 lb (43.5 kg)   BMI 19.72 kg/m  Gen: Well NAD MSK:  TMJ with mild TTP and clicking on palpation and slight subluxation medially also noted, no clicking on TTP on right  Right 5th finger shows slight ecchymosis with swelling at PIP and slightly more radial than ulnar swelling TTP at radial PIP of the digit, no pain with radial directed stress at  that joint   TTP at right SI joint with reproduction of symptoms No surrounding TTP Hip ROM testing deferred given hypermobility and likely labral pathology to decrease chance of aggravating pain  Limited ultrasound of right fifth finger shows PIP on palmar, dorsal, radial, ulnar aspects.  There is some hypoechoic swelling noted over the radial aspect of the PIP joint in the region of the radial collateral ligament.  There is a very small bony avulsion noted off of this.  On dynamic stress of the radial collateral ligament, there is small amount of gapping noted and there is also a very small hyperechoic area within this that may also be consistent with a small avulsion.  Tendons visualized and intact dorsally and palmarly.  No other swelling noted. Impression: Radial collateral ligament injury at PIP with small bony avulsion.  Ultrasound and interpretation by Wolfgang Phoenix. Fields, MD and Arizona Constable, DO      Assessment and Plan: 1) Ehlers-Danlos disease Continue naloxone '4mg'$  BID.  Can continue with valium as well and consider taking a full tablet in the AM to aid with muscle relaxation for her other problems, specifically TMJ pain. She is not using the collar much for rest periods.  Will have her continue with PT Pilates as well as this is very helpful for her.  TMJ hypermobility Avoid hard foods and  try to rest joint to decrease inflammation.  Discussed speaking with her dentist about treatment options for this.  Finger pain, right Radial collateral ligament injury at the PIP with small bony avulsion.  We will have her trial a wrap versus a splint, although this would be difficult to splint her fully given that she has a history of mast cell activation syndrome.  We will have her use this for 4 weeks then follow up.  Chronic right SI joint pain Done well with SI joint injections in the past, therefore discussed risk and benefits of proceeding with another 1.  She would like to proceed  today.  A 3-1 lidocaine to methylprednisolone injection was performed by palpation guidance and she tolerated this well.  We will have her follow-up as needed for recalcitrant concerns in regards to this.  Procedure performed: Right sacroiliac joint corticosteroid injection; palpation guided  Consent obtained and verified. Time-out conducted. Noted no overlying erythema, induration, or other signs of local infection. The  right SI joint was palpated and marked. The overlying skin was prepped in a sterile fashion. Topical analgesic spray: Ethyl chloride. Joint: Right SI joint Needle: 25 gauge, 1.5 inch Completed without difficulty. Meds: 40 mg methylrprednisolone, 3 ml 1% lidocaine without epinephrine  Advised to call if fevers/chills, erythema, induration, drainage, or persistent bleeding.      Arizona Constable, D.O.  PGY-4 [facility]  07/15/2021 3:32 PM  I observed and examined the patient with the [facility] resident and agree with assessment and plan.  Note reviewed and modified by me. Ila Mcgill, MD

## 2021-07-15 NOTE — Assessment & Plan Note (Signed)
Radial collateral ligament injury at the PIP with small bony avulsion.  We will have her trial a wrap versus a splint, although this would be difficult to splint her fully given that she has a history of mast cell activation syndrome.  We will have her use this for 4 weeks then follow up.

## 2021-07-15 NOTE — Patient Instructions (Signed)
Thank you for coming to see me today. It was a pleasure. Today we talked about:   Today you received an injection with corticosteroid. This injection is usually done in response to pain and inflammation. There is some "numbing" medicine also in the shot so the injected area may be numb and feel really good for the next couple of hours. The numbing medicine usually wears off in 2-3 hours though, and then your pain level will be right back where it was before the injection.   The actually benefit from the steroid injection is usually noticed in 2-7 days. You may actually experience a small (as in 10%) INCREASE in pain in the first 24 hours---that is common.   Things to watch out for that you should contact us or a health care provider urgently would include: 1. Unusual (as in more than 10%) increase in pain 2. New fever > 101.5 3. New swelling or redness of the injected area.  4. Streaking of red lines around the area injected.   Keep the wrap or splint on your finger other than showering.  Speak with the dentist about your jaw.  Please follow-up with [place] in 1 month.  If you have any questions or concerns, please do not hesitate to call the office at [phone].  Best,   Arizona Constable, DO [place]

## 2021-07-15 NOTE — Assessment & Plan Note (Signed)
Continue naloxone '4mg'$  BID.  Can continue with valium as well and consider taking a full tablet in the AM to aid with muscle relaxation for her other problems, specifically TMJ pain. She is not using the collar much for rest periods.  Will have her continue with PT Pilates as well as this is very helpful for her.

## 2021-07-15 NOTE — Assessment & Plan Note (Addendum)
Avoid hard foods and try to rest joint to decrease inflammation.  Discussed speaking with her dentist about treatment options for this.

## 2021-07-19 ENCOUNTER — Other Ambulatory Visit: Payer: Self-pay | Admitting: Obstetrics and Gynecology

## 2021-07-19 DIAGNOSIS — Z1231 Encounter for screening mammogram for malignant neoplasm of breast: Secondary | ICD-10-CM

## 2021-07-19 DIAGNOSIS — Z01419 Encounter for gynecological examination (general) (routine) without abnormal findings: Secondary | ICD-10-CM | POA: Diagnosis not present

## 2021-07-19 DIAGNOSIS — Z6820 Body mass index (BMI) 20.0-20.9, adult: Secondary | ICD-10-CM | POA: Diagnosis not present

## 2021-08-12 ENCOUNTER — Ambulatory Visit (INDEPENDENT_AMBULATORY_CARE_PROVIDER_SITE_OTHER): Payer: BC Managed Care – PPO | Admitting: Sports Medicine

## 2021-08-12 VITALS — BP 135/55 | Ht 58.5 in

## 2021-08-12 DIAGNOSIS — M79644 Pain in right finger(s): Secondary | ICD-10-CM | POA: Diagnosis not present

## 2021-08-13 NOTE — Progress Notes (Unsigned)
  Marissa [name] - 56 y.o. female MRN [id number]  Date of birth: 03-Oct-1965    CHIEF COMPLAINT:   1 month follow-up    SUBJECTIVE:   HPI: Patient comes to clinic today to follow-up her right fifth digit injury.  About a month ago she was in an airport pulling some luggage and injured her right fifth finger.  She believes that subluxed out of place.  At the time she noted some bruising and swelling.  She had a radial collateral ligament injury at the PIP with small bony avulsion.  She was placed in a soft wrap given her difficulties with Mast cell activation syndrome in the past.  She also received an SI joint injection at her previous visit 1 month ago as well.  Today she reports she has been doing well.  She says the SI to injection helped significantly.  In regards to her right fifth digit, she reports that she has been wearing the wrap essentially 24/7.  Unfortunately she developed some redness and pain over the radial aspect of the PIP.  She now feels the finger is fairly stiff, especially with extension.   ROS:     See HPI  PERTINENT  PMH / PSH FH / / SH:  Past Medical, Surgical, Social, and Family History Reviewed & Updated in the EMR.  Pertinent findings include:  Ehlers-Danlos, mast cell activation  OBJECTIVE: BP (!) 135/55   Ht 4' 10.5" (1.486 m)   BMI 19.72 kg/m   Physical Exam:  Vital signs are reviewed.  GEN: Alert and oriented, NAD Pulm: Breathing unlabored PSY: normal mood, congruent affect  MSK: R hand -on inspection she has some postinflammatory hyperpigmentation at the radial side of the fifth PIP joint.  She is tender to palpation there.  She is limited in flexion and extension in the fifth digit due to pain.  She has no instability with valgus or varus stress to of the PIP joint.  She has 5/5 grip strength in the right hand.  She is neurovascularly intact  ASSESSMENT & PLAN:  1. Right 5th Finger Pain -We discussed with the patient today that it is time to remove  her wrap, especially given her postinflammatory hyperpigmentation that is started to develop there.  She can start gentle range of motion exercises such as working on grip strength slowly.  She is not to use the right fifth digit yet for any weightbearing activities such as typing to prevent another subluxation.  We discussed this plan with her in detail and she voiced understanding agreed to plan.  She can follow-up in about 3 months to see how she is doing.  If the fifth digit continues to cause issues, I would recommend an MRI at that time to evaluate her ligamentous structures.  Dortha Kern, MD PGY-4, Sports Medicine Fellow [place]  I observed and examined the patient with the [facility] resident and agree with assessment and plan.  Note reviewed and modified by me.  Not likely to need any additional imaging beyond [place]. Marissa Mcgill, MD

## 2021-08-27 DIAGNOSIS — J3081 Allergic rhinitis due to animal (cat) (dog) hair and dander: Secondary | ICD-10-CM | POA: Diagnosis not present

## 2021-08-27 DIAGNOSIS — J3089 Other allergic rhinitis: Secondary | ICD-10-CM | POA: Diagnosis not present

## 2021-08-27 DIAGNOSIS — L503 Dermatographic urticaria: Secondary | ICD-10-CM | POA: Diagnosis not present

## 2021-08-27 DIAGNOSIS — R052 Subacute cough: Secondary | ICD-10-CM | POA: Diagnosis not present

## 2021-09-02 ENCOUNTER — Ambulatory Visit (INDEPENDENT_AMBULATORY_CARE_PROVIDER_SITE_OTHER): Payer: BC Managed Care – PPO | Admitting: Sports Medicine

## 2021-09-02 VITALS — BP 104/68 | Ht 58.5 in

## 2021-09-02 DIAGNOSIS — M79644 Pain in right finger(s): Secondary | ICD-10-CM

## 2021-09-02 MED ORDER — METHYLPREDNISOLONE ACETATE 40 MG/ML IJ SUSP
10.0000 mg | Freq: Once | INTRAMUSCULAR | Status: AC
  Administered 2021-09-02: 10 mg via INTRA_ARTICULAR

## 2021-09-02 NOTE — Patient Instructions (Signed)
You were fifth digit has swelling in the joint We injected that joint with little bit of steroid and numbing medicine today Wear the splint for the next 2 to 3 weeks Follow-up with [place] after 2 to 3 weeks Return to clinic sooner if redness, swelling, pain gets worse or if drainage develops

## 2021-09-02 NOTE — Progress Notes (Signed)
SHLEY DOLBY - 56 y.o. female MRN [id number]  Date of birth: Jul 09, 1965    CHIEF COMPLAINT:   Right fifth digit pain    SUBJECTIVE:   HPI:  56 year old female here for follow-up of right fifth digit pain.  She reports significant pain and swelling over the right fifth PIP joint.  At our last visit, we took her out of the splint due to concern for skin irritation/discoloration at the radial aspect of the digit.  She has not been wearing the splint at all but the fifth digit is now feeling worse.  She has significant pain with bending it and the discoloration is spreading from the radial aspect toward the dorsum and ulnar aspect.  She denies any warmth or discharge coming from the joint.  Denies any injuries or cuts to the skin of the finger.  Of note, she did have a flare of her mast cell activation last week.  She has continued to take all of her antihistamine medications, inhalers, and her allergist gave her IM steroid shot on Thursday last week.  Unfortunately, none of this has improved the finger as described above.  ROS:     See HPI  PERTINENT  PMH / PSH FH / / SH:  Past Medical, Surgical, Social, and Family History Reviewed & Updated in the EMR.  Pertinent findings include:  Ehlers danlos  OBJECTIVE: BP 104/68   Ht 4' 10.5" (1.486 m)   BMI 19.72 kg/m   Physical Exam:  Vital signs are reviewed.  GEN: Alert and oriented, NAD Pulm: Breathing unlabored PSY: normal mood, congruent affect  MSK: R 5th digit -on inspection there is obvious red/purple postinflammatory hyperpigmentation from the radial aspect of the PIP extending to the dorsum of the PIP.  She is tender to palpation at the PIP on the dorsal and medial aspects.  It is cool to the touch.  She has full range of motion in flexion and extension.  Ultrasound: R 5th Digit ultrasound limited The right fifth PIP was visualized in long and short axis views.  There is hypoechoic changes at the PIP joint best visualized from the  dorsal aspect. There is some hypoechoic swelling noted over the radial aspect of the PIP joint in the region of the radial collateral ligament.  There is a very small bony avulsion noted off of this.  Tendons visualized and intact dorsally and palmarly. Impression: PIP joint effusion with small bony avulsion  ASSESSMENT & PLAN:  1. R 5th Digit Pain -Etiology of the patient's pain is difficult to ascertain.  Her ultrasound shows obvious PIP effusion but there is no clinical signs of infection.  Her effusion may be a reaction to the stretching of the joint capsule from the small bony avulsion seen on ultrasound.  We discussed treatment options and she wishes to proceed with a small corticosteroid injection into the PIP today to help reduce some of the swelling and inflammation.  See full procedure note below.  She also be placed in a splint and extension to try to promote healing.  She will follow-up in about 1 month to see how she is doing.  If no improvement I would recommend MRI of the finger at that time.  Procedure Note Procedure performed: Right fifth digit PIP injection  Consent obtained and verified. Time-out conducted. Noted no overlying erythema, induration, or other signs of local infection. The over overlying skin was prepped prepped in a sterile fashion. Topical analgesic spray: Ethyl chloride. Joint: Right 5th digit  PIP Needle: 27-gauge 5/8 inch Completed without difficulty. Meds: DepoMedrol 10 mg, lidocaine 0.33 cc  Advised to call if fevers/chills, erythema, induration, drainage, or persistent bleeding.   Dortha Kern, MD PGY-4, Sports Medicine Fellow [place]  I observed and examined the patient with the resident and agree with assessment and plan.  Note reviewed and modified by me. Ila Mcgill, MD

## 2021-09-14 ENCOUNTER — Ambulatory Visit (INDEPENDENT_AMBULATORY_CARE_PROVIDER_SITE_OTHER): Payer: BC Managed Care – PPO | Admitting: Sports Medicine

## 2021-09-14 DIAGNOSIS — L309 Dermatitis, unspecified: Secondary | ICD-10-CM | POA: Diagnosis not present

## 2021-09-14 DIAGNOSIS — M79644 Pain in right finger(s): Secondary | ICD-10-CM | POA: Diagnosis not present

## 2021-09-14 DIAGNOSIS — I781 Nevus, non-neoplastic: Secondary | ICD-10-CM | POA: Diagnosis not present

## 2021-09-14 DIAGNOSIS — L308 Other specified dermatitis: Secondary | ICD-10-CM | POA: Diagnosis not present

## 2021-09-14 MED ORDER — DIAZEPAM 5 MG PO TABS: ORAL_TABLET | ORAL | 3 refills | Status: DC

## 2021-09-14 MED ORDER — AMBULATORY NON FORMULARY MEDICATION: 2 refills | Status: DC

## 2021-09-14 NOTE — Patient Instructions (Signed)
I can't explain what is causing your suymptoms Does this relate to EDS or mast cell hypersensitivty Ask Dr. Delman Cheadle if she can help [place] figure this out  Objective findings - no extensor tendon swelling/ some increased doppler flow  Effusion at the radial side of the RT 5th PIP - not healing  Question should we use Prednisone 20 bid for 1 week but not sure what I am treating

## 2021-09-14 NOTE — Assessment & Plan Note (Signed)
It is still unclear to me whether this is a reaction related to her EDS or was truly traumatic  Splinting has not helped so we will stop that and work easy range of motion  I have asked the dermatologist to look but we may take a 1 week course of prednisone 20 twice daily  She now has forearm pain and irritation of the extensor surface  I am not sure if they are connected and if this is part of a myositis type picture with the EDS  We will recheck this after treatment

## 2021-09-14 NOTE — Progress Notes (Signed)
Chief complaint skin changes and pain in the right forearm  Patient with EDS She has been having trouble with her right fifth PIP joint where she has an effusion She seems like she may have torn the collateral ligament This has not responded well to conservative care with splinting The area stays discolored  Now she is having redness and some swelling over the extensor part of her proximal forearm No specific injury to this area She feels a significantly tender  Physical exam Pleasant female in no acute distress BP (!) 140/72   Ht 4' 10.5" (1.486 m)   Wt 96 lb (43.5 kg)   BMI 19.72 kg/m   The right fifth PIP joint still is discolored and has some medial swelling Ultrasound screening of this area did show a persistent effusion  The forearms are tender to resisted extension There is no tenderness at the lateral epicondyle The tenderness is more situated in the proximal third of the muscle bellies on the radial and ulnar side Ultrasound scan of this area does show some increased Doppler activity There is no swelling around the extensor tendon sheaths There is no sign of a tear or pocket of fluid noted

## 2021-09-16 ENCOUNTER — Ambulatory Visit: Payer: BC Managed Care – PPO | Admitting: Sports Medicine

## 2021-09-26 ENCOUNTER — Encounter: Payer: Self-pay | Admitting: Sports Medicine

## 2021-09-27 ENCOUNTER — Ambulatory Visit
Admission: RE | Admit: 2021-09-27 | Discharge: 2021-09-27 | Disposition: A | Discharge status: Home / Self Care | Payer: BC Managed Care – PPO | Source: Ambulatory Visit | Attending: Obstetrics and Gynecology | Admitting: Obstetrics and Gynecology

## 2021-09-27 DIAGNOSIS — Z1231 Encounter for screening mammogram for malignant neoplasm of breast: Secondary | ICD-10-CM | POA: Diagnosis not present

## 2021-09-28 ENCOUNTER — Encounter: Payer: Self-pay | Admitting: Sports Medicine

## 2021-09-28 DIAGNOSIS — M329 Systemic lupus erythematosus, unspecified: Secondary | ICD-10-CM | POA: Diagnosis not present

## 2021-09-28 DIAGNOSIS — L280 Lichen simplex chronicus: Secondary | ICD-10-CM | POA: Diagnosis not present

## 2021-10-20 DIAGNOSIS — Z13820 Encounter for screening for osteoporosis: Secondary | ICD-10-CM | POA: Diagnosis not present

## 2021-11-01 DIAGNOSIS — M542 Cervicalgia: Secondary | ICD-10-CM | POA: Diagnosis not present

## 2021-11-01 DIAGNOSIS — D8944 Hereditary alpha tryptasemia: Secondary | ICD-10-CM | POA: Diagnosis not present

## 2021-11-01 DIAGNOSIS — S63061D Subluxation of metacarpal (bone), proximal end of right hand, subsequent encounter: Secondary | ICD-10-CM | POA: Diagnosis not present

## 2021-11-01 DIAGNOSIS — Q7962 Hypermobile Ehlers-Danlos syndrome: Secondary | ICD-10-CM | POA: Diagnosis not present

## 2021-11-09 DIAGNOSIS — J301 Allergic rhinitis due to pollen: Secondary | ICD-10-CM | POA: Diagnosis not present

## 2021-11-09 DIAGNOSIS — J3081 Allergic rhinitis due to animal (cat) (dog) hair and dander: Secondary | ICD-10-CM | POA: Diagnosis not present

## 2021-11-09 DIAGNOSIS — L503 Dermatographic urticaria: Secondary | ICD-10-CM | POA: Diagnosis not present

## 2021-11-09 DIAGNOSIS — R052 Subacute cough: Secondary | ICD-10-CM | POA: Diagnosis not present

## 2021-11-10 DIAGNOSIS — J45998 Other asthma: Secondary | ICD-10-CM | POA: Diagnosis not present

## 2021-11-11 ENCOUNTER — Ambulatory Visit (INDEPENDENT_AMBULATORY_CARE_PROVIDER_SITE_OTHER): Payer: BC Managed Care – PPO | Admitting: Sports Medicine

## 2021-11-11 DIAGNOSIS — Q796 Ehlers-Danlos syndrome, unspecified: Secondary | ICD-10-CM | POA: Diagnosis not present

## 2021-11-11 DIAGNOSIS — J45909 Unspecified asthma, uncomplicated: Secondary | ICD-10-CM

## 2021-11-11 NOTE — Assessment & Plan Note (Signed)
This swelling at her ankle is likely from tendon subluxations from her hypermobility We will test her with a compression sleeve This does not appear inflammatory in time and no changes  The hypermobility at all of her MCP and PIP joints probably relates to the problem with the right fifth PIP healing We will continue to occasionally take this and stabilize it I do not know of any other treatments that would be beneficial

## 2021-11-11 NOTE — Progress Notes (Signed)
Chief complaint left ankle pain and continued pain in right fifth finger  Patient with Ehlers-Danlos syndrome Recently she has had more trouble with the left ankle having some swelling on the outside No recent injury  Her right fifth finger is painful and has lost some motion at the PIP joint She has seen Dr. 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

## 2021-12-01 NOTE — Progress Notes (Deleted)
Cardiology Office Note:    Date:  12/01/2021   ID:  Marissa [name], DOB 13-Mar-1965, MRN [id number]  PCP:  Kelton Pillar, MD   [place] Providers Cardiologist:  Ena Dawley, MD Electrophysiologist:  Will Meredith Leeds, MD  {  Referring MD: Kelton Pillar, MD    History of Present Illness:    Marissa [name] is a 56 y.o. female with a hx of Ehlers Danlos syndrome, POTs, mast cell activation syndrome, and palpitations who was previously followed by Dr. [name] who now presents to clinic for follow-up.  Was last seen in clinic on 07/2020 where she was doing well from a CV standpoint. Symptoms of POTs were well controlled. TTE September 03, 2020 showed normal LVEF 60-65%, normal RV, no significant valve disease.  Today, ***  Past Medical History:  Diagnosis Date   Ehlers-Danlos syndrome    History of pituitary tumor    Ocular migraine     Past Surgical History:  Procedure Laterality Date   KNEE SURGERY N/A 2012   SHOULDER SURGERY N/A 2009   UTERINE FIBROID SURGERY N/A     Current Medications: No outpatient medications have been marked as taking for the 12/03/21 encounter (Appointment) with Freada Bergeron, MD.     Allergies:   Macrobid [nitrofurantoin monohyd macro] and Doxepin   Social History   Socioeconomic History   Marital status: Single    Spouse name: Not on file   Number of children: 0   Years of education: Masters   Highest education level: Not on file  Occupational History    Employer: [employer]  Tobacco Use   Smoking status: Never   Smokeless tobacco: Never  Vaping Use   Vaping Use: Never used  Substance and Sexual Activity   Alcohol use: No   Drug use: No   Sexual activity: Yes    Birth control/protection: Pill  Other Topics Concern   Not on file  Social History Narrative   Not on file   Social Determinants of Health   Financial Resource Strain: Not on file  Food Insecurity: Not on file  Transportation Needs: Not on file   Physical Activity: Not on file  Stress: Not on file  Social Connections: Not on file     Family History: The patient's family history includes Arthritis in her father and mother; Atrial fibrillation in her mother; Diabetes in her father; Heart disease in her father; Heart failure in her father; Hypertension in her father. There is no history of Breast cancer.  ROS:   Please see the history of present illness.    Review of Systems  Constitutional:  Negative for chills and fever.  HENT:  Negative for sore throat.   Eyes:  Negative for blurred vision.  Respiratory:  Negative for shortness of breath.   Cardiovascular:  Positive for palpitations. Negative for chest pain, orthopnea, claudication, leg swelling and PND.  Musculoskeletal:  Positive for joint pain and myalgias.  Neurological:  Negative for loss of consciousness.  Psychiatric/Behavioral:  Negative for substance abuse.      EKGs/Labs/Other Studies Reviewed:    The following studies were reviewed today: TTE 03-Sep-2020: IMPRESSIONS     1. Left ventricular ejection fraction, by estimation, is 60 to 65%. The  left ventricle has normal function. The left ventricle has no regional  wall motion abnormalities. Left ventricular diastolic parameters were  normal.   2. Right ventricular systolic function is normal. The right ventricular  size is normal.   3. The mitral  valve is normal in structure. Trivial mitral valve  regurgitation. No evidence of mitral stenosis.   4. The aortic valve is normal in structure. Aortic valve regurgitation is  not visualized. No aortic stenosis is present.   5. The inferior vena cava is normal in size with greater than 50%  respiratory variability, suggesting right atrial pressure of 3 mmHg.   TTE 07/2018: IMPRESSIONS   1. The left ventricle has normal systolic function with an ejection  fraction of 60-65%. The cavity size was normal. Left ventricular diastolic  parameters were normal.   2. The  right ventricle has normal systolic function. The cavity was  normal. There is no increase in right ventricular wall thickness.   3. Mild thickening of the mitral valve leaflet.   4. The aortic valve is tricuspid. Mild thickening of the aortic valve.   5. The aortic root is normal in size and structure.   6. Limited views of the aortic sinus and very proximal root seen and  normal in diameter.   TTE: 11/2015 Left ventricle:  The cavity size was normal. Systolic function was normal. The estimated ejection fraction was in the range of 55% to 60%. Wall motion was normal; there were no regional wall motion abnormalities. The transmitral flow pattern was normal. The deceleration time of the early transmitral flow velocity was normal. The pulmonary vein flow pattern was normal. The tissue Doppler parameters were normal. Left ventricular diastolic function parameters were normal. ------------------------------------------------------------------- Aortic valve:   Trileaflet; normal thickness leaflets. Mobility was not restricted.  Doppler:  Transvalvular velocity was within the normal range. There was no stenosis. There was no regurgitation. ------------------------------------------------------------------- Aorta:  Aortic root: The aortic root was normal in size. ------------------------------------------------------------------- Mitral valve:   Structurally normal valve.   Mobility was not restricted.  Doppler:  Transvalvular velocity was within the normal range. There was no evidence for stenosis. There was no regurgitation. ------------------------------------------------------------------- Left atrium:  The atrium was normal in size. ------------------------------------------------------------------- Right ventricle:  The cavity size was normal. Wall thickness was normal. Systolic function was normal.  EKG:  No new tracing  Recent Labs: No results found for requested labs within  last 365 days.  Recent Lipid Panel    Component Value Date/Time   CHOL 173 11/04/2015 1046   TRIG 95 11/04/2015 1046   HDL 77 11/04/2015 1046   CHOLHDL 2.2 11/04/2015 1046   LDLCALC 78 11/04/2015 1046          Physical Exam:    VS:  There were no vitals taken for this visit.    Wt Readings from Last 3 Encounters:  11/11/21 96 lb (43.5 kg)  09/14/21 96 lb (43.5 kg)  07/15/21 96 lb (43.5 kg)     GEN:  Well nourished, well developed in no acute distress HEENT: Normal NECK: No JVD; No carotid bruits CARDIAC: RRR, soft systolic murmur best heard at RUSB, no rubs or gallops RESPIRATORY:  Clear to auscultation without rales, wheezing or rhonchi  ABDOMEN: Soft, non-tender, non-distended MUSCULOSKELETAL:  No edema; No deformity  SKIN: Warm and dry NEUROLOGIC:  Alert and oriented x 3 PSYCHIATRIC:  Normal affect   ASSESSMENT:    No diagnosis found.  PLAN:    In order of problems listed above:  #Ehlers Danlos Syndrome: Has been managing symptoms very well with fluid and salt supplementation. No personal history of aortopathy or dissection. TTE 08/2020 with normal LVEF, no significant valve disease, normal aortic root.  -Continue serial monitoring with TTEs  #  POTS Syndrome: Well managed with hydration, electrolyte supplementation. Has seen Dr. Caryl Comes in the past as well. Did not tolerate BB due to hypotension. -Continue fluid hydration and salt supplementation -Did not tolerate beta blocker in the past due to hypotension -If needed, could consider florinef +/- ivabradine in the future  #Palpitations: Overall well managed with hydration and salt supplementation. HR 70s today. Did not tolerate BB in the past. -Continue hydration/salt as above -Could consider ivabradine if needed in the future  Medication Adjustments/Labs and Tests Ordered: Current medicines are reviewed at length with the patient today.  Concerns regarding medicines are outlined above.  No orders of the  defined types were placed in this encounter.   No orders of the defined types were placed in this encounter.   There are no Patient Instructions on file for this visit.    Signed, Freada Bergeron, MD  12/01/2021 1:07 PM    [place]

## 2021-12-03 ENCOUNTER — Encounter: Payer: Self-pay | Admitting: Cardiology

## 2021-12-03 ENCOUNTER — Ambulatory Visit: Payer: BC Managed Care – PPO | Attending: Cardiology | Admitting: Cardiology

## 2021-12-03 VITALS — BP 116/76 | HR 79 | Ht 58.5 in | Wt 99.0 lb

## 2021-12-03 DIAGNOSIS — Q7962 Hypermobile Ehlers-Danlos syndrome: Secondary | ICD-10-CM

## 2021-12-03 DIAGNOSIS — Q796 Ehlers-Danlos syndrome, unspecified: Secondary | ICD-10-CM

## 2021-12-03 DIAGNOSIS — R002 Palpitations: Secondary | ICD-10-CM

## 2021-12-03 DIAGNOSIS — G90A Postural orthostatic tachycardia syndrome (POTS): Secondary | ICD-10-CM | POA: Diagnosis not present

## 2021-12-03 NOTE — Progress Notes (Signed)
Cardiology Office Note:    Date:  12/03/2021   ID:  [name], DOB Aug 02, 1965, MRN [id number]  PCP:  Kelton Pillar, MD   [place] Providers Cardiologist:  Ena Dawley, MD Electrophysiologist:  Will Meredith Leeds, MD  {  Referring MD: Kelton Pillar, MD    History of Present Illness:    Marissa [name] is a 56 y.o. female with a hx of Ehlers Danlos syndrome, POTs, mast cell activation syndrome, and palpitations who was previously followed by Dr. [name] who now presents to clinic for follow-up.  Was last seen in clinic on 07/2020 where she was doing well from a CV standpoint. Symptoms of POTs were well controlled. TTE 08/2020 showed normal LVEF 60-65%, normal RV, no significant valve disease.  Today, she says she is feeling okay. She states that she has been managing her POTs well with a sodium replacement. She endorses that if she does not use the sodium replacement or stands too quickly or too long, she will feel her POTs symptoms more. Even during those times, she says her symptoms are tolerable.  She reports and episode last week where she stood up and then fell to the ground. She believes this was more joint-related rather than POTs.   Additionally, she mentions that she has had a new random flare up of asthma. She has been using symbicort for about 3-4 weeks now. This has improved her symptoms.  Otherwise, she remains active without anginal symptoms.   She denies any chest pain, shortness of breath, or peripheral edema. No lightheadedness, headaches, syncope, orthopnea, or PND.  Past Medical History:  Diagnosis Date   Ehlers-Danlos syndrome    History of pituitary tumor    Ocular migraine     Past Surgical History:  Procedure Laterality Date   KNEE SURGERY N/A 2012   SHOULDER SURGERY N/A 2009   UTERINE FIBROID SURGERY N/A     Current Medications: Current Meds  Medication Sig   AMBULATORY NON FORMULARY MEDICATION Naltrexone '1mg'$ /cc  4cc twice daily    diazepam (VALIUM) 5 MG tablet 1 po tid prn muscle spasm   diclofenac (VOLTAREN) 50 MG EC tablet Take 1 tablet (50 mg total) by mouth 2 (two) times daily.   Fexofenadine HCl (ALLEGRA ALLERGY PO) Allegra Allergy   hydrOXYzine (ATARAX) 10 MG tablet Take 1 tablet (10 mg total) by mouth 4 (four) times daily as needed.   levocetirizine (XYZAL) 5 MG tablet Take 5 mg by mouth every evening.   Multiple Vitamin (MULTIVITAMIN) capsule Take 1 capsule by mouth daily.   norgestrel-ethinyl estradiol (CRYSELLE-28) 0.3-30 MG-MCG tablet Cryselle (28) 0.3 mg-30 mcg tablet  TAKE 1 TABLET BY MOUTH EVERY DAY CONTINUOUSLY   SYMBICORT 80-4.5 MCG/ACT inhaler Inhale into the lungs.   VENTOLIN HFA 108 (90 Base) MCG/ACT inhaler Inhale 2 puffs into the lungs every 4 (four) hours as needed.   XOLAIR 150 MG/ML prefilled syringe    zolpidem (AMBIEN) 10 MG tablet TAKE 1/2-1 TABLETS BY MOUTH AT BEDTIME AS NEEDED     Allergies:   Macrobid [nitrofurantoin monohyd macro] and Doxepin   Social History   Socioeconomic History   Marital status: Single    Spouse name: Not on file   Number of children: 0   Years of education: Masters   Highest education level: Not on file  Occupational History    Employer: [employer]  Tobacco Use   Smoking status: Never   Smokeless tobacco: Never  Vaping Use   Vaping Use:  Never used  Substance and Sexual Activity   Alcohol use: No   Drug use: No   Sexual activity: Yes    Birth control/protection: Pill  Other Topics Concern   Not on file  Social History Narrative   Not on file   Social Determinants of Health   Financial Resource Strain: Not on file  Food Insecurity: Not on file  Transportation Needs: Not on file  Physical Activity: Not on file  Stress: Not on file  Social Connections: Not on file     Family History: The patient's family history includes Arthritis in her father and mother; Atrial fibrillation in her mother; Diabetes in her father; Heart disease in  her father; Heart failure in her father; Hypertension in her father. There is no history of Breast cancer.  ROS:   Please see the history of present illness.    Review of Systems  Constitutional:  Negative for chills and fever.  HENT:  Negative for sore throat.   Eyes:  Negative for blurred vision.  Respiratory:  Negative for shortness of breath.   Cardiovascular:  Positive for palpitations. Negative for chest pain, orthopnea, claudication, leg swelling and PND.  Musculoskeletal:  Positive for joint pain and myalgias.  Neurological:  Negative for loss of consciousness.  Psychiatric/Behavioral:  Negative for substance abuse.      EKGs/Labs/Other Studies Reviewed:    The following studies were reviewed today:  TTE 08/2020: IMPRESSIONS    1. Left ventricular ejection fraction, by estimation, is 60 to 65%. The  left ventricle has normal function. The left ventricle has no regional  wall motion abnormalities. Left ventricular diastolic parameters were  normal.   2. Right ventricular systolic function is normal. The right ventricular  size is normal.   3. The mitral valve is normal in structure. Trivial mitral valve  regurgitation. No evidence of mitral stenosis.   4. The aortic valve is normal in structure. Aortic valve regurgitation is  not visualized. No aortic stenosis is present.   5. The inferior vena cava is normal in size with greater than 50%  respiratory variability, suggesting right atrial pressure of 3 mmHg.   TTE 07/2018: IMPRESSIONS   1. The left ventricle has normal systolic function with an ejection  fraction of 60-65%. The cavity size was normal. Left ventricular diastolic  parameters were normal.   2. The right ventricle has normal systolic function. The cavity was  normal. There is no increase in right ventricular wall thickness.   3. Mild thickening of the mitral valve leaflet.   4. The aortic valve is tricuspid. Mild thickening of the aortic valve.   5. The  aortic root is normal in size and structure.   6. Limited views of the aortic sinus and very proximal root seen and  normal in diameter.   TTE: 11/2015 Left ventricle:  The cavity size was normal. Systolic function was normal. The estimated ejection fraction was in the range of 55% to 60%. Wall motion was normal; there were no regional wall motion abnormalities. The transmitral flow pattern was normal. The deceleration time of the early transmitral flow velocity was normal. The pulmonary vein flow pattern was normal. The tissue Doppler parameters were normal. Left ventricular diastolic function parameters were normal. ------------------------------------------------------------------- Aortic valve:   Trileaflet; normal thickness leaflets. Mobility was not restricted.  Doppler:  Transvalvular velocity was within the normal range. There was no stenosis. There was no regurgitation. ------------------------------------------------------------------- Aorta:  Aortic root: The aortic root was normal in  size. ------------------------------------------------------------------- Mitral valve:   Structurally normal valve.   Mobility was not restricted.  Doppler:  Transvalvular velocity was within the normal range. There was no evidence for stenosis. There was no regurgitation. ------------------------------------------------------------------- Left atrium:  The atrium was normal in size. ------------------------------------------------------------------- Right ventricle:  The cavity size was normal. Wall thickness was normal. Systolic function was normal.  EKG: EKG is personally reviewed. 12/03/21: Sinus rhythm. Rate 79 bpm. PVC.   Recent Labs: No results found for requested labs within last 365 days.  Recent Lipid Panel    Component Value Date/Time   CHOL 173 11/04/2015 1046   TRIG 95 11/04/2015 1046   HDL 77 11/04/2015 1046   CHOLHDL 2.2 11/04/2015 1046   LDLCALC 78 11/04/2015 1046         Physical Exam:    VS:  BP 116/76   Pulse 79   Ht 4' 10.5" (1.486 m)   Wt 99 lb (44.9 kg)   SpO2 99%   BMI 20.34 kg/m     Wt Readings from Last 3 Encounters:  12/03/21 99 lb (44.9 kg)  11/11/21 96 lb (43.5 kg)  09/14/21 96 lb (43.5 kg)     GEN:  Well nourished, well developed in no acute distress HEENT: Normal NECK: No JVD; No carotid bruits CARDIAC: RRR, no murmurs RESPIRATORY:  Clear to auscultation without rales, wheezing or rhonchi  ABDOMEN: Soft, non-tender, non-distended MUSCULOSKELETAL:  No edema; No deformity  SKIN: Warm and dry NEUROLOGIC:  Alert and oriented x 3 PSYCHIATRIC:  Normal affect   ASSESSMENT:    1. Ehlers-Danlos disease   2. Ehlers-Danlos syndrome, type 3   3. POTS (postural orthostatic tachycardia syndrome)   4. Palpitations    PLAN:    In order of problems listed above:  #Ehlers Danlos Syndrome: Has been managing symptoms very well with fluid and salt supplementation. No personal history of aortopathy or dissection. TTE 08/2020 with normal LVEF, no significant valve disease, normal aortic root.  -Continue serial monitoring with TTEs  #POTS Syndrome: Well managed with hydration, electrolyte supplementation. Has seen Dr. Caryl Comes in the past as well. Did not tolerate BB due to hypotension. -Continue fluid hydration and salt supplementation -Did not tolerate beta blocker in the past due to hypotension -If needed, could consider florinef +/- ivabradine in the future but she is doing well currently  #Palpitations: Overall well managed with hydration and salt supplementation. HR 70s today. Did not tolerate BB in the past. -Continue hydration/salt as above -Could consider ivabradine if needed in the future   Follow up: 1 year.  Medication Adjustments/Labs and Tests Ordered: Current medicines are reviewed at length with the patient today.  Concerns regarding medicines are outlined above.  Orders Placed This Encounter  Procedures   EKG  12-Lead   ECHOCARDIOGRAM COMPLETE    No orders of the defined types were placed in this encounter.   Patient Instructions  Medication Instructions:   Your physician recommends that you continue on your current medications as directed. Please refer to the Current Medication list given to you today.  *If you need a refill on your cardiac medications before your next appointment, please call your pharmacy*   Testing/Procedures:  Your physician has requested that you have an echocardiogram. Echocardiography is a painless test that uses sound waves to create images of your heart. It provides your doctor with information about the size and shape of your heart and how well your heart's chambers and valves are working. This procedure takes approximately  one hour. There are no restrictions for this procedure. Please do NOT wear cologne, perfume, aftershave, or lotions (deodorant is allowed). Please arrive 15 minutes prior to your appointment time.    Follow-Up: At [facility], you and your health needs are our priority.  As part of our continuing mission to provide you with exceptional heart care, we have created designated Provider Care Teams.  These Care Teams include your primary Cardiologist (physician) and Advanced Practice Providers (APPs -  Physician Assistants and Nurse Practitioners) who all work together to provide you with the care you need, when you need it.  We recommend signing up for the patient portal called "MyChart".  Sign up information is provided on this After Visit Summary.  MyChart is used to connect with patients for Virtual Visits (Telemedicine).  Patients are able to view lab/test results, encounter notes, upcoming appointments, etc.  Non-urgent messages can be sent to your provider as well.   To learn more about what you can do with MyChart, go to [email or web address].    Your next appointment:   1 year(s)  The format for your next appointment:   In  Person  Provider:   Dr. Johney Frame   Important Information About Sugar         I,Breanna Adamick,acting as a scribe for Freada Bergeron, MD.,have documented all relevant documentation on the behalf of Freada Bergeron, MD,as directed by  Freada Bergeron, MD while in the presence of Freada Bergeron, MD.  I, Freada Bergeron, MD, have reviewed all documentation for this visit. The documentation on 12/03/21 for the exam, diagnosis, procedures, and orders are all accurate and complete.    Signed, Freada Bergeron, MD  12/03/2021 8:39 AM    [place]

## 2021-12-03 NOTE — Patient Instructions (Signed)
Medication Instructions:   Your physician recommends that you continue on your current medications as directed. Please refer to the Current Medication list given to you today.  *If you need a refill on your cardiac medications before your next appointment, please call your pharmacy*   Testing/Procedures:  Your physician has requested that you have an echocardiogram. Echocardiography is a painless test that uses sound waves to create images of your heart. It provides your doctor with information about the size and shape of your heart and how well your heart's chambers and valves are working. This procedure takes approximately one hour. There are no restrictions for this procedure. Please do NOT wear cologne, perfume, aftershave, or lotions (deodorant is allowed). Please arrive 15 minutes prior to your appointment time.    Follow-Up: At [facility], you and your health needs are our priority.  As part of our continuing mission to provide you with exceptional heart care, we have created designated Provider Care Teams.  These Care Teams include your primary Cardiologist (physician) and Advanced Practice Providers (APPs -  Physician Assistants and Nurse Practitioners) who all work together to provide you with the care you need, when you need it.  We recommend signing up for the patient portal called "MyChart".  Sign up information is provided on this After Visit Summary.  MyChart is used to connect with patients for Virtual Visits (Telemedicine).  Patients are able to view lab/test results, encounter notes, upcoming appointments, etc.  Non-urgent messages can be sent to your provider as well.   To learn more about what you can do with MyChart, go to [email or web address].    Your next appointment:   1 year(s)  The format for your next appointment:   In Person  Provider:   Dr. Johney Frame   Important Information About Sugar

## 2021-12-21 MED ORDER — XOLAIR 150 MG/ML SUBCUTANEOUS SYRINGE
SUBCUTANEOUS | 5 refills | 28 days
Start: 2021-12-21 — End: ?

## 2021-12-22 DIAGNOSIS — L501 Idiopathic urticaria: Principal | ICD-10-CM

## 2021-12-23 MED ORDER — XOLAIR 150 MG/ML SUBCUTANEOUS SYRINGE
SUBCUTANEOUS | 5 refills | 28 days
Start: 2021-12-23 — End: ?

## 2021-12-24 NOTE — Unmapped (Signed)
[facility] Specialty Medication Onboarding    Specialty Medication: Xolair  Prior Authorization: Not Required   Financial Assistance: Yes - copay card approved as secondary   Final Copay/Day Supply: $0 / 28    Insurance Restrictions: None     Notes to Pharmacist: None    The triage team has completed the benefits investigation and has determined that the patient is able to fill this medication at [facility]. Please contact the patient to complete the onboarding or follow up with the prescribing physician as needed.

## 2021-12-27 NOTE — Unmapped (Signed)
[facility]   Patient Onboarding/Medication Counseling    Natasha Copeland is a 56 y.o. 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  I am speaking to the patient.    Was a Nurse, learning disability used for this call? No    Verified patient's date of birth / HIPAA.    Specialty medication(s) to be sent: Inflammatory Disorders: Xolair      Non-specialty medications/supplies to be sent: n/a      Medications not needed at this time: n/a       The patient declined counseling on medication administration, missed dose instructions, goals of therapy, side effects and monitoring parameters, warnings and precautions, drug/food interactions, and storage, handling precautions, and disposal because they have taken the medication previously. The information in the declined sections below are for informational purposes only and was not discussed with patient.       Xolair Syringes (omalizumab)    Medication & Administration     Dosage: Inject 300 mg under the skin every 28 days    Administration:     Vials: Must be administered as a subcutaneous injection in the abdomen or thighs by a healthcare professional. Patient will be observed after receiving first 3 injections in clinic before moving to home administration.    Prefilled Syringe:  Home administration screening questions:    Has patient ever had an anaphylaxis reaction to Xolair or other agents, such as foods, drugs, biologics, etc? No    Has patient received at least 3 doses in clinic under the supervision of a healthcare proefssional? Yes    Does the patient have an Epi-pen to use for possible anaphylaxis reaction? Yes    Injection administration:   Gather all supplies needed for injection on a clean, flat working surface: medication syringe(s) removed from packaging, alcohol swab, sharps container, etc.  Look at the medication label - look for correct medication, correct dose, and check the expiration date  Look at the medication - the liquid in the syringe should appear clear and colorless to slightly yellow, you may see a few white particles  Lay the syringe on a flat surface and allow it to warm up to room temperature for at least 15-30 minutes  Select injection site - you can use the front of your thigh or your belly (but not the area 2 inches around your belly button); if someone else is giving you the injection you can also use your upper arm in the skin covering your triceps muscle or in the buttocks  Prepare injection site - wash your hands and clean the skin at the injection site with an alcohol swab and let it air dry, do not touch the injection site again before the injection  Pull off the needle safety cap, do not remove until immediately prior to injection  Pinch the skin - with your hand not holding the syringe pinch up a fold of skin at the injection site using your forefinger and thumb  Insert the needle into the fold of skin at about a 45 degree angle - it's best to use a quick dart-like motion  Push the plunger down slowly as far as it will go until the syringe is empty, if the plunger is not fully depressed the needle shield will not extend to cover the needle when it is removed, hold the syringe in place for a full 5 seconds  Check that the syringe is empty and keep pressing down on the  plunger while you pull the needle out at the same angle as inserted; after the needle is removed completely from the skin, release the plunger allowing the needle shield to activate and cover the used needle  Dispose of the used syringe immediately in your sharps disposal container, do not attempt to recap the needle prior to disposing  If you see any blood at the injection site, press a cotton ball or gauze on the site and maintain pressure until the bleeding stops, do not rub the injection site    Adherence/Missed dose instructions: If you miss a dose take as soon as you remember.  Resume the correct dosing schedule. Goals of Therapy     To treat asthma and or chronic idiopathic urticaria    Side Effects & Monitoring Parameters     Commonly reported side effects  Headache  Nausea, vomiting   Injection site reaction  Loss of strength and energy  Common cold symptoms, sore throat, stuffy nose  Ear pain  Painful extremities     The following side effects should be reported to the provider:  Signs of cerebrovascular disease (change in strength on one side is greater than the other, trouble speaking or thinking, change in balance or vision changes)  Signs of DVT (swelling, warmth, numbness, change in color or pain in extremities)  Signs of anaphylaxis (wheezing, chest tightness, swelling of face, lips, tongue or throat)    Monitoring Parameters:   Anaphylactic/hypersensitivity reactions (observe patients for 2 hours after the first 3 injections and 30 minutes after subsequent injections or in accordance with individual institution policies and procedures);   Baseline serum total IgE; FEV1, peak flow, and/or other pulmonary function tests  Monitor for signs of infection    Contraindications, Warnings, & Precautions   [place] Boxed Warning]: Anaphylaxis, including delayed-onset anaphylaxis, has been reported following administration; anaphylaxis may present as bronchospasm, hypotension, syncope, urticaria, and/or angioedema of the throat or tongue. Anaphylaxis has occurred after the first dose and in some cases >1 year after initiation of regular treatment. Due to the risk, patients should be observed closely for an appropriate time period after administration and should receive treatment only under direct medical supervision. Healthcare providers should be prepared to administer appropriate therapy for managing potentially life-threatening anaphylaxis. Patients should be instructed on identifying signs/symptoms of anaphylaxis and to seek immediate care if they arise.      Contraindications  Severe hypersensitivity reaction to omalizumab or any component of the formulation    Warnings & Precautions  Cardiovascular effects: Cerebrovascular events, including transient ischemic attack and ischemic stroke, have been reported.  Eosinophilia and vasculitis: In rare cases, patients may present with systemic eosinophilia, sometimes presenting with clinical features of vasculitis.    Fever/arthralgia/rash: Reports of a constellation of symptoms including fever, arthritis or arthralgia, rash, and lymphadenopathy have been reported with post-marketing use.  Malignant neoplasms: Have been reported rarely with use in short-term studies; impact of long-term use is not known.  Parasitic infections: Use with caution and monitor patients at high risk for parasitic infections; risk of infection may be increased; appropriate duration of continued monitoring following therapy discontinuation has not been established.  Corticosteroid therapy: Gradually taper systemic or inhaled corticosteroid therapy; do not discontinue corticosteroids abruptly following initiation of omalizumab therapy. The combined use of omalizumab and corticosteroids in patients with chronic idiopathic urticaria has not been evaluated.  Latex: Prefilled syringe: The needle cap may contain natural rubber latex.  Appropriate use: Therapy has not been shown to alleviate  acute asthma exacerbations; do not use to treat acute bronchospasm, status asthmaticus, or other allergic conditions. Do not use to treat forms of urticaria other than chronic idiopathic urticaria.  Dosing/IgE levels: Dosing for asthma is based on body weight and pretreatment total IgE serum levels. IgE levels remain elevated up to 1 year following treatment; therefore, levels taken during treatment or for up to 1 year following treatment cannot and should not be used as a dosage guide. Dosing in chronic idiopathic urticaria is not dependent on serum IgE (free or total) level or body weight.  Pregnancy Considerations: Omalizumab is a humanized monoclonal antibody (IgG1). Potential placental transfer of human IgG is dependent upon the IgG subclass and gestational age, generally increasing as pregnancy progresses.  Breastfeeding Considerations: It is not known if omalizumab is present in breast milk; however, IgG is excreted in human milk.  Based on information from the pregnancy exposure registry, an increased risk of adverse events was not observed in breastfed infants of mothers using omalizumab. According to the manufacturer, the decision to breastfeed during therapy should consider the risk of infant exposure, the benefits of breastfeeding to the infant, and benefits of treatment to the mother      Drug/Food Interactions     Medication list reviewed in Epic. The patient was instructed to inform the care team before taking any new medications or supplements. No drug interactions identified.     Storage, Handling Precautions, & Disposal     Store this medication in the refrigerator, 2??C to 8??C (36??F to 46??F). in the original carton.  Protect from direct sunlight and do not freeze. Must be used within 4 hours after removal from refrigerator.  Do not shake.  Dispose of used syringes in a sharps disposal container.      Current Medications (including OTC/herbals), Comorbidities and Allergies     Current Outpatient Medications   Medication Sig Dispense Refill    omalizumab (XOLAIR) 150 mg/mL syringe Inject the contents of 2 syrings (300 mg total) under the skin every twenty-eight (28) days. 2 mL 5    omalizumab (XOLAIR) 150 mg/mL syringe Inject the contents of 2 syringes (300 mg total) under the skin every twenty-eight (28) days. 2 mL 5     No current facility-administered medications for this visit.       Allergies   Allergen Reactions    Doxepin        There is no problem list on file for this patient.      Reviewed and up to date in Epic.    Appropriateness of Therapy     Acute infections noted within Epic:  No active infections  Patient reported infection: None    Is medication and dose appropriate based on diagnosis and infection status? Yes    Prescription has been clinically reviewed: Yes      Baseline Quality of Life Assessment      How many days over the past month did your idiopathic urticaria  keep you from your normal activities? For example, brushing your teeth or getting up in the morning. 0    Financial Information     Medication Assistance provided: Prior Authorization and Copay Assistance    Anticipated copay of $0 / 28 days reviewed with patient. Verified delivery address.    Delivery Information     Scheduled delivery date: 01/07/22    Expected start date: Continuation of therapy - next dose due due 01/19/21    Medication will be delivered via UPS to  the prescription address in Epic [place].  This shipment will not require a signature.      Explained the services we provide at [facility] and that each month we would call to set up refills.  Stressed importance of returning phone calls so that we could ensure they receive their medications in time each month.  Informed patient that we should be setting up refills 7-10 days prior to when they will run out of medication.  A pharmacist will reach out to perform a clinical assessment periodically.  Informed patient that a welcome packet, containing information about our pharmacy and other support services, a Notice of Privacy Practices, and a drug information handout will be sent.      The patient or caregiver noted above participated in the development of this care plan and knows that they can request review of or adjustments to the care plan at any time.      Patient or caregiver verbalized understanding of the above information as well as how to contact the pharmacy at [phone] option 4 with any questions/concerns.  The pharmacy is open Monday through Friday 8:30am-4:30pm.  A pharmacist is available 24/7 via pager to answer any clinical questions they may have.    Patient Specific Needs     Does the patient have any physical, cognitive, or cultural barriers? No    Does the patient have adequate living arrangements? (i.e. the ability to store and take their medication appropriately) Yes    Did you identify any home environmental safety or security hazards? No    Patient prefers to have medications discussed with  Patient     Is the patient or caregiver able to read and understand education materials at a high school level or above? Yes    Patient's primary language is  English     Is the patient high risk? No    SOCIAL DETERMINANTS OF HEALTH     At the [facility], we have learned that life circumstances - like trouble affording food, housing, utilities, or transportation can affect the health of many of our patients.   That is why we wanted to ask: are you currently experiencing any life circumstances that are negatively impacting your health and/or quality of life? Patient declined to answer    Social Determinants of Health     Financial Resource Strain: Not on file   Internet Connectivity: Not on file   Food Insecurity: Not on file   Tobacco Use: Not on file (04/17/2012)   Housing/Utilities: Not on file   Alcohol Use: Not on file   Transportation Needs: Not on file   Substance Use: Not on file   Health Literacy: Not on file   Physical Activity: Not on file   Interpersonal Safety: Not on file   Stress: Not on file   Intimate Partner Violence: Not on file   Depression: Not on file   Social Connections: Not on file       Would you be willing to receive help with any of the needs that you have identified today? Not applicable       Oliva Bustard, PharmD  [facility] Specialty Pharmacist

## 2021-12-29 ENCOUNTER — Ambulatory Visit (HOSPITAL_COMMUNITY): Payer: BC Managed Care – PPO | Attending: Cardiology

## 2021-12-29 ENCOUNTER — Other Ambulatory Visit: Payer: Self-pay

## 2021-12-29 DIAGNOSIS — Q7962 Hypermobile Ehlers-Danlos syndrome: Secondary | ICD-10-CM | POA: Insufficient documentation

## 2021-12-29 DIAGNOSIS — I340 Nonrheumatic mitral (valve) insufficiency: Secondary | ICD-10-CM

## 2021-12-29 DIAGNOSIS — I361 Nonrheumatic tricuspid (valve) insufficiency: Secondary | ICD-10-CM

## 2021-12-29 DIAGNOSIS — R002 Palpitations: Secondary | ICD-10-CM | POA: Insufficient documentation

## 2021-12-29 LAB — ECHOCARDIOGRAM COMPLETE
Area-P 1/2: 4.53 cm2
S' Lateral: 2.4 cm

## 2021-12-29 MED ORDER — AMBULATORY NON FORMULARY MEDICATION: 2 refills | Status: DC

## 2022-01-06 MED FILL — XOLAIR 150 MG/ML SUBCUTANEOUS SYRINGE: SUBCUTANEOUS | 28 days supply | Qty: 2 | Fill #0

## 2022-01-18 ENCOUNTER — Ambulatory Visit: Payer: Self-pay

## 2022-01-18 ENCOUNTER — Ambulatory Visit (INDEPENDENT_AMBULATORY_CARE_PROVIDER_SITE_OTHER): Payer: BC Managed Care – PPO | Admitting: Sports Medicine

## 2022-01-18 VITALS — BP 114/60 | Ht 58.5 in | Wt 98.0 lb

## 2022-01-18 DIAGNOSIS — M25551 Pain in right hip: Secondary | ICD-10-CM

## 2022-01-18 MED ORDER — PREDNISONE 20 MG PO TABS: 20.0000 mg | ORAL_TABLET | Freq: Two times a day (BID) | ORAL | 0 refills | Status: DC

## 2022-01-18 NOTE — Progress Notes (Signed)
   Established Patient Office Visit  Subjective   Patient ID: Marissa [name], female    DOB: Nov 01, 1965  Age: 57 y.o. MRN: [id number]  Right hip pain.   She is here today with chief complaint of right hip pain that began about 2-1/2 weeks ago.  She reports most of her pain is located in her low back wrapping around the top of her iliac crest.  Sitting is very painful for her.  She describes her pain about a 7-8 out of 10 at its worst.  Her pain tends to ease a little after the first couple steps of the day.  She has noticed her pain starting to radiate down into the top of her thigh.  She has tried ice, heat, rest with minimal improvement.  She denies any injury to the area, numbness or tingling down her leg or weakness.  Of note she has a history of SI joint pain.  ROS as listed above in HPI    Objective:     BP 114/60   Ht 4' 10.5" (1.486 m)   Wt 98 lb (44.5 kg)   BMI 20.13 kg/m   Physical Exam Vitals reviewed.  Constitutional:      General: She is not in acute distress.    Appearance: Normal appearance. She is normal weight. She is not ill-appearing, toxic-appearing or diaphoretic.  Pulmonary:     Effort: Pulmonary effort is normal.  Musculoskeletal:        General: Normal range of motion.  Skin:    General: Skin is warm.  Neurological:     Mental Status: She is alert.  Right hip: No obvious deformity or asymmetry.  Tenderness to palpation of the ASIS, iliac crest, SI joint and piriformis.  Full range of motion with flexion and extension.  She has pain with eccentric extension.  Strength 5/5 equivocal side-to-side with resisted hip flexion and abduction. Negative log roll  Ultrasound of right lateral hip There is 1 area near the ASIS with a small spur The gluteus medius and minimus insertions are noted along the iliac crest There is hypoechoic change at the superior portions of the muscle and tendon No significant tears or other deformities noted  Impression probable  chronic gluteus medius superior insertional strain  Ultrasound and interpretation by Dr. Arlis Porta and Wolfgang Phoenix. Fields, MD      Assessment & Plan:   Problem List Items Addressed This Visit       Other   Hip pain, right - Primary   Relevant Orders   [place] LIMITED JOINT SPACE STRUCTURES LOW RIGHT       Elmore Guise, DO  I observed and examined the patient with the [facility] resident and agree with assessment and plan.  Note reviewed and modified by me.  Ila Mcgill, MD

## 2022-01-26 NOTE — Unmapped (Signed)
[facility] Refill Coordination Note    Specialty Medication(s) to be Shipped:   CF/Pulmonary/Asthma: Xolair 150mg /ml    Other medication(s) to be shipped: No additional medications requested for fill at this time     Natasha Copeland, DOB: 25-Mar-1965  Phone: [phone] (home)       All above HIPAA information was verified with patient.     Was a Nurse, learning disability used for this call? No    Completed refill call assessment today to schedule patient's medication shipment from the [facility] [phone]).  All relevant notes have been reviewed.     Specialty medication(s) and dose(s) confirmed: Regimen is correct and unchanged.   Changes to medications: [name] reports no changes at this time.  Changes to insurance: No  New side effects reported not previously addressed with a pharmacist or physician: None reported  Questions for the pharmacist: No    Confirmed patient received a Conservation officer, historic buildings and a Surveyor, mining with first shipment. The patient will receive a drug information handout for each medication shipped and additional FDA Medication Guides as required.       DISEASE/MEDICATION-SPECIFIC INFORMATION        For patients on injectable medications: Patient currently has 0 doses left.  Next injection is scheduled for 02/14/22.    SPECIALTY MEDICATION ADHERENCE     Medication Adherence    Patient reported X missed doses in the last month: 0  Specialty Medication: XOLAIR 150 mg/mL syringe (omalizumab)  Patient is on additional specialty medications: No  Patient is on more than two specialty medications: No  Any gaps in refill history greater than 2 weeks in the last 3 months: no  Demonstrates understanding of importance of adherence: yes  Informant: patient                                Were doses missed due to medication being on hold? No    XOLAIR 150 mg/mL syringe (omalizumab) :  0 days of medicine on hand       REFERRAL TO PHARMACIST     Referral to the pharmacist: Not needed      [facility]     Shipping address confirmed in Epic.     Delivery Scheduled: Yes, Expected medication delivery date: 02/04/22.     Medication will be delivered via UPS to the prescription address in Epic [place].    Emilly Lavey' [facility] Specialty Technician

## 2022-02-01 ENCOUNTER — Ambulatory Visit: Payer: BC Managed Care – PPO | Admitting: Sports Medicine

## 2022-02-01 ENCOUNTER — Ambulatory Visit (INDEPENDENT_AMBULATORY_CARE_PROVIDER_SITE_OTHER): Payer: BC Managed Care – PPO | Admitting: Sports Medicine

## 2022-02-01 VITALS — BP 116/76 | Ht 58.5 in

## 2022-02-01 DIAGNOSIS — G8929 Other chronic pain: Secondary | ICD-10-CM

## 2022-02-01 DIAGNOSIS — M533 Sacrococcygeal disorders, not elsewhere classified: Secondary | ICD-10-CM | POA: Diagnosis not present

## 2022-02-01 MED ORDER — METHYLPREDNISOLONE ACETATE 40 MG/ML IJ SUSP
40.0000 mg | Freq: Once | INTRAMUSCULAR | Status: AC
  Administered 2022-02-01: 40 mg via INTRA_ARTICULAR

## 2022-02-01 NOTE — Progress Notes (Signed)
Complaint right hip pain  Patient with Ehlers-Danlos syndrome hypermobility type.  She has had multiple joint problems over many years.  For the past 4 to 5 days she has had severe pain in her right hip.  This starts at the right SI joint and radiates around the side of the hip and sometimes into the front of the groin.  She does not know of a specific injury however she can feel the hip feeling like it pops partially out of joint.  That was not happening before the last week. She is on low-dose naltrexone treatment for chronic pain For this reason we have not given her anything but NSAIDs We discussed stopping the low-dose naltrexone for a few days and taking some tramadol for pain. She comes in today to see if injection would be helpful. SI joint injections in the past have reduced her hip and low back pain . Physical exam Pleasant thin female in no acute distress but in some obvious discomfort with standing or sitting BP 116/76   Ht 4' 10.5" (1.486 m)   BMI 20.13 kg/m   Examination reveals that both hips have increased range of motion on rotation and both have full flexion SI joints show easy motion perhaps some increased motion on the right On hip abduction on the right she gets some palpable subluxation of the hip Palpation shows significant pain right over the right SI joint There is no muscular pain noted around the hip or thigh  Procedure:  Injection of RT SI Joint Consent obtained and verified. Time-out conducted. Noted no overlying erythema, induration, or other signs of local infection. Skin prepped in a sterile fashion. Topical analgesic spray: Ethyl chloride. Completed without difficulty.  I used palpation for guidance in standing, flexed position with RT hip rotated outward. Meds: 4 cc of lidocaine 1% and 1 cc of Solu-Medrol 40 Advised to call if fevers/chills, erythema, induration, drainage, or persistent bleeding.

## 2022-02-01 NOTE — Assessment & Plan Note (Signed)
She has had multiple past episodes of SI joint issues.  For this reason I think her main pain generator is the SI joint. Try injection today She can use tramadol as needed for pain for a few days Afterwards we will go back on naltrexone 4-1/2 mg twice a day  If the hip continues to sublux we may need to put her in a restricted hip abduction brace Follow-up scheduled in 1 to 2 weeks

## 2022-02-03 ENCOUNTER — Ambulatory Visit: Payer: BC Managed Care – PPO | Admitting: Sports Medicine

## 2022-02-03 MED FILL — XOLAIR 150 MG/ML SUBCUTANEOUS SYRINGE: SUBCUTANEOUS | 28 days supply | Qty: 2 | Fill #1

## 2022-02-07 ENCOUNTER — Encounter: Payer: Self-pay | Admitting: Sports Medicine

## 2022-02-15 ENCOUNTER — Ambulatory Visit (INDEPENDENT_AMBULATORY_CARE_PROVIDER_SITE_OTHER): Payer: BC Managed Care – PPO | Admitting: Sports Medicine

## 2022-02-15 VITALS — BP 122/74 | Ht 58.5 in

## 2022-02-15 DIAGNOSIS — M533 Sacrococcygeal disorders, not elsewhere classified: Secondary | ICD-10-CM

## 2022-02-15 DIAGNOSIS — G8929 Other chronic pain: Secondary | ICD-10-CM | POA: Diagnosis not present

## 2022-02-15 DIAGNOSIS — S73001S Unspecified subluxation of right hip, sequela: Secondary | ICD-10-CM | POA: Diagnosis not present

## 2022-02-15 NOTE — Assessment & Plan Note (Signed)
She is still having the SI joint pain but at this time I think it has been triggered by the hip subluxation Injection did not help With that in mind we will try the hip stabilization

## 2022-02-15 NOTE — Assessment & Plan Note (Signed)
We are planning to fit her for a light weight hip stabilization brace which she will need to use for a full 8 weeks and gradually wean from this afterwards  In the meantime walk with a cane or crutch to take some pressure off the right hip

## 2022-02-15 NOTE — Progress Notes (Signed)
Chief complaint right hip pain  Patient's right hip has started to sublux regularly without a real specific injury This created SI joint pain We tried a corticosteroid injection into the right SI joint but that did not relieve her pain When she is standing she can control the position of the hip and has less pain until she gets tired When she sits the right hip wants to pop out of place and is very painful When she walks she tries to hold the hip in place by using her hand but it is still uncomfortable She can get relief with lying down unless she rolls over  Last week we discussed and had her fitted for a hip stabilization brace This had to be specially ordered for her size and we are waiting for this to come maybe next week  She tried stopping the low-dose naltrexone As soon as she did this her radicular problems from her neck going down into her left arm and axilla returned She stopped taking tramadol which she was using for the hip pain and went back on the naltrexone Since that time her neck and radicular symptoms have stopped but she is not getting any relief with her right hip pain She does continue to take Voltaren 50 twice a day  Physical exam Pleasant thin female in no acute distress but in some obvious pain BP 122/74   Ht 4' 10.5" (1.486 m)   BMI 20.13 kg/m   Right hip shows increased mobility On standing if she shifts her weight to the right the hip subluxes in this you can palpate SI joints seem to be stable but the right 1 is still painful with pressure We tried walking with a single crutch or with 2 crutches and that does remove some of the pressure She is concerned about using crutches because of previous shoulder subluxations

## 2022-02-16 DIAGNOSIS — J3089 Other allergic rhinitis: Secondary | ICD-10-CM | POA: Diagnosis not present

## 2022-02-16 DIAGNOSIS — J3081 Allergic rhinitis due to animal (cat) (dog) hair and dander: Secondary | ICD-10-CM | POA: Diagnosis not present

## 2022-02-16 DIAGNOSIS — L503 Dermatographic urticaria: Secondary | ICD-10-CM | POA: Diagnosis not present

## 2022-02-16 DIAGNOSIS — R052 Subacute cough: Secondary | ICD-10-CM | POA: Diagnosis not present

## 2022-02-22 DIAGNOSIS — M533 Sacrococcygeal disorders, not elsewhere classified: Secondary | ICD-10-CM | POA: Diagnosis not present

## 2022-02-25 NOTE — Unmapped (Signed)
[facility] Refill Coordination Note    Specialty Medication(s) to be Shipped:   CF/Pulmonary/Asthma: Xolair 150mg /ml    Other medication(s) to be shipped: No additional medications requested for fill at this time     Natasha Copeland, DOB: Jan 14, 1966  Phone: [phone] (home)       All above HIPAA information was verified with patient.     Was a Nurse, learning disability used for this call? No    Completed refill call assessment today to schedule patient's medication shipment from the [facility] [phone]).  All relevant notes have been reviewed.     Specialty medication(s) and dose(s) confirmed: Regimen is correct and unchanged.   Changes to medications: Natasha Copeland reports no changes at this time.  Changes to insurance: No  New side effects reported not previously addressed with a pharmacist or physician: None reported  Questions for the pharmacist: No    Confirmed patient received a Conservation officer, historic buildings and a Surveyor, mining with first shipment. The patient will receive a drug information handout for each medication shipped and additional FDA Medication Guides as required.       DISEASE/MEDICATION-SPECIFIC INFORMATION        For patients on injectable medications: Patient currently has 0 doses left.  Next injection is scheduled for 03/14/22.    SPECIALTY MEDICATION ADHERENCE     Medication Adherence    Patient reported X missed doses in the last month: 0  Specialty Medication: XOLAIR 150 mg/mL syringe (omalizumab)  Patient is on additional specialty medications: No  Patient is on more than two specialty medications: No  Any gaps in refill history greater than 2 weeks in the last 3 months: no  Demonstrates understanding of importance of adherence: yes  Informant: patient  Reliability of informant: reliable  Provider-estimated medication adherence level: good  Patient is at risk for Non-Adherence: No  Reasons for non-adherence: no problems identified                                Were doses missed due to medication being on hold? No    XOLAIR 150 mg/mL syringe (omalizumab)  : 0 days of medicine on hand       REFERRAL TO PHARMACIST     Referral to the pharmacist: Not needed      [facility]     Shipping address confirmed in Epic.     Delivery Scheduled: Yes, Expected medication delivery date: 03/04/22.     Medication will be delivered via UPS to the prescription address in Epic WAM.    Natasha Copeland' W Natasha Copeland Shared [facility] Specialty Technician

## 2022-03-01 DIAGNOSIS — H00015 Hordeolum externum left lower eyelid: Secondary | ICD-10-CM | POA: Diagnosis not present

## 2022-03-01 DIAGNOSIS — H2513 Age-related nuclear cataract, bilateral: Secondary | ICD-10-CM | POA: Diagnosis not present

## 2022-03-03 ENCOUNTER — Ambulatory Visit (INDEPENDENT_AMBULATORY_CARE_PROVIDER_SITE_OTHER): Payer: BC Managed Care – PPO | Admitting: Sports Medicine

## 2022-03-03 VITALS — BP 110/74 | Ht 58.5 in

## 2022-03-03 DIAGNOSIS — S73001S Unspecified subluxation of right hip, sequela: Secondary | ICD-10-CM | POA: Diagnosis not present

## 2022-03-03 DIAGNOSIS — S73001D Unspecified subluxation of right hip, subsequent encounter: Secondary | ICD-10-CM

## 2022-03-03 MED FILL — XOLAIR 150 MG/ML SUBCUTANEOUS SYRINGE: SUBCUTANEOUS | 28 days supply | Qty: 2 | Fill #2

## 2022-03-03 NOTE — Progress Notes (Signed)
PCP: Kelton Pillar, MD  Subjective:   HPI: Patient is a 57 y.o. 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 Last visit on 02/15/2022 pt seen for recurrent R hip subluxation and returns today wearing hip stabilization brace. She has been wearing the brace for just over a week and only takes it off to shower. Hip still subluxes in the shower. Overall she notes significant improvement in hip pain when the brace is on although it is uncomfortable (cutting into her ribs) and difficult to sleep with. She is still having pain in her R SI joint. She has mast cell activation syndrome and saw allergist before starting the brace who recommend lathering with emollient daily which she has been doing. She hasn't had a skin eruption but has had some red streaks on her legs at night.  She is continuing to take naltrexone for pain which helps.  BP 110/74   Ht 4' 10.5" (1.486 m)   BMI 20.13 kg/m    Objective:  Physical Exam:  Gen: Alert, NAD, comfortable in exam room Pulm: breathing unlabored MSK R hip examined with brace on, good ROM of hip flexion, internal and external rotation.  Gait observed with brace on, right hip trendelenburg slightly to the right but appears stable   Assessment & Plan:    Right hip subluxation - continue wearing hip stabilizing brace for another 5 weeks -Follow-up in 5 weeks, will then plan to do trial of taking breaks from wearing the brace -Patient can do Pilates at PT but avoid exercises which would provoke R hip subluxation hip -Walking encouraged, including walking with 1 foot directly in front of the other  -Continue naltrexone for pain  Precious Gilding, DO Family medicine resident, PGY-2  I observed and examined the patient with the resident and agree with assessment and plan.  Note reviewed and modified by me. Ila Mcgill, MD

## 2022-03-03 NOTE — Assessment & Plan Note (Signed)
With hip abduction block brace she has had no subluxations in brace for 1 week. In shower hip wants to sublux. Pain level is less but some over RT SIJ  Brace modified with felt pads to keep pressure off ribs and iliac crest

## 2022-03-19 NOTE — Unmapped (Signed)
Pt requested refill of Xolair via IVR. Sent MyChart questionnaire 03/19/22.

## 2022-03-20 NOTE — Unmapped (Signed)
[facility] Refill Coordination Note    Natasha Copeland, DOB: Aug 04, 1965  Phone: [phone] (home)       All above HIPAA information was verified with patient.         03/19/2022     5:13 PM   Specialty Rx Medication Refill Questionnaire   Which Medications would you like refilled and shipped? Xolair (no supply on hand)   Please list all current allergies: Doxapin   Have you missed any doses in the last 30 days? No   Have you had any changes to your medication(s) since your last refill? No   How many days remaining of each medication do you have at home? 0   If receiving an injectable medication, next injection date is 04/11/2022   Have you experienced any side effects in the last 30 days? No   Please enter the full address (street address, city, state, zip code) where you would like your medication(s) to be delivered to. [address], [place], [place] [address]   Please specify on which day you would like your medication(s) to arrive. Note: if you need your medication(s) within 3 days, please call the pharmacy to schedule your order at [phone]  04/01/2022   Has your insurance changed since your last refill? No   Would you like a pharmacist to call you to discuss your medication(s)? No   Do you require a signature for your package? (Note: if we are billing Medicare Part B or your order contains a controlled substance, we will require a signature) No         Completed refill call assessment today to schedule patient's medication shipment from the [facility] [phone]).  All relevant notes have been reviewed.       Confirmed patient received a Conservation officer, historic buildings and a Surveyor, mining with first shipment. The patient will receive a drug information handout for each medication shipped and additional FDA Medication Guides as required.         REFERRAL TO PHARMACIST     Referral to the pharmacist: Not needed      [facility]     Shipping address confirmed in Epic. Delivery Scheduled: Yes, Expected medication delivery date: 04/01/22.     Medication will be delivered via UPS to the prescription address in Epic WAM.    Tobi Bastos, PharmD   [facility] Specialty Pharmacist

## 2022-03-22 ENCOUNTER — Encounter: Payer: Self-pay | Admitting: Sports Medicine

## 2022-03-31 MED FILL — XOLAIR 150 MG/ML SUBCUTANEOUS SYRINGE: SUBCUTANEOUS | 28 days supply | Qty: 2 | Fill #3

## 2022-04-04 ENCOUNTER — Other Ambulatory Visit: Payer: Self-pay | Admitting: Sports Medicine

## 2022-04-05 ENCOUNTER — Other Ambulatory Visit: Payer: Self-pay

## 2022-04-05 MED ORDER — AMBULATORY NON FORMULARY MEDICATION: 2 refills | Status: DC

## 2022-04-07 ENCOUNTER — Ambulatory Visit (INDEPENDENT_AMBULATORY_CARE_PROVIDER_SITE_OTHER): Payer: BC Managed Care – PPO | Admitting: Sports Medicine

## 2022-04-07 VITALS — BP 106/78 | Ht 58.5 in | Wt 98.0 lb

## 2022-04-07 DIAGNOSIS — H00015 Hordeolum externum left lower eyelid: Secondary | ICD-10-CM | POA: Diagnosis not present

## 2022-04-07 DIAGNOSIS — T148XXA Other injury of unspecified body region, initial encounter: Secondary | ICD-10-CM | POA: Diagnosis not present

## 2022-04-07 DIAGNOSIS — S73001S Unspecified subluxation of right hip, sequela: Secondary | ICD-10-CM

## 2022-04-07 DIAGNOSIS — S73001D Unspecified subluxation of right hip, subsequent encounter: Secondary | ICD-10-CM | POA: Diagnosis not present

## 2022-04-07 MED ORDER — DIAZEPAM 5 MG PO TABS: ORAL_TABLET | ORAL | 3 refills | Status: DC

## 2022-04-07 NOTE — Progress Notes (Signed)
    SUBJECTIVE:   CHIEF COMPLAINT / HPI:   Recurrent R Hip Subluxation  Left Hip Pain Last visit 03/03/22 for recurrent R hip subluxation and was wearing a hip stabilization brace.  She has been wearing this brace for around 6 weeks. She notes brace not helping a lot now. Feels the right hip just as lax as last time and continues to sublux. Initially gave relief comfort wise in terms of SI pain and hip pain but this relief is faded.  Contacted the bracing company to see if her fit was OK they told her that her body was fighting against it. She feels like the left hip is pushing out against the brace. Denies any subluxation of her left hip. Has been not wearing it 24/7 for the past week given the relief has declined. She is taking voltaren which does not control pain. Valium gives some relief which she needs a refill for.  Right toe pain Over last few weeks she ahs also been noticing pain after standing for long periods of time or after walking.  She has a pressure point under her right great toe that is tender to palpation and also sometimes says that shock that travels up her foot from time to time if she hits a certain position.    PERTINENT  PMH / PSH: mast cell activation syndrome; EDS  OBJECTIVE:   BP 106/78   Ht 4' 10.5" (1.486 m)   Wt 98 lb (44.5 kg)   BMI 20.13 kg/m    Hip:  - Inspection: No gross deformity, no swelling, erythema, or ecchymosis b/l-able to sublux and replace right hip in office  - ROM: Hypermobile range of motion on Flexion, extension, abduction, internal and external rotation b/l-pain with internal, external, flexion on right hip; left hip pain with external rotation - Strength: Normal strength in all fields b/l - Neuro/vasc: NV intact distally b/l  Right Foot: Great toe with beginning stage of pressure sore - erythematous and bony prominence felt on palpation, tender to palpation below bony prominence Subluxation of PIP of great toe.   ASSESSMENT/PLAN:    Right hip subluxation, sequela Continues to have subluxation of hip.  Able to in the room multiple times.  Bracing seems to have helped with right hip pain much and has shifted pressure to the left hip and placed downward pressure on left hip.  No subluxation of left hip yet however patient has been in significant amount of pain when wearing brace 24/7. Shockwave therapy performed in office to see if this may help strengthen some of the support around hip. -Patient to call back at beginning of next week to see if improving -Repeat shockwave in 1 week if improved  Subluxation Great toe PIP subluxation on the right.  Starting to form pressure site on this toe.  Provided offloading with insert.  Patient provided for machine to take home to continue doing this at home. -Monitor, would like to avoid this developing into pressure ulcer   Gerrit Heck, MD [place]   I observed and examined the patient with the resident and agree with assessment and plan.  Note reviewed and modified by me.  We used foam padding with a donut to pad the RT great toe.  Consider adding a scaphoid pad.  Ila Mcgill, MD Ila Mcgill, MD

## 2022-04-07 NOTE — Assessment & Plan Note (Signed)
Great toe PIP subluxation on the right.  Starting to form pressure site on this toe.  Provided offloading with insert.  Patient provided for machine to take home to continue doing this at home. -Monitor, would like to avoid this developing into pressure ulcer

## 2022-04-07 NOTE — Assessment & Plan Note (Signed)
Continues to have subluxation of hip.  Able to in the room multiple times.  Bracing seems to have helped with right hip pain much and has shifted pressure to the left hip and placed downward pressure on left hip.  No subluxation of left hip yet however patient has been in significant amount of pain when wearing brace 24/7. Shockwave therapy performed in office to see if this may help strengthen some of the support around hip. -Patient to call back at beginning of next week to see if improving -Repeat shockwave in 1 week if improved

## 2022-04-13 NOTE — Unmapped (Signed)
[facility] Refill Coordination Note    Specialty Medication(s) to be Shipped:   Inflammatory Disorders: Xolair    Other medication(s) to be shipped: No additional medications requested for fill at this time     Natasha Copeland, DOB: 1965-12-01  Phone: [phone] (home)       All above HIPAA information was verified with patient.     Was a Nurse, learning disability used for this call? No    Completed refill call assessment today to schedule patient's medication shipment from the [facility] [phone]).  All relevant notes have been reviewed.     Specialty medication(s) and dose(s) confirmed: Regimen is correct and unchanged.   Changes to medications: Natasha Copeland reports no changes at this time.  Changes to insurance: No  New side effects reported not previously addressed with a pharmacist or physician: None reported  Questions for the pharmacist: No    Confirmed patient received a Conservation officer, historic buildings and a Surveyor, mining with first shipment. The patient will receive a drug information handout for each medication shipped and additional FDA Medication Guides as required.       DISEASE/MEDICATION-SPECIFIC INFORMATION        For patients on injectable medications: Patient currently has 0 doses left.  Next injection is scheduled for 4/22.    SPECIALTY MEDICATION ADHERENCE     Medication Adherence    Patient reported X missed doses in the last month: 0  Specialty Medication: Xolair 300 mg Q28d  Patient is on additional specialty medications: No  Patient is on more than two specialty medications: No  Any gaps in refill history greater than 2 weeks in the last 3 months: no  Demonstrates understanding of importance of adherence: yes  Informant: patient          Were doses missed due to medication being on hold? No    Xolair 150 mg/ml: 0 days of medicine on hand     REFERRAL TO PHARMACIST     Referral to the pharmacist: Not needed      [facility]     Shipping address confirmed in Epic.     Patient was notified of new phone menu : No    Delivery Scheduled: Yes, Expected medication delivery date: 04/27/22.     Medication will be delivered via UPS to the prescription address in Epic WAM.    Natasha Copeland, PharmD   [facility] Specialty Pharmacist

## 2022-04-28 MED FILL — XOLAIR 150 MG/ML SUBCUTANEOUS SYRINGE: SUBCUTANEOUS | 28 days supply | Qty: 2 | Fill #4

## 2022-05-12 ENCOUNTER — Ambulatory Visit (INDEPENDENT_AMBULATORY_CARE_PROVIDER_SITE_OTHER): Payer: BC Managed Care – PPO | Admitting: Sports Medicine

## 2022-05-12 VITALS — BP 110/72 | Ht 58.5 in | Wt 96.0 lb

## 2022-05-12 DIAGNOSIS — M1990 Unspecified osteoarthritis, unspecified site: Secondary | ICD-10-CM | POA: Diagnosis not present

## 2022-05-12 DIAGNOSIS — D8940 Mast cell activation, unspecified: Secondary | ICD-10-CM | POA: Diagnosis not present

## 2022-05-12 DIAGNOSIS — S73001S Unspecified subluxation of right hip, sequela: Secondary | ICD-10-CM | POA: Diagnosis not present

## 2022-05-13 LAB — CBC
Hematocrit: 39.2 % (ref 34.0–46.6)
Hemoglobin: 13.3 g/dL (ref 11.1–15.9)
MCH: 32.5 pg (ref 26.6–33.0)
MCHC: 33.9 g/dL (ref 31.5–35.7)
MCV: 96 fL (ref 79–97)
Platelets: 325 10*3/uL (ref 150–450)
RBC: 4.09 x10E6/uL (ref 3.77–5.28)
RDW: 11.7 % (ref 11.7–15.4)
WBC: 7.4 10*3/uL (ref 3.4–10.8)

## 2022-05-13 LAB — SEDIMENTATION RATE: Sed Rate: 2 mm/hr (ref 0–40)

## 2022-05-13 LAB — C-REACTIVE PROTEIN: CRP: 1 mg/L (ref 0–10)

## 2022-05-13 NOTE — Assessment & Plan Note (Signed)
This continues to be a significant problem and may require [place] to use more pain medication The question is whether there is an inflammatory response associated as in the past she has had high CRP levels Plan to check a CBC CRP and sed rate Trial with a Ace wrap over her pants so it does not irritate her skin wrap to secure her upper thigh  We need to consider some other form of treatment for musculoskeletal pain if her inflammatory markers are high Possibilities include colchicine Possible 3 times daily naltrexone Possible switch back to tramadol coupled with gabapentin

## 2022-05-13 NOTE — Progress Notes (Signed)
Chief complaint follow-up of Carylon Perches Danlos problems with hip dislocation  Patient has been unable to use the hip brace as the weight and pressure that puts on her pelvis is painful She has had multiple subluxations of her right hip and she rarely has had problems her left hip seem to dislocate on 1 day and required her to roll around on the floor until it reset Because of her hip symptoms primarily she has chronic musculoskeletal pain that is no longer controlled by her naltrexone 4 mg twice a day  Other issues are that when we tried to move her to tramadol and dropped the naltrexone the neuropathic pain from her neck to her left arm returned  She has a new skin rash over her right trapezius She has a return of the skin lesion over the PIP of the right fifth finger She has improvement of a skin nodule over her right fifth MTP joint  Physical exam Pleasant female who looks uncomfortable BP 110/72   Ht 4' 10.5" (1.486 m)   Wt 96 lb (43.5 kg)   BMI 19.72 kg/m   The skin rash on her trapezius is maculopapular and appears irritated The nodule over her finger still looks vascular and this was seen on biopsy The nodule over her fifth MTP joint appears to be healing  The remainder of her exam shows significant hypermobility as noted before with no joints currently subluxed

## 2022-05-13 NOTE — Assessment & Plan Note (Signed)
Various skin sensitivity type lesions continue to occur Protect the foot nodule with a Band-Aid  Cortisone cream over the shoulder area We continue to try to wrap the nodule over her fifth finger  Recheck in 1 month

## 2022-05-16 NOTE — Unmapped (Signed)
[facility] Refill Coordination Note    Natasha Copeland, DOB: 05/19/1965  Phone: [phone] (home)       All above HIPAA information was verified with patient.         05/14/2022     1:23 PM   Specialty Rx Medication Refill Questionnaire   Which Medications would you like refilled and shipped? Xolair (I have 0 doses on hand)   If medication refills are not needed at this time, please indicate the reason below. n/a   Please list all current allergies: Doxipen   Have you missed any doses in the last 30 days? No   Have you had any changes to your medication(s) since your last refill? No   How many days remaining of each medication do you have at home? 0   If receiving an injectable medication, next injection date is 06/06/2022   Have you experienced any side effects in the last 30 days? No   Please enter the full address (street address, city, state, zip code) where you would like your medication(s) to be delivered to. [address], [place], [place] [address]   Please specify on which day you would like your medication(s) to arrive. Note: if you need your medication(s) within 3 days, please call the pharmacy to schedule your order at [phone]  05/20/2022   Has your insurance changed since your last refill? No   Would you like a pharmacist to call you to discuss your medication(s)? No   Do you require a signature for your package? (Note: if we are billing Medicare Part B or your order contains a controlled substance, we will require a signature) No         Completed refill call assessment today to schedule patient's medication shipment from the [facility] [phone]).  All relevant notes have been reviewed.       Confirmed patient received a Conservation officer, historic buildings and a Surveyor, mining with first shipment. The patient will receive a drug information handout for each medication shipped and additional FDA Medication Guides as required.         REFERRAL TO PHARMACIST Referral to the pharmacist: Not needed      [facility]     Shipping address confirmed in Epic.     Delivery Scheduled: Yes, Expected medication delivery date: 05/20/22.     Medication will be delivered via UPS to the prescription address in Epic WAM.    Cornelius Marullo' W Danae Chen Shared [facility] Specialty Technician

## 2022-05-19 MED FILL — XOLAIR 150 MG/ML SUBCUTANEOUS SYRINGE: SUBCUTANEOUS | 28 days supply | Qty: 2 | Fill #5

## 2022-05-23 ENCOUNTER — Encounter: Payer: Self-pay | Admitting: Sports Medicine

## 2022-06-01 ENCOUNTER — Other Ambulatory Visit: Payer: Self-pay

## 2022-06-01 DIAGNOSIS — H00015 Hordeolum externum left lower eyelid: Secondary | ICD-10-CM | POA: Diagnosis not present

## 2022-06-01 MED ORDER — AMBULATORY NON FORMULARY MEDICATION: 2 refills | Status: DC

## 2022-06-07 NOTE — Unmapped (Signed)
[facility] Refill Coordination Note    Natasha Copeland, DOB: Jun 06, 1965  Phone: [phone] (home)       All above HIPAA information was verified with patient.         06/06/2022     3:03 PM   Specialty Rx Medication Refill Questionnaire   Which Medications would you like refilled and shipped? Xolair (none on hand)   Please list all current allergies: Doxipen   Have you missed any doses in the last 30 days? No   Have you had any changes to your medication(s) since your last refill? No   How many days remaining of each medication do you have at home? 0   If receiving an injectable medication, next injection date is 07/04/2022   Have you experienced any side effects in the last 30 days? No   Please enter the full address (street address, city, state, zip code) where you would like your medication(s) to be delivered to. [address], [place], [place] [address]   Please specify on which day you would like your medication(s) to arrive. Note: if you need your medication(s) within 3 days, please call the pharmacy to schedule your order at [phone]  06/24/2022   Has your insurance changed since your last refill? No   Would you like a pharmacist to call you to discuss your medication(s)? No   Do you require a signature for your package? (Note: if we are billing Medicare Part B or your order contains a controlled substance, we will require a signature) No         Completed refill call assessment today to schedule patient's medication shipment from the [facility] [phone]).  All relevant notes have been reviewed.       Confirmed patient received a Conservation officer, historic buildings and a Surveyor, mining with first shipment. The patient will receive a drug information handout for each medication shipped and additional FDA Medication Guides as required.         REFERRAL TO PHARMACIST     Referral to the pharmacist: Not needed      [facility]     Shipping address confirmed in Epic.     Delivery Scheduled: Yes, Expected medication delivery date: 06/24/22.     Medication will be delivered via UPS to the prescription address in Epic WAM.    Dezra Mandella' W Danae Chen Shared [facility] Specialty Technician

## 2022-06-14 ENCOUNTER — Ambulatory Visit (INDEPENDENT_AMBULATORY_CARE_PROVIDER_SITE_OTHER): Payer: BC Managed Care – PPO | Admitting: Sports Medicine

## 2022-06-14 VITALS — BP 122/72

## 2022-06-14 DIAGNOSIS — M25562 Pain in left knee: Secondary | ICD-10-CM | POA: Diagnosis not present

## 2022-06-14 DIAGNOSIS — G8929 Other chronic pain: Secondary | ICD-10-CM | POA: Diagnosis not present

## 2022-06-14 DIAGNOSIS — G90A Postural orthostatic tachycardia syndrome (POTS): Secondary | ICD-10-CM | POA: Diagnosis not present

## 2022-06-14 DIAGNOSIS — M25551 Pain in right hip: Secondary | ICD-10-CM

## 2022-06-14 NOTE — Progress Notes (Unsigned)
   Established Patient Office Visit  Subjective   Patient ID: Marissa [name], female    DOB: January 06, 1966  Age: 57 y.o. MRN: [id number]  Right hip, left knee  She is here today for follow-up of her right hip pain.  Since the last time we have increased her naltrexone to 6 cc twice a day.  She reports this helped her baseline pain but not for acute pain.  She still continued to have some point tenderness of her right iliac crest and now some on her left.  Unfortunately she was unable to tolerate bracing that was previously tried.  She is still having a lot of discomfort in the evenings and when waking up in the mornings.  Of note she also is having some left knee pain that she feels may be related to her hips.  She denies any injury to her knees but states it is worse when going up the stairs.She also reports she is having more pots symptoms.    ROS as listed above in HPI    Objective:     BP 122/72   Physical Exam Vitals reviewed.  Constitutional:      General: She is not in acute distress.    Appearance: Normal appearance. She is normal weight. She is not ill-appearing, toxic-appearing or diaphoretic.  Pulmonary:     Effort: Pulmonary effort is normal.  Neurological:     Mental Status: She is alert.   Right hip: No obvious deformity or asymmetry.  Full range of motion with flexion internal and external rotation.  Tenderness to palpation posterior aspect of the iliac crest.  Negative logroll testing.  Negative FADIR, FABER.  Sensation intact to light touch.  Normal gait. Left knee: No obvious deformity or asymmetry.  No ecchymosis or edema or effusion.  Full range of motion from -5-120 degrees.Stable to varus and valgus stress.  Tenderness to palpation along the medial posterior joint line.  Positive discomfort with McMurray's testing.  Normal gait.    Assessment & Plan:   Problem List Items Addressed This Visit       Cardiovascular and Mediastinum   POTS (postural orthostatic  tachycardia syndrome)    Increase sodium intake.  It is possible secondary to the weather change she is having more volume depletion and she realizes.        Other   Hip pain, right - Primary    Unfortunately she has failed conservative measures including therapy, bracing.  We have increased her naltrexone to 6cc twice a day 8.  She reports has helped with her baseline pain.  But not acute pain.  Recommend she trial sleeping and bikes, compression shorts.  Hopeful this will help reduce the amount of motion she is having throughout the evening.We will follow up with her on an as-needed basis.      Knee pain, left    Patient is having acute flare of her pain.  Recommend she continue with ice, oral anti-inflammatories, Topical Voltaren gel and compression sleeve as needed.  Suspicious she has a meniscal contusion, Hopeful this will settle down with conservative measures.  We will follow up with her on an as-needed basis.       Return if symptoms worsen or fail to improve.    Claudie Leach, DO

## 2022-06-14 NOTE — Assessment & Plan Note (Signed)
Increase sodium intake.  It is possible secondary to the weather change she is having more volume depletion and she realizes.

## 2022-06-14 NOTE — Assessment & Plan Note (Signed)
Unfortunately she has failed conservative measures including therapy, bracing.  We have increased her naltrexone to 6cc twice a day 8.  She reports has helped with her baseline pain.  But not acute pain.  Recommend she trial sleeping and bikes, compression shorts.  Hopeful this will help reduce the amount of motion she is having throughout the evening.We will follow up with her on an as-needed basis.

## 2022-06-14 NOTE — Assessment & Plan Note (Addendum)
Patient is having acute flare of her pain.  Recommend she continue with ice, oral anti-inflammatories, Topical Voltaren gel and compression sleeve as needed.  Suspicious she has a meniscal contusion, Hopeful this will settle down with conservative measures.  We will follow up with her on an as-needed basis.

## 2022-06-15 DIAGNOSIS — Z13220 Encounter for screening for lipoid disorders: Secondary | ICD-10-CM | POA: Diagnosis not present

## 2022-06-15 DIAGNOSIS — R000 Tachycardia, unspecified: Secondary | ICD-10-CM | POA: Diagnosis not present

## 2022-06-15 DIAGNOSIS — Z0000 Encounter for general adult medical examination without abnormal findings: Secondary | ICD-10-CM | POA: Diagnosis not present

## 2022-06-15 DIAGNOSIS — D8940 Mast cell activation, unspecified: Secondary | ICD-10-CM | POA: Diagnosis not present

## 2022-06-15 DIAGNOSIS — Q796 Ehlers-Danlos syndrome, unspecified: Secondary | ICD-10-CM | POA: Diagnosis not present

## 2022-06-23 MED FILL — XOLAIR 150 MG/ML SUBCUTANEOUS SYRINGE: SUBCUTANEOUS | 28 days supply | Qty: 2 | Fill #0

## 2022-07-06 DIAGNOSIS — L503 Dermatographic urticaria: Secondary | ICD-10-CM | POA: Diagnosis not present

## 2022-07-06 DIAGNOSIS — L821 Other seborrheic keratosis: Secondary | ICD-10-CM | POA: Diagnosis not present

## 2022-07-06 DIAGNOSIS — D225 Melanocytic nevi of trunk: Secondary | ICD-10-CM | POA: Diagnosis not present

## 2022-07-06 DIAGNOSIS — L578 Other skin changes due to chronic exposure to nonionizing radiation: Secondary | ICD-10-CM | POA: Diagnosis not present

## 2022-07-08 MED ORDER — XOLAIR 150 MG/ML SUBCUTANEOUS SYRINGE
SUBCUTANEOUS | 5 refills | 28 days
Start: 2022-07-08 — End: ?

## 2022-07-11 MED ORDER — XOLAIR 150 MG/ML SUBCUTANEOUS AUTO-INJECTOR
SUBCUTANEOUS | 11 refills | 0 days
Start: 2022-07-11 — End: ?

## 2022-07-11 MED ORDER — OMALIZUMAB 150 MG/ML SUBCUTANEOUS SYRINGE
SUBCUTANEOUS | 5 refills | 28 days
Start: 2022-07-11 — End: ?

## 2022-07-11 NOTE — Unmapped (Signed)
Clinical Assessment Needed For: Formulation Change  Medication: XOLAIR 150 mg/mL Atin (omalizumab) - 1st fill  Last Fill Date/Day Supply: 06/22/22 / 28 - XOLAIR 150 mg/mL syringe (omalizumab)  Copay $0  Was previous dose already scheduled to fill: No    Notes to Pharmacist:

## 2022-07-12 NOTE — Unmapped (Signed)
[facility]   Patient Onboarding/Medication Counseling    Natasha Copeland is a 57 y.o. 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  I am speaking to the patient.    Was a Nurse, learning disability used for this call? No    Verified patient's date of birth / HIPAA.    Specialty medication(s) to be sent: Inflammatory Disorders: Xolair      Non-specialty medications/supplies to be sent: n/a      Medications not needed at this time: n/a       This patient is receiving Xolair PEN in place of Xolair syringe due to  pt preference . We reviewed the administration differences between Xolair PEN v. Syringe and all questions were answered. They have been counseled on missed dose instructions, goals of therapy, side effects and monitoring parameters, warnings and precautions, drug/food interactions, and storage, handling precautions, and disposal. We have previously onboarded this patient to our pharmacy and thus did not discuss pharmacy services or patient specific needs again. This onboarding note is for documentation purposes only.      Xolair Syringes (omalizumab)    Medication & Administration     Dosage: Inject 300mg  under the skin every 28 days    Administration:     Vials: Must be administered as a subcutaneous injection in the abdomen or thighs by a healthcare professional. Patient will be observed after receiving first 3 injections in clinic before moving to home administration.    Prefilled Syringe:  Home administration screening questions:    Has patient ever had an anaphylaxis reaction to Xolair or other agents, such as foods, drugs, biologics, etc? No    Has patient received at least 3 doses in clinic under the supervision of a healthcare proefssional? Yes    Does the patient have an Epi-pen to use for possible anaphylaxis reaction? Yes    Xolair PEN administration:     Gather supplies - Xolair autoinjector, alcohol swab, sterile cotton ball or gauze, small bandage and sharps disposal container   Look at the medication label - look for correct medication, correct dose and check the expiration date. Take note whether your dose requires more than 1 injection   Take the carton out of the refrigerator then lay on the flat surface and allow it to warm up to room temperature for at least 30-45 minutes   Prepare injection site - wash your hands with soap and water and remove the autoinjector out of the cartoon by holding the middle part, being sure not turn the carton upside down, hold the autoinjector by the cap or remove the cap before you are ready to inject    Look at the medication - the liquid in the viewing window should appear clear and colorless to pale brownish-yellow  Choose where to inject - if you are giving yourself the injection: front of the thigh or stomach area (abdomen); if a caregiver is giving the injection: additional option of the outer area of the upper arm. Do not inject within 2-inches directly around your belly button   Clean the injection site - wipe site with alcohol swab in a circular motion and let it dry for 10 seconds, do not touch injection site again before giving injection   Remove the cap - pull cap straight off and throw away in a regular household trash. Do not clean or touch the needle guard   Give the injection - hold the autoinjector at a 90-degree angle  and press straight down while holding the autoinjector firmly against the skin   Monitor the injection - keep the autoinjector in the skin when you hear the 1st click (indicates the injection has started) and monitor by watching the green indictor within the viewing window   Complete injection - listen for the 2nd click (indicates the injection is almost complete) and continue holding the autoinjector in the same position until the green indicator has stopped moving and completing fills the viewing window   Remove - lift the autoinjector straight from the skin and the needle guard will automatically extend and lock over the needle   Disposal - place the used autoinjectors in an FDA sharps disposal or use an appropriate household container (e.g closed and puncture resistant)   Care for the injection - if you see any blood at the injection site, press a cotton ball or gauze on the site and maintain pressure until the bleeding stops, do not rub the injection site  If your prescribed dose requires more than 1 injection - repeat the steps above for the next injection using a new autoinjector, make sure each injection is at least 1 inch apart from each other    Adherence/Missed dose instructions: If you miss a dose take as soon as you remember.  Resume the correct dosing schedule.        Goals of Therapy     Chronic Idiopathic Urticaria  Reduce frequency of hives and hive count   Reduce severity of itch with goal to achieve complete itch relief  Improve overall quality of life    Side Effects & Monitoring Parameters     Commonly reported side effects  Headache  Nausea, vomiting   Injection site reaction  Loss of strength and energy  Common cold symptoms, sore throat, stuffy nose  Ear pain  Painful extremities     The following side effects should be reported to the provider:  Signs of cerebrovascular disease (change in strength on one side is greater than the other, trouble speaking or thinking, change in balance or vision changes)  Signs of DVT (swelling, warmth, numbness, change in color or pain in extremities)  Signs of anaphylaxis (wheezing, chest tightness, swelling of face, lips, tongue or throat)    Monitoring Parameters:   Anaphylactic/hypersensitivity reactions (observe patients for 2 hours after the first 3 injections and 30 minutes after subsequent injections or in accordance with individual institution policies and procedures);   Baseline serum total IgE; FEV1, peak flow, and/or other pulmonary function tests  Monitor for signs of infection    Contraindications, Warnings, & Precautions   [place] Boxed Warning]: Anaphylaxis, including delayed-onset anaphylaxis, has been reported following administration; anaphylaxis may present as bronchospasm, hypotension, syncope, urticaria, and/or angioedema of the throat or tongue. Anaphylaxis has occurred after the first dose and in some cases >1 year after initiation of regular treatment. Due to the risk, patients should be observed closely for an appropriate time period after administration and should receive treatment only under direct medical supervision. Healthcare providers should be prepared to administer appropriate therapy for managing potentially life-threatening anaphylaxis. Patients should be instructed on identifying signs/symptoms of anaphylaxis and to seek immediate care if they arise.      Contraindications  Severe hypersensitivity reaction to omalizumab or any component of the formulation    Warnings & Precautions  Cardiovascular effects: Cerebrovascular events, including transient ischemic attack and ischemic stroke, have been reported.  Eosinophilia and vasculitis: In rare cases, patients may present with  systemic eosinophilia, sometimes presenting with clinical features of vasculitis.    Fever/arthralgia/rash: Reports of a constellation of symptoms including fever, arthritis or arthralgia, rash, and lymphadenopathy have been reported with post-marketing use.  Malignant neoplasms: Have been reported rarely with use in short-term studies; impact of long-term use is not known.  Parasitic infections: Use with caution and monitor patients at high risk for parasitic infections; risk of infection may be increased; appropriate duration of continued monitoring following therapy discontinuation has not been established.  Corticosteroid therapy: Gradually taper systemic or inhaled corticosteroid therapy; do not discontinue corticosteroids abruptly following initiation of omalizumab therapy. The combined use of omalizumab and corticosteroids in patients with chronic idiopathic urticaria has not been evaluated.  Latex: Prefilled syringe: The needle cap may contain natural rubber latex.  Appropriate use: Therapy has not been shown to alleviate acute asthma exacerbations; do not use to treat acute bronchospasm, status asthmaticus, or other allergic conditions. Do not use to treat forms of urticaria other than chronic idiopathic urticaria.  Dosing/IgE levels: Dosing for asthma is based on body weight and pretreatment total IgE serum levels. IgE levels remain elevated up to 1 year following treatment; therefore, levels taken during treatment or for up to 1 year following treatment cannot and should not be used as a dosage guide. Dosing in chronic idiopathic urticaria is not dependent on serum IgE (free or total) level or body weight.  Pregnancy Considerations: Omalizumab is a humanized monoclonal antibody (IgG1). Potential placental transfer of human IgG is dependent upon the IgG subclass and gestational age, generally increasing as pregnancy progresses.  Breastfeeding Considerations: It is not known if omalizumab is present in breast milk; however, IgG is excreted in human milk.  Based on information from the pregnancy exposure registry, an increased risk of adverse events was not observed in breastfed infants of mothers using omalizumab. According to the manufacturer, the decision to breastfeed during therapy should consider the risk of infant exposure, the benefits of breastfeeding to the infant, and benefits of treatment to the mother      Drug/Food Interactions     Medication list reviewed in Epic. The patient was instructed to inform the care team before taking any new medications or supplements. No drug interactions identified.     Storage, Handling Precautions, & Disposal     Store this medication in the refrigerator, 2??C to 8??C (36??F to 46??F). in the original carton.  Protect from direct sunlight and do not freeze. May be removed and placed back in the refrigerator if needed; do not exceed a combined total time of 2 days out of the refrigerator.  Do not shake.  Dispose of used syringes in a sharps disposal container.      Current Medications (including OTC/herbals), Comorbidities and Allergies     Current Outpatient Medications   Medication Sig Dispense Refill    cimetidine (TAGAMET) 400 MG tablet Take 1 tablet (400 mg total) by mouth two (2) times a day.      diazePAM (VALIUM) 5 MG tablet Take 1 tablet (5 mg total) by mouth every eight (8) hours as needed for anxiety.      diclofenac (VOLTAREN) 50 MG EC tablet Take 1 tablet (50 mg total) by mouth two (2) times a day.      fexofenadine (ALLEGRA) 180 MG tablet Take 1 tablet (180 mg total) by mouth daily.      levocetirizine (XYZAL) 5 MG tablet Take 1 tablet (5 mg total) by mouth every evening.  naloxone HCl (NALOXONE, BULK,) Powd 4 mg by Miscellaneous route two (2) times a day.      norgestrel-ethinyl estradioL (LO/OVRAL) 0.3-30 mg-mcg per tablet Take 1 tablet by mouth daily.      omalizumab (XOLAIR) 150 mg/mL AtIn Inject the contents of 2 auto-injectors (300 mg) under the skin every twenty-eight (28) days. 2 mL 11    omalizumab (XOLAIR) 150 mg/mL syringe Inject the contents of 2 syrings (300 mg total) under the skin every twenty-eight (28) days. 2 mL 5    omalizumab (XOLAIR) 150 mg/mL syringe Inject the contents of 2 syringes (300 mg total) under the skin every twenty-eight (28) days. 2 mL 5     No current facility-administered medications for this visit.       Allergies   Allergen Reactions    Doxepin      Reaction of Erythema multiforme       There is no problem list on file for this patient.      Reviewed and up to date in Epic.    Appropriateness of Therapy     Acute infections noted within Epic:  No active infections  Patient reported infection: None    Is medication and dose appropriate based on diagnosis and infection status? Yes    Prescription has been clinically reviewed: Yes      Baseline Quality of Life Assessment      How many days over the past month did your chronic idiopathic urticaria  keep you from your normal activities? For example, brushing your teeth or getting up in the morning. 0    Financial Information     Medication Assistance provided: Prior Authorization    Anticipated copay of $0 reviewed with patient. Verified delivery address.    Delivery Information     Scheduled delivery date: 07/26/22    Expected start date: Continuation of therapy -next dose due 7/15    Medication will be delivered via UPS to the prescription address in [facility].  This shipment will not require a signature.      Explained the services we provide at [facility] and that each month we would call to set up refills.  Stressed importance of returning phone calls so that we could ensure they receive their medications in time each month.  Informed patient that we should be setting up refills 7-10 days prior to when they will run out of medication.  A pharmacist will reach out to perform a clinical assessment periodically.  Informed patient that a welcome packet, containing information about our pharmacy and other support services, a Notice of Privacy Practices, and a drug information handout will be sent.      The patient or caregiver noted above participated in the development of this care plan and knows that they can request review of or adjustments to the care plan at any time.      Patient or caregiver verbalized understanding of the above information as well as how to contact the pharmacy at [phone] option 4 with any questions/concerns.  The pharmacy is open Monday through Friday 8:30am-4:30pm.  A pharmacist is available 24/7 via pager to answer any clinical questions they may have.    Patient Specific Needs     Does the patient have any physical, cognitive, or cultural barriers? No    Does the patient have adequate living arrangements? (i.e. the ability to store and take their medication appropriately) Yes    Did you identify any home environmental safety  or security hazards? No    Patient prefers to have medications discussed with  Patient     Is the patient or caregiver able to read and understand education materials at a high school level or above? Yes    Patient's primary language is  English     Is the patient high risk? No    SOCIAL DETERMINANTS OF HEALTH     At the [facility], we have learned that life circumstances - like trouble affording food, housing, utilities, or transportation can affect the health of many of our patients.   That is why we wanted to ask: are you currently experiencing any life circumstances that are negatively impacting your health and/or quality of life? Patient declined to answer    Social Determinants of Health     Financial Resource Strain: Not on file   Internet Connectivity: Not on file   Food Insecurity: Not on file   Tobacco Use: Not on file (04/17/2012)   Housing/Utilities: Not on file   Alcohol Use: Not on file   Transportation Needs: Not on file   Substance Use: Not on file   Health Literacy: Not on file   Physical Activity: Not on file   Interpersonal Safety: Not on file   Stress: Not on file   Intimate Partner Violence: Not on file   Depression: Not on file   Social Connections: Not on file       Would you be willing to receive help with any of the needs that you have identified today? Not applicable       Oliva Bustard, PharmD  [facility] Specialty Pharmacist

## 2022-07-25 ENCOUNTER — Encounter: Payer: Self-pay | Admitting: *Deleted

## 2022-07-26 ENCOUNTER — Ambulatory Visit (INDEPENDENT_AMBULATORY_CARE_PROVIDER_SITE_OTHER): Payer: BC Managed Care – PPO | Admitting: Sports Medicine

## 2022-07-26 VITALS — BP 125/73 | Ht 58.5 in | Wt 97.0 lb

## 2022-07-26 DIAGNOSIS — M5010 Cervical disc disorder with radiculopathy, unspecified cervical region: Secondary | ICD-10-CM | POA: Diagnosis not present

## 2022-07-26 MED ORDER — PREDNISONE 20 MG PO TABS: 20.0000 mg | ORAL_TABLET | Freq: Two times a day (BID) | ORAL | 1 refills | Status: DC

## 2022-07-26 MED FILL — XOLAIR 150 MG/ML SUBCUTANEOUS AUTO-INJECTOR: SUBCUTANEOUS | 28 days supply | Qty: 2 | Fill #0

## 2022-07-26 NOTE — Unmapped (Signed)
Natasha Copeland 's Xolair shipment will be sent out  as a result of sufficient inventory of the drug.      I have reached out to the patient  at [phone] and communicated the delivery change. We will reschedule the medication for the delivery date that the patient agreed upon.  We have confirmed the delivery date as 7/10, via ups.

## 2022-07-26 NOTE — Progress Notes (Signed)
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 Her neck had been doing pretty well until the last 2 weeks Now she is getting persistent pain that causes tightness in her left upper arm This radiates down into her extensor side of her forearm This radiates down her periscapular area all the way to her rib cage  She has not noted any weakness We tried shifting her to tramadol and taking her off naloxone We added the diazepam for muscle relaxant The diazepam helped with tramadol made no real difference in her pain  She comes for evaluation  Physical exam Pleasant female who looks in discomfort BP 125/73   Ht 4' 10.5" (1.486 m)   Wt 97 lb (44 kg)   BMI 19.93 kg/m   Neck motion is full She gets radiating pain down her periscapular area with neck extension Neck rotation to the left causes some pain in her left trapezius Left trapezius is very tight Neurologic testing C5-T1 reveals no strength deficits She does have 3+ biceps tendon reflexes bilaterally today Triceps reflexes are 1+

## 2022-07-26 NOTE — Assessment & Plan Note (Signed)
She has had a recurrence of her radiculopathy symptoms I am suspicious that she could have another disc We will give her a course of prednisone 20 twice daily for 1 week and then repeated for second week if needed Continue the diazepam for muscle relaxation Go back to low-dose naltrexone and stop the tramadol  Recheck in 2 weeks

## 2022-08-10 ENCOUNTER — Ambulatory Visit: Payer: BC Managed Care – PPO | Admitting: Sports Medicine

## 2022-08-10 VITALS — BP 126/80 | Ht 58.5 in | Wt 97.0 lb

## 2022-08-10 DIAGNOSIS — M5010 Cervical disc disorder with radiculopathy, unspecified cervical region: Secondary | ICD-10-CM

## 2022-08-10 MED ORDER — PREGABALIN 50 MG PO CAPS: 50.0000 mg | ORAL_CAPSULE | Freq: Three times a day (TID) | ORAL | 2 refills | Status: DC

## 2022-08-10 NOTE — Assessment & Plan Note (Signed)
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 Time-out conducted. Noted no overlying erythema, induration, or other signs of local infection. Skin prepped in a sterile fashion. alcohol Topical analgesic spray: Ethyl chloride. Completed without difficulty. Meds: 3cc lidocaine and I peppered the area of muscle spasm with the needle Advised to call if fevers/chills, erythema, induration, drainage, or persistent bleeding.  I suggested using her collar more when she can to take some weight off the neck Continue on activities that she can do without significant pain Trial on Lyrica 50 mg 3 times daily  If not improved we may need to get a repeat MRI and see if a directed injection would help her relieve some of the significant pain She will see me again in 3 to 4 weeks

## 2022-08-10 NOTE — Progress Notes (Signed)
Complaint: Severe radicular pain into the left trapezius and left arm  Patient with Ehlers-Danlos who has had previous problems with cervical disc rupture and a failed fusion She has a known cervical radiculopathy She just finished a 2-week course of prednisone The first week helped quite a bit but by the second week the pain was coming back But today she is able to work a few hours but then needs to lie down to get rid of the pain Pain localizes over her left trapezius and down into her left arm but also radiates down around her scapula and underneath the scapular tip  She tried going off naltrexone and taking tramadol but that did not help The low-dose Valium has been an excellent muscle relaxant and is one of the few things is helped a lot She continues on diclofenac but without much response  Physical examination Pleasant female who looks in discomfort BP 126/80   Ht 4' 10.5" (1.486 m)   Wt 97 lb (44 kg)   BMI 19.93 kg/m   Neck motion is full and consistent with her hypermobility None of the neck motions trigger a worsening of the radiculopathy Left trapezius shows spasm Strength testing of C5-T1 is normal No sensory loss in the left arm

## 2022-08-16 ENCOUNTER — Ambulatory Visit: Payer: BC Managed Care – PPO | Admitting: Sports Medicine

## 2022-08-18 MED ORDER — XOLAIR 300 MG/2 ML SUBCUTANEOUS SYRINGE
SUBCUTANEOUS | 0 refills | 28 days
Start: 2022-08-18 — End: ?

## 2022-08-18 NOTE — Unmapped (Signed)
[facility] Clinical Assessment & Refill Coordination Note    Natasha Copeland, DOB: March 30, 1965  Phone: [phone] (home)     All above HIPAA information was verified with patient.     Was a Nurse, learning disability used for this call? No    Specialty Medication(s):   Inflammatory Disorders: Xolair     Current Outpatient Medications   Medication Sig Dispense Refill    cimetidine (TAGAMET) 400 MG tablet Take 1 tablet (400 mg total) by mouth two (2) times a day.      diazePAM (VALIUM) 5 MG tablet Take 1 tablet (5 mg total) by mouth every eight (8) hours as needed for anxiety.      diclofenac (VOLTAREN) 50 MG EC tablet Take 1 tablet (50 mg total) by mouth two (2) times a day.      fexofenadine (ALLEGRA) 180 MG tablet Take 1 tablet (180 mg total) by mouth daily.      levocetirizine (XYZAL) 5 MG tablet Take 1 tablet (5 mg total) by mouth every evening.      naloxone HCl (NALOXONE, BULK,) Powd 4 mg by Miscellaneous route two (2) times a day.      norgestrel-ethinyl estradioL (LO/OVRAL) 0.3-30 mg-mcg per tablet Take 1 tablet by mouth daily.      omalizumab (XOLAIR) 150 mg/mL AtIn Inject the contents of 2 auto-injector pens (300 mg) under the skin every twenty-eight (28) days. 2 mL 11    omalizumab (XOLAIR) 150 mg/mL syringe Inject the contents of 2 syrings (300 mg total) under the skin every twenty-eight (28) days. 2 mL 5    omalizumab (XOLAIR) 150 mg/mL syringe Inject the contents of 2 syringes (300 mg total) under the skin every twenty-eight (28) days. 2 mL 5     No current facility-administered medications for this visit.        Changes to medications: Lanika reports no changes at this time.    Allergies   Allergen Reactions    Doxepin      Reaction of Erythema multiforme       Changes to allergies: No    SPECIALTY MEDICATION ADHERENCE     Xolair 150 mg/ml: 0 days of medicine on hand     Medication Adherence    Patient reported X missed doses in the last month: 0  Specialty Medication: Xolair 150 mg/mL - inject 300mg  Q28d  Patient is on additional specialty medications: No  Patient is on more than two specialty medications: No  Any gaps in refill history greater than 2 weeks in the last 3 months: no  Demonstrates understanding of importance of adherence: yes  Informant: patient          Specialty medication(s) dose(s) confirmed: Regimen is correct and unchanged. However, [name] is interested in switching to the 300mg  auto-injector pen instead to reduce the number of injections per dose. I will call her provider's office to request a new prescription on her behalf.     Are there any concerns with adherence? No    Adherence counseling provided? Not needed    CLINICAL MANAGEMENT AND INTERVENTION      Clinical Benefit Assessment:    Do you feel the medicine is effective or helping your condition? Yes    Clinical Benefit counseling provided? Not needed    Adverse Effects Assessment:    Are you experiencing any side effects? No    Are you experiencing difficulty administering your medicine? 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 For example, brushing your teeth or getting up in the morning. 0    Have you discussed this with your provider? Not needed    Acute Infection Status:    Acute infections noted within Epic:  No active infections  Patient reported infection: None    Therapy Appropriateness:    Is therapy appropriate and patient progressing towards therapeutic goals? Yes, therapy is appropriate and should be continued    DISEASE/MEDICATION-SPECIFIC INFORMATION      For patients on injectable medications: Patient currently has 0 doses left.  Next injection is scheduled for 7/12.    Chronic Inflammatory Diseases: Have you experienced any flares in the last month? No  Has this been reported to your provider? No    PATIENT SPECIFIC NEEDS Does the patient have any physical, cognitive, or cultural barriers? No    Is the patient high risk? No    Did the patient require a clinical intervention? No    Does the patient require physician intervention or other additional services (i.e., nutrition, smoking cessation, social work)? No    SOCIAL DETERMINANTS OF HEALTH     At the [facility], we have learned that life circumstances - like trouble affording food, housing, utilities, or transportation can affect the health of many of our patients.   That is why we wanted to ask: are you currently experiencing any life circumstances that are negatively impacting your health and/or quality of life? Patient declined to answer    Social Determinants of Health     Financial Resource Strain: Not on file   Internet Connectivity: Not on file   Food Insecurity: Not on file   Tobacco Use: Not on file (04/17/2012)   Housing/Utilities: Not on file   Alcohol Use: Not on file   Transportation Needs: Not on file   Substance Use: Not on file   Health Literacy: Not on file   Physical Activity: Not on file   Interpersonal Safety: Unknown (08/18/2022)    Interpersonal Safety     Unsafe Where You Currently Live: Not on file     Physically Hurt by Anyone: Not on file     Abused by Anyone: Not on file   Stress: Not on file   Intimate Partner Violence: Not on file   Depression: Not on file   Social Connections: Not on file       Would you be willing to receive help with any of the needs that you have identified today? Not applicable       SHIPPING     Specialty Medication(s) to be Shipped:   Inflammatory Disorders: Xolair    Other medication(s) to be shipped: No additional medications requested for fill at this time     Changes to insurance: No    Delivery Scheduled: Yes, Expected medication delivery date: 08/25/22.     Medication will be delivered via UPS to the confirmed prescription address in [facility].    The patient will receive a drug information handout for each medication shipped and additional FDA Medication Guides as required.  Verified that patient has previously received a Conservation officer, historic buildings and a Surveyor, mining.    The patient or caregiver noted above participated in the development of this care plan and  knows that they can request review of or adjustments to the care plan at any time.      All of the patient's questions and concerns have been addressed.    Oliva Bustard, PharmD   [facility] Specialty Pharmacist

## 2022-08-24 ENCOUNTER — Other Ambulatory Visit: Payer: Self-pay

## 2022-08-24 MED ORDER — AMBULATORY NON FORMULARY MEDICATION: 2 refills | Status: DC

## 2022-08-24 MED FILL — XOLAIR 300 MG/2 ML SUBCUTANEOUS AUTO-INJECTOR: SUBCUTANEOUS | 28 days supply | Qty: 2 | Fill #0

## 2022-08-31 ENCOUNTER — Ambulatory Visit
Admission: RE | Admit: 2022-08-31 | Discharge: 2022-08-31 | Disposition: A | Discharge status: Home / Self Care | Payer: BC Managed Care – PPO | Source: Ambulatory Visit | Attending: Sports Medicine | Admitting: Sports Medicine

## 2022-08-31 ENCOUNTER — Ambulatory Visit (INDEPENDENT_AMBULATORY_CARE_PROVIDER_SITE_OTHER): Payer: BC Managed Care – PPO | Admitting: Sports Medicine

## 2022-08-31 ENCOUNTER — Other Ambulatory Visit: Payer: Self-pay | Admitting: Sports Medicine

## 2022-08-31 VITALS — BP 102/68 | Ht 58.5 in | Wt 97.0 lb

## 2022-08-31 DIAGNOSIS — M5010 Cervical disc disorder with radiculopathy, unspecified cervical region: Secondary | ICD-10-CM

## 2022-08-31 DIAGNOSIS — M542 Cervicalgia: Secondary | ICD-10-CM | POA: Diagnosis not present

## 2022-08-31 DIAGNOSIS — Z981 Arthrodesis status: Secondary | ICD-10-CM | POA: Diagnosis not present

## 2022-08-31 NOTE — Progress Notes (Signed)
Chief complaint radicular pain going into the left shoulder and scapula  Patient with Ehlers-Danlos syndrome Previous cervical fusion failed She has had a recurrence of radicular pain that we have been trying to treat with limited success She said the following complications with things we have tried: Lyrica falls and dizziness Lidocaine - persistent rash Trigger point injection - few hours of pain relief  Today she comes back and states that she is able to work a couple hours but then has to lie down to get relief from the pain  She does note that she is dropping more things with her left hand but does not actually feel weak  Physical exam Pleasant white female in no acute distress BP 102/68   Ht 4' 10.5" (1.486 m)   Wt 97 lb (44 kg)   BMI 19.93 kg/m   Full range of motion of the neck without triggering symptoms Slight tightness in motion of the left shoulder compared to the right Strength is good in testing C5-T1 Grip strength is still good Biceps reflexes are still equal side-to-side but her left triceps reflex seems diminished today compared to before

## 2022-08-31 NOTE — Patient Instructions (Signed)
Strategies  Keep up isometrics neck Keep up motion for shoulder Ice massage frequently Zostrix/ Capsaicain - use up to 4 x daily Wash hands after

## 2022-08-31 NOTE — Assessment & Plan Note (Signed)
This is 1 of several episodes of radiculopathy into her left arm  She has a very localized sensitivity running down her trapezius to her shoulder We are going to try topical capsaicin cream on this although I am concerned that she may have skin hypersensitivity She will also do ice massage along the nerve root that is so sensitive  Try these measures for the next month and then we will reevaluate  I did want to get flexion and extension views of the neck to be sure that she has not got some cervical instability developing

## 2022-09-13 NOTE — Unmapped (Signed)
[facility] Refill Coordination Note    Natasha Copeland, DOB: February 14, 1965  Phone: [phone] (home)       All above HIPAA information was verified with patient.         09/12/2022    11:18 AM   Specialty Rx Medication Refill Questionnaire   Which Medications would you like refilled and shipped? Xolair, 0 on hand   Please list all current allergies: Doxopin   Have you missed any doses in the last 30 days? No   Have you had any changes to your medication(s) since your last refill? No   How many days remaining of each medication do you have at home? 0   If receiving an injectable medication, next injection date is 09/26/2022   Have you experienced any side effects in the last 30 days? No   Please enter the full address (street address, city, state, zip code) where you would like your medication(s) to be delivered to. [address], [place], [place] [address]   Please specify on which day you would like your medication(s) to arrive. Note: if you need your medication(s) within 3 days, please call the pharmacy to schedule your order at [phone]  09/21/2022   Has your insurance changed since your last refill? No   Would you like a pharmacist to call you to discuss your medication(s)? No   Do you require a signature for your package? (Note: if we are billing Medicare Part B or your order contains a controlled substance, we will require a signature) No         Completed refill call assessment today to schedule patient's medication shipment from the [facility] [phone]).  All relevant notes have been reviewed.       Confirmed patient received a Conservation officer, historic buildings and a Surveyor, mining with first shipment. The patient will receive a drug information handout for each medication shipped and additional FDA Medication Guides as required.         REFERRAL TO PHARMACIST     Referral to the pharmacist: Not needed      [facility]     Shipping address confirmed in Epic.     Delivery Scheduled: Yes, Expected medication delivery date: 09/21/22.     Medication will be delivered via UPS to the prescription address in Epic W[facility].    Natasha Copeland' [facility] Specialty Technician

## 2022-09-15 ENCOUNTER — Other Ambulatory Visit: Payer: Self-pay | Admitting: Oncology

## 2022-09-15 DIAGNOSIS — Z006 Encounter for examination for normal comparison and control in clinical research program: Secondary | ICD-10-CM

## 2022-09-20 MED FILL — XOLAIR 300 MG/2 ML SUBCUTANEOUS AUTO-INJECTOR: SUBCUTANEOUS | 28 days supply | Qty: 2 | Fill #1

## 2022-09-28 ENCOUNTER — Ambulatory Visit (INDEPENDENT_AMBULATORY_CARE_PROVIDER_SITE_OTHER): Payer: BC Managed Care – PPO | Admitting: Sports Medicine

## 2022-09-28 VITALS — BP 116/78 | Ht 58.5 in | Wt 97.0 lb

## 2022-09-28 DIAGNOSIS — L503 Dermatographic urticaria: Secondary | ICD-10-CM | POA: Diagnosis not present

## 2022-09-28 DIAGNOSIS — R052 Subacute cough: Secondary | ICD-10-CM | POA: Diagnosis not present

## 2022-09-28 DIAGNOSIS — J3089 Other allergic rhinitis: Secondary | ICD-10-CM | POA: Diagnosis not present

## 2022-09-28 DIAGNOSIS — J3081 Allergic rhinitis due to animal (cat) (dog) hair and dander: Secondary | ICD-10-CM | POA: Diagnosis not present

## 2022-09-28 DIAGNOSIS — M5010 Cervical disc disorder with radiculopathy, unspecified cervical region: Secondary | ICD-10-CM

## 2022-09-28 DIAGNOSIS — M25551 Pain in right hip: Secondary | ICD-10-CM | POA: Diagnosis not present

## 2022-09-28 NOTE — Assessment & Plan Note (Signed)
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 Treated with trigger point injection today to see how she responds.

## 2022-09-28 NOTE — Progress Notes (Signed)
Chief complaint: Left trapezius and right iliac crest pain  Patient is having fairly severe spasm over her left trapezius.  This started with probable ruptured disc and radicular symptoms that have gradually improved but still with a lot of spasm.  The pain radiates down beside her scapula to just below it.  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  Other problem is right iliac crest pain without a known injury.  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  Physical exam Pleasant white female in no acute distress BP 116/78   Ht 4' 10.5" (1.486 m)   Wt 97 lb (44 kg)   BMI 19.93 kg/m  Patient has tenderness directly along the right iliac crest it is just below the attachment to the tendon to the midportion of the crest there is no spasm in the muscles surrounding this  Patient has trapezius spasm that is significant on the left and visible.  Palpation reveals tightness and tenderness.  Neurologic testing was unremarkable  Trigger point injection The right iliac crest was injected with 3 cc of Marcaine after sprain the local area with ethyl chloride for anesthesia. The area was prepped with alcohol prior to injection.  ESWT The left trapezius and the area going down along the periscapular area and over onto the upper deltoid on the left side was treated with ESWT Frequency 10 Power level 70 Shockwaves 2200 Head size large  Patient tolerated this well.

## 2022-09-28 NOTE — Assessment & Plan Note (Signed)
This seems to gradually be improving some as it is not constant.  However with her significant spasm over her trapezius we gave her a trial of ESWT today.  She may return in a couple weeks to try this again if she gets good relief.

## 2022-09-29 DIAGNOSIS — Z1231 Encounter for screening mammogram for malignant neoplasm of breast: Secondary | ICD-10-CM | POA: Diagnosis not present

## 2022-09-29 LAB — HM MAMMOGRAPHY

## 2022-10-05 DIAGNOSIS — R8290 Unspecified abnormal findings in urine: Secondary | ICD-10-CM | POA: Diagnosis not present

## 2022-10-05 DIAGNOSIS — Z01419 Encounter for gynecological examination (general) (routine) without abnormal findings: Secondary | ICD-10-CM | POA: Diagnosis not present

## 2022-10-05 DIAGNOSIS — Z124 Encounter for screening for malignant neoplasm of cervix: Secondary | ICD-10-CM | POA: Diagnosis not present

## 2022-10-05 DIAGNOSIS — Z6820 Body mass index (BMI) 20.0-20.9, adult: Secondary | ICD-10-CM | POA: Diagnosis not present

## 2022-10-06 LAB — HM PAP SMEAR

## 2022-10-07 DIAGNOSIS — F4323 Adjustment disorder with mixed anxiety and depressed mood: Secondary | ICD-10-CM | POA: Diagnosis not present

## 2022-10-12 ENCOUNTER — Ambulatory Visit (INDEPENDENT_AMBULATORY_CARE_PROVIDER_SITE_OTHER): Payer: BC Managed Care – PPO | Admitting: Sports Medicine

## 2022-10-12 VITALS — BP 110/62 | Ht 58.5 in | Wt 97.0 lb

## 2022-10-12 DIAGNOSIS — Q796 Ehlers-Danlos syndrome, unspecified: Secondary | ICD-10-CM | POA: Diagnosis not present

## 2022-10-12 DIAGNOSIS — M7918 Myalgia, other site: Secondary | ICD-10-CM

## 2022-10-12 DIAGNOSIS — M25551 Pain in right hip: Secondary | ICD-10-CM

## 2022-10-12 DIAGNOSIS — S73001S Unspecified subluxation of right hip, sequela: Secondary | ICD-10-CM

## 2022-10-12 NOTE — Unmapped (Signed)
[facility] Refill Coordination Note    Natasha Copeland, DOB: [birth year]  Phone: [phone] (home)       All above HIPAA information was verified with patient.         10/12/2022    12:41 PM   Specialty Rx Medication Refill Questionnaire   Which Medications would you like refilled and shipped? Xolair (none on hand)   Please list all current allergies: Doxipen   Have you missed any doses in the last 30 days? No   Have you had any changes to your medication(s) since your last refill? No   How many days remaining of each medication do you have at home? 0   If receiving an injectable medication, next injection date is 10/24/2022   Have you experienced any side effects in the last 30 days? No   Please enter the full address (street address, city, state, zip code) where you would like your medication(s) to be delivered to. [address], [place], [place] [address]   Please specify on which day you would like your medication(s) to arrive. Note: if you need your medication(s) within 3 days, please call the pharmacy to schedule your order at [phone]  10/19/2022   Has your insurance changed since your last refill? No   Would you like a pharmacist to call you to discuss your medication(s)? No   Do you require a signature for your package? (Note: if we are billing Medicare Part B or your order contains a controlled substance, we will require a signature) No         Completed refill call assessment today to schedule patient's medication shipment from the [facility] [phone]).  All relevant notes have been reviewed.       Confirmed patient received a Conservation officer, historic buildings and a Surveyor, mining with first shipment. The patient will receive a drug information handout for each medication shipped and additional FDA Medication Guides as required.         REFERRAL TO PHARMACIST     Referral to the pharmacist: Not needed      [facility]     Shipping address confirmed in Epic.     Delivery Scheduled: Yes, Expected medication delivery date: 10/19/22.     Medication will be delivered via UPS to the prescription address in Epic WAM.    Oliva Bustard, PharmD   [facility] Specialty Pharmacist

## 2022-10-12 NOTE — Assessment & Plan Note (Signed)
She continues to get occasional subluxations particularly with walking up steps  I think most of her pain today is centered around the muscles that control the stability of the right SI joint and the right hip Trial today with ESWT She is sensitive to treatments and so we started with a low-dose If this proves to be helpful we may put her on a weekly course of treatments for the next 6 to 8 weeks

## 2022-10-12 NOTE — Progress Notes (Signed)
Chief complaint right lateral hip pain radiating down the ITB  Patient with Ehlers-Danlos syndrome has been having a lot of painful muscular conditions. Last visit we tried some shockwave to her trapezius and this caused some significant discomfort for 2 days but then the symptoms actually improved. Her right lateral hip we tried using trigger point injections and this seemed to make only a minimal amount of difference in her pain  She gets more pain if she has to stand for a period of time or walk up stairs. She feels like the right SI joint is moving too much and the pain starts at the SI joint and sometimes radiates down her right iliotibial band  Physical exam Pleasant white female in no acute distress BP 110/62   Ht 4' 10.5" (1.486 m)   Wt 97 lb (44 kg)   BMI 19.93 kg/m   Exam shows full range of motion of the hip She gets pain on direct palpation of the SI joint and some pain on palpation of the right lateral iliac crest There is increased mobility of SI joints on movement Straight leg raise and low back examination are not remarkable  Trapezius exams today show that she has some slight increased tension bilaterally but better than what I typically see and equal from right to left  ESWT Trial of ESWT for right SI joint lateral hip and iliotibial band Impulses 2200 Power level 60 mJ Head size large Frequency 10  Patient tolerated the procedure well.  Because of previous reactions we stayed at low dose

## 2022-10-12 NOTE — Addendum Note (Signed)
Addended by: Rutha Bouchard E on: 10/12/2022 02:56 PM   Modules accepted: Orders

## 2022-10-14 ENCOUNTER — Other Ambulatory Visit: Payer: Self-pay

## 2022-10-14 ENCOUNTER — Ambulatory Visit: Payer: BC Managed Care – PPO | Attending: Sports Medicine

## 2022-10-14 DIAGNOSIS — R252 Cramp and spasm: Secondary | ICD-10-CM

## 2022-10-14 DIAGNOSIS — R262 Difficulty in walking, not elsewhere classified: Secondary | ICD-10-CM | POA: Insufficient documentation

## 2022-10-14 DIAGNOSIS — M7981 Nontraumatic hematoma of soft tissue: Secondary | ICD-10-CM | POA: Diagnosis not present

## 2022-10-14 DIAGNOSIS — M6281 Muscle weakness (generalized): Secondary | ICD-10-CM | POA: Insufficient documentation

## 2022-10-14 DIAGNOSIS — Q796 Ehlers-Danlos syndrome, unspecified: Secondary | ICD-10-CM | POA: Diagnosis not present

## 2022-10-14 DIAGNOSIS — M25551 Pain in right hip: Secondary | ICD-10-CM | POA: Insufficient documentation

## 2022-10-14 NOTE — Therapy (Signed)
OUTPATIENT PHYSICAL THERAPY LOWER EXTREMITY EVALUATION   Patient Name: Marissa [name] MRN: [id number] DOB:1965-06-18, 57 y.o., female Today's Date: 10/14/2022  END OF SESSION:  PT End of Session - 10/14/22 1020     Visit Number 1    Date for PT Re-Evaluation 12/09/22    Authorization Type BC[employer]    PT Start Time 1017    PT Stop Time 1100    PT Time Calculation (min) 43 min    Activity Tolerance Patient limited by pain    Behavior During Therapy [facility] for tasks assessed/performed             Past Medical History:  Diagnosis Date   Ehlers-Danlos syndrome    History of pituitary tumor    Ocular migraine    Past Surgical History:  Procedure Laterality Date   KNEE SURGERY N/A 2012   SHOULDER SURGERY N/A 2009   UTERINE FIBROID SURGERY N/A    Patient Active Problem List   Diagnosis Date Noted   Subluxation 04/07/2022   Right hip subluxation, sequela 02/15/2022   Asthma 11/11/2021   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   Finger pain, right 07/15/2021   Knee pain, left 04/25/2019   Bicipital tendonitis of left shoulder 04/25/2019   Varicose veins of bilateral lower extremities with other complications 10/25/2018   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   POTS (postural orthostatic tachycardia syndrome) 08/29/2017   Chronic pain syndrome 08/29/2017   Chronic insomnia 08/29/2017   Wrist pain, acute 06/02/2016   Enthesopathy of right shoulder 01/28/2016   Right shoulder pain 01/28/2016   Family history of early CAD 11/30/2015   Cervical disc disorder with radiculopathy of cervical region 05/13/2014   Chronic right SI joint pain 08/28/2013   Hip pain, right 06/25/2013   Elbow pain, right 02/13/2013   Bunionette 01/07/2013   Ehlers-Danlos disease 11/08/2012   Unspecified visual disturbance 04/19/2012   Pituitary adenoma with extrasellar extension (HCC) 04/19/2012    PCP: Maurice Small, MD   REFERRING PROVIDER: Enid Baas, MD  REFERRING DIAG: [phone] (ICD-10-CM) -  Ehlers-Danlos disease M25.551 (ICD-10-CM) - Hip pain, right M79.18 (ICD-10-CM) - Myalgia, multiple sites  THERAPY DIAG:  Muscle weakness (generalized) - Plan: PT plan of care cert/re-cert  Difficulty in walking, not elsewhere classified - Plan: PT plan of care cert/re-cert  Cramp and spasm - Plan: PT plan of care cert/re-cert  Ehlers-Danlos syndrome - Plan: PT plan of care cert/re-cert  Rationale for Evaluation and Treatment: Rehabilitation  ONSET DATE: 2005 (1990 diagnosed with POTS)  SUBJECTIVE:   SUBJECTIVE STATEMENT: Patient reports many years of orthopedic and neuro issues.    PERTINENT HISTORY: POTS as well PAIN:  Are you having pain? Yes: NPRS scale: 6-9/10 Pain location: all over Pain description: aching, numbness Aggravating factors: everything Relieving factors: once in a while,  pilates helps, she does this on a regular basis  PRECAUTIONS: Other: ED , POTS, "Mast cell activation disorder"  RED FLAGS: None   WEIGHT BEARING RESTRICTIONS: No  FALLS:  Has patient fallen in last 6 months? Yes. Number of falls 3 (1 on lyrica, and the other 2 from hip and knee subluxations)  LIVING ENVIRONMENT: Lives with: lives alone Lives in: House/apartment Has upstairs but could live downstairs if needed  OCCUPATION: Child psychotherapist, Engineer, structural (lots of driving, standing, giving talks)  PLOF: Independent, Independent with basic ADLs, Independent with household mobility without device, Independent with community mobility without device, Independent with gait, and Independent with transfers  PATIENT GOALS: decreased  pain  NEXT MD VISIT: prn  OBJECTIVE:   DIAGNOSTIC FINDINGS: Multiple cervical spine and many other diagnostics on this patient.  See in chart review.    COGNITION: Overall cognitive status: Within functional limits for tasks assessed     SENSATION: WFL  MUSCLE LENGTH: Hamstrings: Right approx 45 deg; Left approx 50 deg Thomas test:  Right neg; Left neg  POSTURE: No Significant postural limitations  LOWER EXTREMITY ROM:  WFL (left hip slightly limited)  LOWER EXTREMITY MMT:  Generally 4+ to 5/5   FUNCTIONAL TESTS:  5 times sit to stand: 6.61 sec Timed up and go (TUG): 6.55 sec  GAIT: Distance walked: 30 Assistive device utilized: None Level of assistance: Complete Independence Comments: normal   TODAY'S TREATMENT:                                                                                                                              DATE: 10/14/23 Initial eval completed and suggested pool only    PATIENT EDUCATION:  Education details: explained that pool may allow for more ease of movement but will not likely help with joint stability  Person educated: Patient Education method: Explanation Education comprehension: verbalized understanding  HOME EXERCISE PROGRAM: Did not issue, patient speaks of multiple attempts at exercises for stability.  She feels that no one understands her condition well.  She described exercises that evaluating PT would have recommended but she felt these were contraindicated for her.  The only person she has had a little success with is her Gaffer.  Since patient will be doing pool only, we held on any land HEP exercises.   ASSESSMENT:  CLINICAL IMPRESSION: Patient is a 57 y.o. female who was seen today for physical therapy evaluation and treatment for multiple chronic conditions, primarily Ehlers Danlos syndrome.  She presents with mulitple complaints of joint issues and pain but fairly normal strength and function.  Her objective exam was limited due to her history.  Her functional tests were excellent and strength is actually good.  Her primary issue tends to be her feeling of joint instability and pain.  She has not tolerated much in the way of land PT in her past with exception of her pilates class.  She is quite committed to this.  She may experience some  relief of her pain symptoms with pool therapy.  She may then be able to join [employer] or obtain membership to pool to continue this program after DC.   OBJECTIVE IMPAIRMENTS: decreased activity tolerance, impaired perceived functional ability, and pain.   ACTIVITY LIMITATIONS: carrying, lifting, bending, standing, squatting, sleeping, stairs, transfers, bed mobility, continence, bathing, toileting, dressing, self feeding, reach over head, and hygiene/grooming  PARTICIPATION LIMITATIONS: meal prep, cleaning, laundry, driving, shopping, community activity, occupation, yard work, and church  PERSONAL FACTORS: Behavior pattern, Past/current experiences, Profession, Time since onset of injury/illness/exacerbation, and 3+ comorbidities: see history POTS, Mast cell disorder, and multiple others  are  also affecting patient's functional outcome.   REHAB POTENTIAL: Poor due to chronic nature of her condition  CLINICAL DECISION MAKING: Unstable/unpredictable  EVALUATION COMPLEXITY: High   GOALS: Goals reviewed with patient? Yes  SHORT TERM GOALS: Target date: 11/11/2022  25% improvement in pain Baseline: Goal status: INITIAL  2.  Patient to report pool as an option for managing her E.D. Baseline:  Goal status: INITIAL   LONG TERM GOALS: Target date: 12/09/2022   Patient to report 30% improvement in pain Baseline:  Goal status: INITIAL  2.  Patient to be able to join pool program for continued pain control and management of symptoms Baseline:  Goal status: INITIAL  PLAN:  PT FREQUENCY: 1x/week  PT DURATION: 8 weeks  PLANNED INTERVENTIONS: Therapeutic exercises, Therapeutic activity, Neuromuscular re-education, Balance training, Gait training, Patient/Family education, Self Care, Joint mobilization, Stair training, DME instructions, Aquatic Therapy, Manual therapy, and Re-evaluation  PLAN FOR NEXT SESSION: Aquatics only 1 time per week x 8 weeks    Crislyn Willbanks B. Fanta Wimberley,  PT 10/14/22 4:58 PM [facility] [address], [address] [place], [place] [address] Phone # [phone] Fax [phone]

## 2022-10-16 ENCOUNTER — Encounter: Payer: Self-pay | Admitting: Sports Medicine

## 2022-10-18 MED FILL — XOLAIR 300 MG/2 ML SUBCUTANEOUS AUTO-INJECTOR: SUBCUTANEOUS | 28 days supply | Qty: 2 | Fill #2

## 2022-10-19 ENCOUNTER — Ambulatory Visit: Payer: BC Managed Care – PPO | Attending: Sports Medicine | Admitting: Physical Therapy

## 2022-10-19 ENCOUNTER — Encounter: Payer: Self-pay | Admitting: Physical Therapy

## 2022-10-19 DIAGNOSIS — Q796 Ehlers-Danlos syndrome, unspecified: Secondary | ICD-10-CM

## 2022-10-19 DIAGNOSIS — M6281 Muscle weakness (generalized): Secondary | ICD-10-CM | POA: Diagnosis not present

## 2022-10-19 DIAGNOSIS — R252 Cramp and spasm: Secondary | ICD-10-CM

## 2022-10-19 DIAGNOSIS — R262 Difficulty in walking, not elsewhere classified: Secondary | ICD-10-CM | POA: Diagnosis not present

## 2022-10-19 NOTE — Therapy (Signed)
OUTPATIENT PHYSICAL THERAPY LOWER EXTREMITY    Patient Name: Marissa [name] MRN: [id number] DOB:May 06, 1965, 57 y.o., female Today's Date: 10/19/2022  END OF SESSION:  PT End of Session - 10/19/22 2123     Visit Number 2    Date for PT Re-Evaluation 12/09/22    Authorization Type [employer]    PT Start Time 0845    PT Stop Time 0930    PT Time Calculation (min) 45 min    Activity Tolerance Patient tolerated treatment well    Behavior During Therapy [facility] for tasks assessed/performed             Past Medical History:  Diagnosis Date   Ehlers-Danlos syndrome    History of pituitary tumor    Ocular migraine    Past Surgical History:  Procedure Laterality Date   KNEE SURGERY N/A 2012   SHOULDER SURGERY N/A 2009   UTERINE FIBROID SURGERY N/A    Patient Active Problem List   Diagnosis Date Noted   Subluxation 04/07/2022   Right hip subluxation, sequela 02/15/2022   Asthma 11/11/2021   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   Finger pain, right 07/15/2021   Knee pain, left 04/25/2019   Bicipital tendonitis of left shoulder 04/25/2019   Varicose veins of bilateral lower extremities with other complications 10/25/2018   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   POTS (postural orthostatic tachycardia syndrome) 08/29/2017   Chronic pain syndrome 08/29/2017   Chronic insomnia 08/29/2017   Wrist pain, acute 06/02/2016   Enthesopathy of right shoulder 01/28/2016   Right shoulder pain 01/28/2016   Family history of early CAD 11/30/2015   Cervical disc disorder with radiculopathy of cervical region 05/13/2014   Chronic right SI joint pain 08/28/2013   Hip pain, right 06/25/2013   Elbow pain, right 02/13/2013   Bunionette 01/07/2013   Ehlers-Danlos disease 11/08/2012   Unspecified visual disturbance 04/19/2012   Pituitary adenoma with extrasellar extension (HCC) 04/19/2012    PCP: Maurice Small, MD   REFERRING PROVIDER: Enid Baas, MD  REFERRING DIAG: [phone] (ICD-10-CM) -  Ehlers-Danlos disease M25.551 (ICD-10-CM) - Hip pain, right M79.18 (ICD-10-CM) - Myalgia, multiple sites  THERAPY DIAG:  Muscle weakness (generalized)  Difficulty in walking, not elsewhere classified  Cramp and spasm  Ehlers-Danlos syndrome  Rationale for Evaluation and Treatment: Rehabilitation  ONSET DATE: 2005 (1990 diagnosed with POTS)  SUBJECTIVE:   SUBJECTIVE STATEMENT: My RT hip subluxes and holding my head up beyond 15-30 minutes can be very painful with Lt arm symptoms.   PERTINENT HISTORY: POTS as well PAIN:  Are you having pain? Yes: NPRS scale: 6-9/10 Pain location: all over Pain description: aching, numbness Aggravating factors: everything Relieving factors: once in a while,  pilates helps, she does this on a regular basis  PRECAUTIONS: Other: ED , POTS, "Mast cell activation disorder"  RED FLAGS: None   WEIGHT BEARING RESTRICTIONS: No  FALLS:  Has patient fallen in last 6 months? Yes. Number of falls 3 (1 on lyrica, and the other 2 from hip and knee subluxations)  LIVING ENVIRONMENT: Lives with: lives alone Lives in: House/apartment Has upstairs but could live downstairs if needed  OCCUPATION: Child psychotherapist, Engineer, structural (lots of driving, standing, giving talks)  PLOF: Independent, Independent with basic ADLs, Independent with household mobility without device, Independent with community mobility without device, Independent with gait, and Independent with transfers  PATIENT GOALS: decreased pain  NEXT MD VISIT: prn  OBJECTIVE:   DIAGNOSTIC FINDINGS: Multiple cervical spine and many other  diagnostics on this patient.  See in chart review.    COGNITION: Overall cognitive status: Within functional limits for tasks assessed     SENSATION: WFL  MUSCLE LENGTH: Hamstrings: Right approx 45 deg; Left approx 50 deg Thomas test: Right neg; Left neg  POSTURE: No Significant postural limitations  LOWER EXTREMITY ROM:  WFL  (left hip slightly limited)  LOWER EXTREMITY MMT:  Generally 4+ to 5/5   FUNCTIONAL TESTS:  5 times sit to stand: 6.61 sec Timed up and go (TUG): 6.55 sec  GAIT: Distance walked: 30 Assistive device utilized: None Level of assistance: Complete Independence Comments: normal   TODAY'S TREATMENT:    10/19/22: Pt arrives for aquatic physical therapy. Treatment took place in 3.5-5.5 feet of water. Water temperature was 91 degrees . Pt entered the pool via stairs reciprocally but slowly with use of bil rails. Pt requires buoyancy of water for support and to offload joints with strengthening exercises.  Seated water bench with 75% submersion Pt performed seated LE AROM exercises 10x in all planes with concurrent education of water principles and how we would use them. Pt verbally understood. 75% depth water walking 4x in each direction with 1 minute seated rest break in between to manage RT hip. Standing against wall with isometric arm press into the water 5 sec glute & TA contraction 5 sec 5x. Seated TA contraction 5x with head supported on the wall.                                                                                                                        DATE: 10/14/23 Initial eval completed and suggested pool only    PATIENT EDUCATION:  Education details: explained that pool may allow for more ease of movement but will not likely help with joint stability  Person educated: Patient Education method: Explanation Education comprehension: verbalized understanding  HOME EXERCISE PROGRAM: Did not issue, patient speaks of multiple attempts at exercises for stability.  She feels that no one understands her condition well.  She described exercises that evaluating PT would have recommended but she felt these were contraindicated for her.  The only person she has had a little success with is her Gaffer.  Since patient will be doing pool only, we held on any land HEP  exercises.   ASSESSMENT:  CLINICAL IMPRESSION:Pt tolerated first aquatic treatment seeming well. Level kept low secondary to EDS diagnosis and we will monitor her aquatic progression based on her tolerance. No evidence of RT hip sublux or significant pain increases. Pt wants to be able to perform back to back activities without complete energy crash.   OBJECTIVE IMPAIRMENTS: decreased activity tolerance, impaired perceived functional ability, and pain.   ACTIVITY LIMITATIONS: carrying, lifting, bending, standing, squatting, sleeping, stairs, transfers, bed mobility, continence, bathing, toileting, dressing, self feeding, reach over head, and hygiene/grooming  PARTICIPATION LIMITATIONS: meal prep, cleaning, laundry, driving, shopping, community activity, occupation, yard work, and church  PERSONAL FACTORS: Behavior pattern, Past/current experiences,  Profession, Time since onset of injury/illness/exacerbation, and 3+ comorbidities: see history POTS, Mast cell disorder, and multiple others  are also affecting patient's functional outcome.   REHAB POTENTIAL: Poor due to chronic nature of her condition  CLINICAL DECISION MAKING: Unstable/unpredictable  EVALUATION COMPLEXITY: High   GOALS: Goals reviewed with patient? Yes  SHORT TERM GOALS: Target date: 11/11/2022  25% improvement in pain Baseline: Goal status: INITIAL  2.  Patient to report pool as an option for managing her E.D. Baseline:  Goal status: INITIAL   LONG TERM GOALS: Target date: 12/09/2022   Patient to report 30% improvement in pain Baseline:  Goal status: INITIAL  2.  Patient to be able to join pool program for continued pain control and management of symptoms Baseline:  Goal status: INITIAL  PLAN:  PT FREQUENCY: 1x/week  PT DURATION: 8 weeks  PLANNED INTERVENTIONS: Therapeutic exercises, Therapeutic activity, Neuromuscular re-education, Balance training, Gait training, Patient/Family education, Self  Care, Joint mobilization, Stair training, DME instructions, Aquatic Therapy, Manual therapy, and Re-evaluation  PLAN FOR NEXT SESSION: Aquatics only 1 time per week x 8 weeks   Ane Payment, PTA 10/19/22 9:25 PM    [facility] [address] [place], [place] [address] Phone # [phone] Fax [phone]

## 2022-10-26 ENCOUNTER — Ambulatory Visit (INDEPENDENT_AMBULATORY_CARE_PROVIDER_SITE_OTHER): Payer: BC Managed Care – PPO | Admitting: Sports Medicine

## 2022-10-26 VITALS — BP 120/70 | Ht 58.5 in | Wt 97.0 lb

## 2022-10-26 DIAGNOSIS — S73001A Unspecified subluxation of right hip, initial encounter: Secondary | ICD-10-CM | POA: Diagnosis not present

## 2022-10-26 DIAGNOSIS — S73001S Unspecified subluxation of right hip, sequela: Secondary | ICD-10-CM | POA: Diagnosis not present

## 2022-10-26 DIAGNOSIS — Q796 Ehlers-Danlos syndrome, unspecified: Secondary | ICD-10-CM | POA: Diagnosis not present

## 2022-10-26 DIAGNOSIS — F4323 Adjustment disorder with mixed anxiety and depressed mood: Secondary | ICD-10-CM | POA: Diagnosis not present

## 2022-10-26 NOTE — Assessment & Plan Note (Signed)
The right hip has been subluxing for a few months now and I believe this is destabilize the right hemipelvis and the SI joint area Currently I get more motion in her entire right hemipelvis  Plan is to keep up the aqua therapy for 8 sessions She has tried 1 session so far and that did feel improved Keep up medications which include low-dose naltrexone 6 mg twice a day and Voltaren 50 mg twice a day  She is using the diazepam more frequently at low-dose but this has helped her muscle spasm better than any other medications  Fitted today for a lumbar support to stabilize the excess pelvic motion She will use this over her clothing so as not to trigger skin irritation  Recheck in 6 to 8 weeks to see if she responds to these therapeutic changes

## 2022-10-26 NOTE — Progress Notes (Signed)
Chief complaint right hip and SI joint pain issues  Patient with Ehlers-Danlos syndrome who has been having an increase in musculoskeletal pain over the last several months.  This started with subluxations of the right hip.  Over the last few visits it has involved more her right SI joint.  She states that now the right leg feels unstable if she tries to stand on 1 leg.  She has actually had a couple times where it buckled and caused her to fall.  We tried shockwave to her right low back and hip area but she did not get any benefit We tried trigger point injections along the painful pathway on the iliac crest and these were not beneficial She has been unable to get to Pilates over the last 6 weeks or so  Pain level is increased above her baseline but she does get some relief with her low-dose naltrexone and with the Voltaren dose that she uses  She tried Lyrica but was unable to tolerate the side effects  Physical exam Pleasant white female in no acute distress BP 120/70   Ht 4' 10.5" (1.486 m)   Wt 97 lb (44 kg)   BMI 19.93 kg/m   Tenderness to palpation along the right SI joint and to some extent along the iliac crest Range of motion is full in both hips External rotation of the right hip causes clicking at the joint but more pain over the right SI joint Pelvic rock reveals that the SI joints both move With any significant pelvic pressure there is excess motion of the right SI joint and hemipelvis The left is significantly less mobile  Neurologically she appears intact with the ability to walk on heels toes, balance on 1 foot and do cerebellar testing

## 2022-11-02 ENCOUNTER — Ambulatory Visit: Payer: BC Managed Care – PPO | Admitting: Physical Therapy

## 2022-11-02 ENCOUNTER — Encounter: Payer: Self-pay | Admitting: Physical Therapy

## 2022-11-02 DIAGNOSIS — R262 Difficulty in walking, not elsewhere classified: Secondary | ICD-10-CM

## 2022-11-02 DIAGNOSIS — Q796 Ehlers-Danlos syndrome, unspecified: Secondary | ICD-10-CM | POA: Diagnosis not present

## 2022-11-02 DIAGNOSIS — R252 Cramp and spasm: Secondary | ICD-10-CM

## 2022-11-02 DIAGNOSIS — F4323 Adjustment disorder with mixed anxiety and depressed mood: Secondary | ICD-10-CM | POA: Diagnosis not present

## 2022-11-02 DIAGNOSIS — M6281 Muscle weakness (generalized): Secondary | ICD-10-CM | POA: Diagnosis not present

## 2022-11-02 NOTE — Therapy (Signed)
OUTPATIENT PHYSICAL THERAPY LOWER EXTREMITY    Patient Name: Marissa [name] MRN: [id number] DOB:10-13-1965, 57 y.o., female Today's Date: 11/02/2022  END OF SESSION:  PT End of Session - 11/02/22 1934     Visit Number 3    Date for PT Re-Evaluation 12/09/22    Authorization Type BCBS    PT Start Time 1145    PT Stop Time 1230    PT Time Calculation (min) 45 min    Activity Tolerance Patient tolerated treatment well    Behavior During Therapy WFL for tasks assessed/performed             Past Medical History:  Diagnosis Date   Ehlers-Danlos syndrome    History of pituitary tumor    Ocular migraine    Past Surgical History:  Procedure Laterality Date   KNEE SURGERY N/A 2012   SHOULDER SURGERY N/A 2009   UTERINE FIBROID SURGERY N/A    Patient Active Problem List   Diagnosis Date Noted   Subluxation 04/07/2022   Right hip subluxation, sequela 02/15/2022   Asthma 11/11/2021   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   Finger pain, right 07/15/2021   Knee pain, left 04/25/2019   Bicipital tendonitis of left shoulder 04/25/2019   Varicose veins of bilateral lower extremities with other complications 10/25/2018   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   POTS (postural orthostatic tachycardia syndrome) 08/29/2017   Chronic pain syndrome 08/29/2017   Chronic insomnia 08/29/2017   Wrist pain, acute 06/02/2016   Enthesopathy of right shoulder 01/28/2016   Right shoulder pain 01/28/2016   Family history of early CAD 11/30/2015   Cervical disc disorder with radiculopathy of cervical region 05/13/2014   Chronic right SI joint pain 08/28/2013   Hip pain, right 06/25/2013   Elbow pain, right 02/13/2013   Bunionette 01/07/2013   Ehlers-Danlos disease 11/08/2012   Unspecified visual disturbance 04/19/2012   Pituitary adenoma with extrasellar extension (HCC) 04/19/2012    PCP: Maurice Small, MD   REFERRING PROVIDER: Enid Baas, MD  REFERRING DIAG: [phone] (ICD-10-CM) -  Ehlers-Danlos disease M25.551 (ICD-10-CM) - Hip pain, right M79.18 (ICD-10-CM) - Myalgia, multiple sites  THERAPY DIAG:  Muscle weakness (generalized)  Difficulty in walking, not elsewhere classified  Cramp and spasm  Ehlers-Danlos syndrome  Rationale for Evaluation and Treatment: Rehabilitation  ONSET DATE: 2005 (1990 diagnosed with POTS)  SUBJECTIVE:   SUBJECTIVE STATEMENT: I did ok after my first aquatic treatment. RT hip was a little sore but after some thought I do not think it was from the exercises, it was just regular soreness. Pt reports a couple "soft falls" since being seen last.   PERTINENT HISTORY: POTS as well PAIN:  Are you having pain? Yes: NPRS scale: 6-9/10 Pain location: all over Pain description: aching, numbness Aggravating factors: everything Relieving factors: once in a while,  pilates helps, she does this on a regular basis  PRECAUTIONS: Other: ED , POTS, "Mast cell activation disorder"  RED FLAGS: None   WEIGHT BEARING RESTRICTIONS: No  FALLS:  Has patient fallen in last 6 months? Yes. Number of falls 3 (1 on lyrica, and the other 2 from hip and knee subluxations)  LIVING ENVIRONMENT: Lives with: lives alone Lives in: House/apartment Has upstairs but could live downstairs if needed  OCCUPATION: Child psychotherapist, Engineer, structural (lots of driving, standing, giving talks)  PLOF: Independent, Independent with basic ADLs, Independent with household mobility without device, Independent with community mobility without device, Independent with gait, and Independent with transfers  PATIENT GOALS: decreased pain  NEXT MD VISIT: prn  OBJECTIVE:   DIAGNOSTIC FINDINGS: Multiple cervical spine and many other diagnostics on this patient.  See in chart review.    COGNITION: Overall cognitive status: Within functional limits for tasks assessed     SENSATION: WFL  MUSCLE LENGTH: Hamstrings: Right approx 45 deg; Left approx 50  deg Thomas test: Right neg; Left neg  POSTURE: No Significant postural limitations  LOWER EXTREMITY ROM:  WFL (left hip slightly limited)  LOWER EXTREMITY MMT:  Generally 4+ to 5/5   FUNCTIONAL TESTS:  5 times sit to stand: 6.61 sec Timed up and go (TUG): 6.55 sec  GAIT: Distance walked: 30 Assistive device utilized: None Level of assistance: Complete Independence Comments: normal   TODAY'S TREATMENT:   11/02/22: Pt arrives for aquatic physical therapy. Treatment took place in 3.5-5.5 feet of water. Water temperature was 91 degrees . Pt entered the pool via stairs reciprocally but slowly with use of bil rails. Pt requires buoyancy of water for support and to offload joints with strengthening exercises.  Seated water bench with 75% submersion Pt performed seated LE AROM exercises 10x2 in all planes with concurrent discussion of current status and how she did after her first session 2 weeks ago. 75% depth water walking 6x forward then 4x in the other directions with 2 minute seated rest break in between to manage RT hip pain. Standing against wall with isometric arm press into the water 5 sec glute & TA contraction 5 sec 5x2. Seated TA contraction 5x with head supported on the wall. Static standing with arm forward/back 2x5 VC to keep trunk steady and monitoring position of pelvis. Trial of small forward plank with hands on pool deck: hold for 5 sec, take one arm off then try to use back extensors to stand back upright. 6x, but difficult  to use correct muscles.                                                  10/19/22: Pt arrives for aquatic physical therapy. Treatment took place in 3.5-5.5 feet of water. Water temperature was 91 degrees . Pt entered the pool via stairs reciprocally but slowly with use of bil rails. Pt requires buoyancy of water for support and to offload joints with strengthening exercises.  Seated water bench with 75% submersion Pt performed seated LE AROM exercises  10x in all planes with concurrent education of water principles and how we would use them. Pt verbally understood. 75% depth water walking 4x in each direction with 1 minute seated rest break in between to manage RT hip. Standing against wall with isometric arm press into the water 5 sec glute & TA contraction 5 sec 5x. Seated TA contraction 5x with head supported on the wall.  DATE: 10/14/23 Initial eval completed and suggested pool only    PATIENT EDUCATION:  Education details: explained that pool may allow for more ease of movement but will not likely help with joint stability  Person educated: Patient Education method: Explanation Education comprehension: verbalized understanding  HOME EXERCISE PROGRAM: Did not issue, patient speaks of multiple attempts at exercises for stability.  She feels that no one understands her condition well.  She described exercises that evaluating PT would have recommended but she felt these were contraindicated for her.  The only person she has had a little success with is her Gaffer.  Since patient will be doing pool only, we held on any land HEP exercises.   ASSESSMENT:  CLINICAL IMPRESSION:Pt tolerated her initial aquatic session well. Rt hip was not more symptomatic. Some initial increase in hip pain after we began walking, but this subsided greatly after appropriate rest breaks. Very small progressions today but that is to be expected.  OBJECTIVE IMPAIRMENTS: decreased activity tolerance, impaired perceived functional ability, and pain.   ACTIVITY LIMITATIONS: carrying, lifting, bending, standing, squatting, sleeping, stairs, transfers, bed mobility, continence, bathing, toileting, dressing, self feeding, reach over head, and hygiene/grooming  PARTICIPATION LIMITATIONS: meal prep, cleaning, laundry, driving, shopping, community  activity, occupation, yard work, and church  PERSONAL FACTORS: Behavior pattern, Past/current experiences, Profession, Time since onset of injury/illness/exacerbation, and 3+ comorbidities: see history POTS, Mast cell disorder, and multiple others  are also affecting patient's functional outcome.   REHAB POTENTIAL: Poor due to chronic nature of her condition  CLINICAL DECISION MAKING: Unstable/unpredictable  EVALUATION COMPLEXITY: High   GOALS: Goals reviewed with patient? Yes  SHORT TERM GOALS: Target date: 11/11/2022  25% improvement in pain Baseline: Goal status: INITIAL  2.  Patient to report pool as an option for managing her E.D. Baseline:  Goal status: Goal met 11/02/22   LONG TERM GOALS: Target date: 12/09/2022   Patient to report 30% improvement in pain Baseline:  Goal status: INITIAL  2.  Patient to be able to join pool program for continued pain control and management of symptoms Baseline:  Goal status: INITIAL  PLAN:  PT FREQUENCY: 1x/week  PT DURATION: 8 weeks  PLANNED INTERVENTIONS: Therapeutic exercises, Therapeutic activity, Neuromuscular re-education, Balance training, Gait training, Patient/Family education, Self Care, Joint mobilization, Stair training, DME instructions, Aquatic Therapy, Manual therapy, and Re-evaluation  PLAN FOR NEXT SESSION: Aquatics only 1 time per week x 8 weeks   Ane Payment, PTA 11/02/22 7:35 PM    [facility] [address], [address] [place], [place] [address] Phone # [phone] Fax [phone]

## 2022-11-04 ENCOUNTER — Other Ambulatory Visit (HOSPITAL_COMMUNITY)
Admission: RE | Admit: 2022-11-04 | Discharge: 2022-11-04 | Disposition: A | Discharge status: Home / Self Care | Payer: BC Managed Care – PPO | Source: Ambulatory Visit | Attending: Oncology | Admitting: Oncology

## 2022-11-04 DIAGNOSIS — Z006 Encounter for examination for normal comparison and control in clinical research program: Secondary | ICD-10-CM | POA: Insufficient documentation

## 2022-11-07 NOTE — Unmapped (Signed)
[facility] Refill Coordination Note    Natasha Copeland, DOB: October 20, 1965  Phone: [phone] (home)       All above HIPAA information was verified with patient.         11/07/2022    12:18 PM   Specialty Rx Medication Refill Questionnaire   Which Medications would you like refilled and shipped? Xolair (I have no supply in hand)   Please list all current allergies: Doxipen   Have you missed any doses in the last 30 days? No   Have you had any changes to your medication(s) since your last refill? No   How many days remaining of each medication do you have at home? 0   If receiving an injectable medication, next injection date is 11/21/2022   Have you experienced any side effects in the last 30 days? No   Please enter the full address (street address, city, state, zip code) where you would like your medication(s) to be delivered to. [address] [place], [place] [address]   Please specify on which day you would like your medication(s) to arrive. Note: if you need your medication(s) within 3 days, please call the pharmacy to schedule your order at [phone]  11/16/2022   Has your insurance changed since your last refill? Yes   If YES, please enter your new insurance information (include BIN, PCN, RX Group, and Member ID). BC/BS/ Membership # [id number] Group # [id number]   Would you like a pharmacist to call you to discuss your medication(s)? No   Do you require a signature for your package? (Note: if we are billing Medicare Part B or your order contains a controlled substance, we will require a signature) No   Additional Comments: BC/BS is still my insurance but Oct 1, new cards were issued with a new sunscriber/membership number         Completed refill call assessment today to schedule patient's medication shipment from the [facility] [phone]).  All relevant notes have been reviewed.       Confirmed patient received a Conservation officer, historic buildings and a Surveyor, mining with first shipment. The patient will receive a drug information handout for each medication shipped and additional FDA Medication Guides as required.         REFERRAL TO PHARMACIST     Referral to the pharmacist: Not needed      [facility]     Shipping address confirmed in Epic.     Delivery Scheduled: Yes, Expected medication delivery date: 11/16/22.     Medication will be delivered via UPS to the prescription address in Epic WAM.    Oliva Bustard, PharmD   [facility] Specialty Pharmacist

## 2022-11-08 NOTE — Therapy (Unsigned)
OUTPATIENT PHYSICAL THERAPY LOWER EXTREMITY    Patient Name: Marissa [name] MRN: [id number] DOB:1965/05/29, 57 y.o., female Today's Date: 11/09/2022  END OF SESSION:  PT End of Session - 11/09/22 0945     Visit Number 4    Date for PT Re-Evaluation 12/09/22    Authorization Type BCBS    PT Start Time 0845    PT Stop Time 0930    PT Time Calculation (min) 45 min    Activity Tolerance Patient tolerated treatment well    Behavior During Therapy [facility] for tasks assessed/performed              Past Medical History:  Diagnosis Date   Ehlers-Danlos syndrome    History of pituitary tumor    Ocular migraine    Past Surgical History:  Procedure Laterality Date   KNEE SURGERY N/A 2012   SHOULDER SURGERY N/A 2009   UTERINE FIBROID SURGERY N/A    Patient Active Problem List   Diagnosis Date Noted   Subluxation 04/07/2022   Right hip subluxation, sequela 02/15/2022   Asthma 11/11/2021   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   Finger pain, right 07/15/2021   Knee pain, left 04/25/2019   Bicipital tendonitis of left shoulder 04/25/2019   Varicose veins of bilateral lower extremities with other complications 10/25/2018   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   POTS (postural orthostatic tachycardia syndrome) 08/29/2017   Chronic pain syndrome 08/29/2017   Chronic insomnia 08/29/2017   Wrist pain, acute 06/02/2016   Enthesopathy of right shoulder 01/28/2016   Right shoulder pain 01/28/2016   Family history of early CAD 11/30/2015   Cervical disc disorder with radiculopathy of cervical region 05/13/2014   Chronic right SI joint pain 08/28/2013   Hip pain, right 06/25/2013   Elbow pain, right 02/13/2013   Bunionette 01/07/2013   Ehlers-Danlos disease 11/08/2012   Unspecified visual disturbance 04/19/2012   Pituitary adenoma with extrasellar extension (HCC) 04/19/2012    PCP: Maurice Small, MD   REFERRING PROVIDER: Enid Baas, MD  REFERRING DIAG: [phone] (ICD-10-CM)  - Ehlers-Danlos disease M25.551 (ICD-10-CM) - Hip pain, right M79.18 (ICD-10-CM) - Myalgia, multiple sites  THERAPY DIAG:  Muscle weakness (generalized)  Difficulty in walking, not elsewhere classified  Cramp and spasm  Ehlers-Danlos syndrome  Rationale for Evaluation and Treatment: Rehabilitation  ONSET DATE: 2005 (1990 diagnosed with POTS)  SUBJECTIVE:   SUBJECTIVE STATEMENT: I am tolerating the water exercises fine. I am having difficulty remote teaching and especially if I have 2 events in one day. RT SI has been pretty painful.  PERTINENT HISTORY: POTS as well PAIN:  Are you having pain? Yes: NPRS scale: 6-9/10 Pain location: all over RT SI Pain description: aching, numbness Aggravating factors: everything Relieving factors: once in a while,  pilates helps, she does this on a regular basis  PRECAUTIONS: Other: ED , POTS, "Mast cell activation disorder"  RED FLAGS: None   WEIGHT BEARING RESTRICTIONS: No  FALLS:  Has patient fallen in last 6 months? Yes. Number of falls 3 (1 on lyrica, and the other 2 from hip and knee subluxations)  LIVING ENVIRONMENT: Lives with: lives alone Lives in: House/apartment Has upstairs but could live downstairs if needed  OCCUPATION: Child psychotherapist, Engineer, structural (lots of driving, standing, giving talks)  PLOF: Independent, Independent with basic ADLs, Independent with household mobility without device, Independent with community mobility without device, Independent with gait, and Independent with transfers  PATIENT GOALS: decreased pain  NEXT MD VISIT: prn  OBJECTIVE:   DIAGNOSTIC FINDINGS: Multiple cervical spine and many other diagnostics on this patient.  See in chart review.    COGNITION: Overall cognitive status: Within functional limits for tasks assessed     SENSATION: WFL  MUSCLE LENGTH: Hamstrings: Right approx 45 deg; Left approx 50 deg Thomas test: Right neg; Left neg  POSTURE: No  Significant postural limitations  LOWER EXTREMITY ROM:  WFL (left hip slightly limited)  LOWER EXTREMITY MMT:  Generally 4+ to 5/5   FUNCTIONAL TESTS:  5 times sit to stand: 6.61 sec Timed up and go (TUG): 6.55 sec  GAIT: Distance walked: 30 Assistive device utilized: None Level of assistance: Complete Independence Comments: normal   TODAY'S TREATMENT:   11/09/22:Pt arrives for aquatic physical therapy. Treatment took place in 3.5-5.5 feet of water. Water temperature was 91 degrees . Pt entered the pool via stairs reciprocally but slowly with use of bil rails. Pt requires buoyancy of water for support and to offload joints with strengthening exercises.  Seated water bench with 75% submersion Pt performed seated LE AROM exercises 10x2 in all planes with concurrent discussion of current status and how she did after her first last session. 75% depth water walking 6x in all directions with trial of SI belt with 2-3 minute seated rest break in between to manage RT hip and SI pain. Standing in 75% depth with arm flex/ext with glute & TA contraction 10x2. Small forward plank with hands on pool deck: hold for 5 sec, take one arm off then try to use back extensors to stand back upright. 6x.  11/02/22: Pt arrives for aquatic physical therapy. Treatment took place in 3.5-5.5 feet of water. Water temperature was 91 degrees . Pt entered the pool via stairs reciprocally but slowly with use of bil rails. Pt requires buoyancy of water for support and to offload joints with strengthening exercises.  Seated water bench with 75% submersion Pt performed seated LE AROM exercises 10x2 in all planes with concurrent discussion of current status and how she did after her first session 2 weeks ago. 75% depth water walking 6x forward then 4x in the other directions with 2 minute seated rest break in between to manage RT hip pain. Standing against wall with isometric arm press into the water 5 sec glute & TA  contraction 5 sec 5x2. Seated TA contraction 5x with head supported on the wall. Static standing with arm forward/back 2x5 VC to keep trunk steady and monitoring position of pelvis. Trial of small forward plank with hands on pool deck: hold for 5 sec, take one arm off then try to use back extensors to stand back upright. 6x, but difficult  to use correct muscles.                                                  10/19/22: Pt arrives for aquatic physical therapy. Treatment took place in 3.5-5.5 feet of water. Water temperature was 91 degrees . Pt entered the pool via stairs reciprocally but slowly with use of bil rails. Pt requires buoyancy of water for support and to offload joints with strengthening exercises.  Seated water bench with 75% submersion Pt performed seated LE AROM exercises 10x in all planes with concurrent education of water principles and how we would use them. Pt verbally understood. 75% depth water walking 4x in each  direction with 1 minute seated rest break in between to manage RT hip. Standing against wall with isometric arm press into the water 5 sec glute & TA contraction 5 sec 5x. Seated TA contraction 5x with head supported on the wall.                                                                                                                        DATE: 10/14/23 Initial eval completed and suggested pool only    PATIENT EDUCATION:  Education details: explained that pool may allow for more ease of movement but will not likely help with joint stability  Person educated: Patient Education method: Explanation Education comprehension: verbalized understanding  HOME EXERCISE PROGRAM: Did not issue, patient speaks of multiple attempts at exercises for stability.  She feels that no one understands her condition well.  She described exercises that evaluating PT would have recommended but she felt these were contraindicated for her.  The only person she has had a little success with  is her Gaffer.  Since patient will be doing pool only, we held on any land HEP exercises.   ASSESSMENT:  CLINICAL IMPRESSION:Pt arrives to aquatic PT with moderate RT SI pain. This was well managed throughout the session with use of SI belt and appropriate rest breaks. 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  OBJECTIVE IMPAIRMENTS: decreased activity tolerance, impaired perceived functional ability, and pain.   ACTIVITY LIMITATIONS: carrying, lifting, bending, standing, squatting, sleeping, stairs, transfers, bed mobility, continence, bathing, toileting, dressing, self feeding, reach over head, and hygiene/grooming  PARTICIPATION LIMITATIONS: meal prep, cleaning, laundry, driving, shopping, community activity, occupation, yard work, and church  PERSONAL FACTORS: Behavior pattern, Past/current experiences, Profession, Time since onset of injury/illness/exacerbation, and 3+ comorbidities: see history POTS, Mast cell disorder, and multiple others  are also affecting patient's functional outcome.   REHAB POTENTIAL: Poor due to chronic nature of her condition  CLINICAL DECISION MAKING: Unstable/unpredictable  EVALUATION COMPLEXITY: High   GOALS: Goals reviewed with patient? Yes  SHORT TERM GOALS: Target date: 11/11/2022  25% improvement in pain Baseline: Goal status: INITIAL  2.  Patient to report pool as an option for managing her E.D. Baseline:  Goal status: Goal met 11/02/22   LONG TERM GOALS: Target date: 12/09/2022   Patient to report 30% improvement in pain Baseline:  Goal status: INITIAL  2.  Patient to be able to join pool program for continued pain control and management of symptoms Baseline:  Goal status: INITIAL  PLAN:  PT FREQUENCY: 1x/week  PT DURATION: 8 weeks  PLANNED INTERVENTIONS:  Therapeutic exercises, Therapeutic activity, Neuromuscular re-education, Balance training, Gait training, Patient/Family education, Self Care, Joint mobilization, Stair training, DME instructions, Aquatic Therapy, Manual therapy, and Re-evaluation  PLAN FOR NEXT SESSION: Aquatics only 1 time per week x 8 weeks   Ane Payment,  PTA 11/09/22 9:20 PM    [facility] [address], [address] [place], [place] [address] Phone # [phone] Fax [phone]

## 2022-11-09 ENCOUNTER — Ambulatory Visit: Payer: BC Managed Care – PPO | Admitting: Physical Therapy

## 2022-11-09 ENCOUNTER — Encounter: Payer: Self-pay | Admitting: Physical Therapy

## 2022-11-09 DIAGNOSIS — Q796 Ehlers-Danlos syndrome, unspecified: Secondary | ICD-10-CM | POA: Diagnosis not present

## 2022-11-09 DIAGNOSIS — M6281 Muscle weakness (generalized): Secondary | ICD-10-CM | POA: Diagnosis not present

## 2022-11-09 DIAGNOSIS — R252 Cramp and spasm: Secondary | ICD-10-CM

## 2022-11-09 DIAGNOSIS — R262 Difficulty in walking, not elsewhere classified: Secondary | ICD-10-CM

## 2022-11-15 LAB — HELIX MOLECULAR SCREEN: Genetic Analysis Overall Interpretation: NEGATIVE

## 2022-11-15 MED FILL — XOLAIR 300 MG/2 ML SUBCUTANEOUS AUTO-INJECTOR: SUBCUTANEOUS | 28 days supply | Qty: 2 | Fill #3

## 2022-11-15 NOTE — Therapy (Unsigned)
OUTPATIENT PHYSICAL THERAPY LOWER EXTREMITY    Patient Name: Marissa [name] MRN: [id number] DOB:Sep 05, 1965, 57 y.o., female Today's Date: 11/16/2022  END OF SESSION:  PT End of Session - 11/16/22 1207     Visit Number 5    Date for PT Re-Evaluation 12/09/22    Authorization Type [employer]    PT Start Time 0845    PT Stop Time 0930    PT Time Calculation (min) 45 min    Activity Tolerance Patient tolerated treatment well    Behavior During Therapy [facility] for tasks assessed/performed               Past Medical History:  Diagnosis Date   Ehlers-Danlos syndrome    History of pituitary tumor    Ocular migraine    Past Surgical History:  Procedure Laterality Date   KNEE SURGERY N/A 2012   SHOULDER SURGERY N/A 2009   UTERINE FIBROID SURGERY N/A    Patient Active Problem List   Diagnosis Date Noted   Subluxation 04/07/2022   Right hip subluxation, sequela 02/15/2022   Asthma 11/11/2021   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   Finger pain, right 07/15/2021   Knee pain, left 04/25/2019   Bicipital tendonitis of left shoulder 04/25/2019   Varicose veins of bilateral lower extremities with other complications 10/25/2018   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   POTS (postural orthostatic tachycardia syndrome) 08/29/2017   Chronic pain syndrome 08/29/2017   Chronic insomnia 08/29/2017   Wrist pain, acute 06/02/2016   Enthesopathy of right shoulder 01/28/2016   Right shoulder pain 01/28/2016   Family history of early CAD 11/30/2015   Cervical disc disorder with radiculopathy of cervical region 05/13/2014   Chronic right SI joint pain 08/28/2013   Hip pain, right 06/25/2013   Elbow pain, right 02/13/2013   Bunionette 01/07/2013   Ehlers-Danlos disease 11/08/2012   Unspecified visual disturbance 04/19/2012   Pituitary adenoma with extrasellar extension (HCC) 04/19/2012    PCP: Maurice Small, MD   REFERRING PROVIDER: Enid Baas, MD  REFERRING DIAG: [phone]  (ICD-10-CM) - Ehlers-Danlos disease M25.551 (ICD-10-CM) - Hip pain, right M79.18 (ICD-10-CM) - Myalgia, multiple sites  THERAPY DIAG:  Muscle weakness (generalized)  Difficulty in walking, not elsewhere classified  Cramp and spasm  Ehlers-Danlos syndrome  Rationale for Evaluation and Treatment: Rehabilitation  ONSET DATE: 2005 (1990 diagnosed with POTS)  SUBJECTIVE:   SUBJECTIVE STATEMENT: Pt is pleased that aquatic exercise is not exacerbating her neck and LT arm pain. Hip pain and/or SI pain can increase after pool session but it decreases back to her baseline.  PERTINENT HISTORY: POTS as well PAIN:  Are you having pain? Yes: NPRS scale: 6-9/10 Pain location: all over RT SI Pain description: aching, numbness Aggravating factors: everything Relieving factors: once in a while,  pilates helps, she does this on a regular basis  PRECAUTIONS: Other: ED , POTS, "Mast cell activation disorder"  RED FLAGS: None   WEIGHT BEARING RESTRICTIONS: No  FALLS:  Has patient fallen in last 6 months? Yes. Number of falls 3 (1 on lyrica, and the other 2 from hip and knee subluxations)  LIVING ENVIRONMENT: Lives with: lives alone Lives in: House/apartment Has upstairs but could live downstairs if needed  OCCUPATION: Child psychotherapist, Engineer, structural (lots of driving, standing, giving talks)  PLOF: Independent, Independent with basic ADLs, Independent with household mobility without device, Independent with community mobility without device, Independent with gait, and Independent with transfers  PATIENT GOALS: decreased pain  NEXT  MD VISIT: prn  OBJECTIVE:   DIAGNOSTIC FINDINGS: Multiple cervical spine and many other diagnostics on this patient.  See in chart review.    COGNITION: Overall cognitive status: Within functional limits for tasks assessed     SENSATION: WFL  MUSCLE LENGTH: Hamstrings: Right approx 45 deg; Left approx 50 deg Thomas test: Right neg;  Left neg  POSTURE: No Significant postural limitations  LOWER EXTREMITY ROM:  WFL (left hip slightly limited)  LOWER EXTREMITY MMT:  Generally 4+ to 5/5   FUNCTIONAL TESTS:  5 times sit to stand: 6.61 sec Timed up and go (TUG): 6.55 sec  GAIT: Distance walked: 30 Assistive device utilized: None Level of assistance: Complete Independence Comments: normal   TODAY'S TREATMENT:   11/16/22:Pt arrives for aquatic physical therapy. Treatment took place in 3.5-5.5 feet of water. Water temperature was 91 degrees . Pt entered the pool via stairs reciprocally but slowly with use of bil rails. Pt requires buoyancy of water for support and to offload joints with strengthening exercises.  Seated water bench with 75% submersion Pt performed seated LE AROM exercises 10x2 in all planes with concurrent discussion of current status and how she did after her first last session. 75% depth water walking 8x in all directions with SI belt with 2-3 minute seated rest break in between to manage RT hip and SI pain. Standing in 75% depth with arm flex/ext with glute & TA contraction 10x2. Out of time for plank work today.  11/09/22:Pt arrives for aquatic physical therapy. Treatment took place in 3.5-5.5 feet of water. Water temperature was 91 degrees . Pt entered the pool via stairs reciprocally but slowly with use of bil rails. Pt requires buoyancy of water for support and to offload joints with strengthening exercises.  Seated water bench with 75% submersion Pt performed seated LE AROM exercises 10x2 in all planes with concurrent discussion of current status and how she did after her first last session. 75% depth water walking 6x in all directions with trial of SI belt with 2-3 minute seated rest break in between to manage RT hip and SI pain. Standing in 75% depth with arm flex/ext with glute & TA contraction 10x2. Small forward plank with hands on pool deck: hold for 5 sec, take one arm off then try to use  back extensors to stand back upright. 6x.                                              PATIENT EDUCATION:  Education details: explained that pool may allow for more ease of movement but will not likely help with joint stability  Person educated: Patient Education method: Explanation Education comprehension: verbalized understanding  HOME EXERCISE PROGRAM: Did not issue, patient speaks of multiple attempts at exercises for stability.  She feels that no one understands her condition well.  She described exercises that evaluating PT would have recommended but she felt these were contraindicated for her.  The only person she has had a little success with is her Gaffer.  Since patient will be doing pool only, we held on any land HEP exercises.   ASSESSMENT:  CLINICAL IMPRESSION: Pt has doubled her amount of water walking. Pt has not had any cervical or LT UE pain exercising in the water. Pt reports her RT hip/SI pain does increase post session but returns back to baseline  within a few hours.  OBJECTIVE IMPAIRMENTS: decreased activity tolerance, impaired perceived functional ability, and pain.   ACTIVITY LIMITATIONS: carrying, lifting, bending, standing, squatting, sleeping, stairs, transfers, bed mobility, continence, bathing, toileting, dressing, self feeding, reach over head, and hygiene/grooming  PARTICIPATION LIMITATIONS: meal prep, cleaning, laundry, driving, shopping, community activity, occupation, yard work, and church  PERSONAL FACTORS: Behavior pattern, Past/current experiences, Profession, Time since onset of injury/illness/exacerbation, and 3+ comorbidities: see history POTS, Mast cell disorder, and multiple others  are also affecting patient's functional outcome.   REHAB POTENTIAL: Poor due to chronic nature of her condition  CLINICAL DECISION MAKING: Unstable/unpredictable  EVALUATION COMPLEXITY: High   GOALS: Goals reviewed with patient? Yes  SHORT TERM GOALS:  Target date: 11/11/2022  25% improvement in pain Baseline: Goal status: INITIAL  2.  Patient to report pool as an option for managing her E.D. Baseline:  Goal status: Goal met 11/02/22   LONG TERM GOALS: Target date: 12/09/2022   Patient to report 30% improvement in pain Baseline:  Goal status: INITIAL  2.  Patient to be able to join pool program for continued pain control and management of symptoms Baseline:  Goal status: INITIAL  PLAN:  PT FREQUENCY: 1x/week  PT DURATION: 8 weeks  PLANNED INTERVENTIONS: Therapeutic exercises, Therapeutic activity, Neuromuscular re-education, Balance training, Gait training, Patient/Family education, Self Care, Joint mobilization, Stair training, DME instructions, Aquatic Therapy, Manual therapy, and Re-evaluation  PLAN FOR NEXT SESSION: Aquatics only 1 time per week x 8 weeks   Ane Payment, PTA 11/16/22 10:25 PM    [facility] [address] [place], [place] [address] Phone # [phone] Fax [phone]

## 2022-11-16 ENCOUNTER — Ambulatory Visit: Payer: BC Managed Care – PPO | Admitting: Physical Therapy

## 2022-11-16 ENCOUNTER — Encounter: Payer: Self-pay | Admitting: Physical Therapy

## 2022-11-16 DIAGNOSIS — M6281 Muscle weakness (generalized): Secondary | ICD-10-CM

## 2022-11-16 DIAGNOSIS — R252 Cramp and spasm: Secondary | ICD-10-CM | POA: Diagnosis not present

## 2022-11-16 DIAGNOSIS — R262 Difficulty in walking, not elsewhere classified: Secondary | ICD-10-CM | POA: Diagnosis not present

## 2022-11-16 DIAGNOSIS — Q796 Ehlers-Danlos syndrome, unspecified: Secondary | ICD-10-CM | POA: Diagnosis not present

## 2022-11-18 ENCOUNTER — Other Ambulatory Visit: Payer: Self-pay

## 2022-11-18 DIAGNOSIS — F4323 Adjustment disorder with mixed anxiety and depressed mood: Secondary | ICD-10-CM | POA: Diagnosis not present

## 2022-11-18 MED ORDER — AMBULATORY NON FORMULARY MEDICATION: 2 refills | Status: DC

## 2022-11-22 ENCOUNTER — Other Ambulatory Visit: Payer: Self-pay

## 2022-11-22 MED ORDER — AMBULATORY NON FORMULARY MEDICATION: 2 refills | Status: DC

## 2022-11-23 ENCOUNTER — Ambulatory Visit: Payer: BC Managed Care – PPO | Admitting: Physical Therapy

## 2022-11-23 NOTE — Therapy (Deleted)
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   Finger pain, right 07/15/2021   Knee pain, left 04/25/2019   Bicipital tendonitis of left shoulder 04/25/2019   Varicose veins of bilateral lower extremities with other complications 10/25/2018   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   POTS (postural orthostatic tachycardia syndrome) 08/29/2017   Chronic pain syndrome 08/29/2017   Chronic insomnia 08/29/2017   Wrist pain, acute 06/02/2016   Enthesopathy of right shoulder 01/28/2016   Right shoulder pain 01/28/2016   Family history of early CAD 11/30/2015   Cervical disc disorder with radiculopathy of cervical region 05/13/2014   Chronic right SI joint pain 08/28/2013   Hip pain, right 06/25/2013   Elbow pain, right 02/13/2013   Bunionette 01/07/2013   Ehlers-Danlos disease 11/08/2012   Unspecified visual disturbance 04/19/2012   Pituitary adenoma with extrasellar extension (HCC) 04/19/2012    PCP: Maurice Small, MD   REFERRING PROVIDER: Enid Baas, MD  REFERRING DIAG: [phone] (ICD-10-CM) - Ehlers-Danlos disease M25.551 (ICD-10-CM) - Hip pain, right M79.18 (ICD-10-CM) - Myalgia, multiple sites  THERAPY DIAG:  Muscle weakness (generalized)  Difficulty in walking, not elsewhere classified  Cramp and spasm  Ehlers-Danlos syndrome  Rationale for Evaluation and Treatment: Rehabilitation  ONSET DATE: 2005 (1990  diagnosed with POTS)  SUBJECTIVE:   SUBJECTIVE STATEMENT: Pt is pleased that aquatic exercise is not exacerbating her neck and LT arm pain. Hip pain and/or SI pain can increase after pool session but it decreases back to her baseline.  PERTINENT HISTORY: POTS as well PAIN:  Are you having pain? Yes: NPRS scale: 6-9/10 Pain location: all over RT SI Pain description: aching, numbness Aggravating factors: everything Relieving factors: once in a while,  pilates helps, she does this on a regular basis  PRECAUTIONS: Other: ED , POTS, "Mast cell activation disorder"  RED FLAGS: None   WEIGHT BEARING RESTRICTIONS: No  FALLS:  Has patient fallen in last 6 months? Yes. Number of falls 3 (1 on lyrica, and the other 2 from hip and knee subluxations)  LIVING ENVIRONMENT: Lives with: lives alone Lives in: House/apartment Has upstairs but could live downstairs if needed  OCCUPATION: Child psychotherapist, Engineer, structural (lots of driving, standing, giving talks)  PLOF: Independent, Independent with basic ADLs, Independent with household mobility without device, Independent with community mobility without device, Independent with gait, and Independent with transfers  PATIENT GOALS: decreased pain  NEXT MD VISIT: prn  OBJECTIVE:   DIAGNOSTIC FINDINGS: Multiple cervical spine and many other diagnostics on this patient.  See in chart review.    COGNITION: Overall cognitive status: Within functional limits for tasks assessed     SENSATION: WFL  MUSCLE LENGTH: Hamstrings: Right approx 45 deg; Left approx 50 deg Thomas test: Right neg; Left neg  POSTURE: No Significant postural limitations  LOWER EXTREMITY ROM:  WFL (left hip slightly limited)  LOWER EXTREMITY  MMT:  Generally 4+ to 5/5   FUNCTIONAL TESTS:  5 times sit to stand: 6.61 sec Timed up and go (TUG): 6.55 sec  GAIT: Distance walked: 30 Assistive device utilized: None Level of assistance: Complete  Independence Comments: normal   TODAY'S TREATMENT:   11/16/22:Pt arrives for aquatic physical therapy. Treatment took place in 3.5-5.5 feet of water. Water temperature was 91 degrees . Pt entered the pool via stairs reciprocally but slowly with use of bil rails. Pt requires buoyancy of water for support and to offload joints with strengthening exercises.  Seated water bench with 75% submersion Pt performed seated LE AROM exercises 10x2 in all planes with concurrent discussion of current status and how she did after her first last session. 75% depth water walking 8x in all directions with SI belt with 2-3 minute seated rest break in between to manage RT hip and SI pain. Standing in 75% depth with arm flex/ext with glute & TA contraction 10x2. Out of time for plank work today.  11/09/22:Pt arrives for aquatic physical therapy. Treatment took place in 3.5-5.5 feet of water. Water temperature was 91 degrees . Pt entered the pool via stairs reciprocally but slowly with use of bil rails. Pt requires buoyancy of water for support and to offload joints with strengthening exercises.  Seated water bench with 75% submersion Pt performed seated LE AROM exercises 10x2 in all planes with concurrent discussion of current status and how she did after her first last session. 75% depth water walking 6x in all directions with trial of SI belt with 2-3 minute seated rest break in between to manage RT hip and SI pain. Standing in 75% depth with arm flex/ext with glute & TA contraction 10x2. Small forward plank with hands on pool deck: hold for 5 sec, take one arm off then try to use back extensors to stand back upright. 6x.                                              PATIENT EDUCATION:  Education details: explained that pool may allow for more ease of movement but will not likely help with joint stability  Person educated: Patient Education method: Explanation Education comprehension: verbalized understanding  HOME  EXERCISE PROGRAM: Did not issue, patient speaks of multiple attempts at exercises for stability.  She feels that no one understands her condition well.  She described exercises that evaluating PT would have recommended but she felt these were contraindicated for her.  The only person she has had a little success with is her Gaffer.  Since patient will be doing pool only, we held on any land HEP exercises.   ASSESSMENT:  CLINICAL IMPRESSION: Pt has doubled her amount of water walking. Pt has not had any cervical or LT UE pain exercising in the water. Pt reports her RT hip/SI pain does increase post session but returns back to baseline within a few hours.  OBJECTIVE IMPAIRMENTS: decreased activity tolerance, impaired perceived functional ability, and pain.   ACTIVITY LIMITATIONS: carrying, lifting, bending, standing, squatting, sleeping, stairs, transfers, bed mobility, continence, bathing, toileting, dressing, self feeding, reach over head, and hygiene/grooming  PARTICIPATION LIMITATIONS: meal prep, cleaning, laundry, driving, shopping, community activity, occupation, yard work, and church  PERSONAL FACTORS: Behavior pattern, Past/current experiences, Profession, Time since onset of injury/illness/exacerbation, and 3+ comorbidities: see history POTS, Mast cell disorder, and  multiple others  are also affecting patient's functional outcome.   REHAB POTENTIAL: Poor due to chronic nature of her condition  CLINICAL DECISION MAKING: Unstable/unpredictable  EVALUATION COMPLEXITY: High   GOALS: Goals reviewed with patient? Yes  SHORT TERM GOALS: Target date: 11/11/2022  25% improvement in pain Baseline: Goal status: INITIAL  2.  Patient to report pool as an option for managing her E.D. Baseline:  Goal status: Goal met 11/02/22   LONG TERM GOALS: Target date: 12/09/2022   Patient to report 30% improvement in pain Baseline:  Goal status: INITIAL  2.  Patient to be able to  join pool program for continued pain control and management of symptoms Baseline:  Goal status: INITIAL  PLAN:  PT FREQUENCY: 1x/week  PT DURATION: 8 weeks  PLANNED INTERVENTIONS: Therapeutic exercises, Therapeutic activity, Neuromuscular re-education, Balance training, Gait training, Patient/Family education, Self Care, Joint mobilization, Stair training, DME instructions, Aquatic Therapy, Manual therapy, and Re-evaluation  PLAN FOR NEXT SESSION: Aquatics only 1 time per week x 8 weeks   Ane Payment, PTA 11/23/22 7:47 AM    [facility] [address], [address] [place], [place] [address] Phone # [phone] Fax [phone]

## 2022-11-25 ENCOUNTER — Ambulatory Visit: Payer: BC Managed Care – PPO | Attending: Sports Medicine | Admitting: Physical Therapy

## 2022-11-25 ENCOUNTER — Encounter: Payer: Self-pay | Admitting: Physical Therapy

## 2022-11-25 DIAGNOSIS — R262 Difficulty in walking, not elsewhere classified: Secondary | ICD-10-CM | POA: Insufficient documentation

## 2022-11-25 DIAGNOSIS — R293 Abnormal posture: Secondary | ICD-10-CM | POA: Diagnosis not present

## 2022-11-25 DIAGNOSIS — Q796 Ehlers-Danlos syndrome, unspecified: Secondary | ICD-10-CM | POA: Diagnosis not present

## 2022-11-25 DIAGNOSIS — M6281 Muscle weakness (generalized): Secondary | ICD-10-CM | POA: Diagnosis not present

## 2022-11-25 DIAGNOSIS — R252 Cramp and spasm: Secondary | ICD-10-CM | POA: Insufficient documentation

## 2022-11-25 NOTE — Therapy (Signed)
OUTPATIENT PHYSICAL THERAPY LOWER EXTREMITY    Patient Name: Marissa [name] MRN: [id number] DOB:[birth year], 57 y.o., female Today's Date: 11/25/2022  END OF SESSION:  PT End of Session - 11/25/22 1532     Visit Number 6    Date for PT Re-Evaluation 12/09/22    Authorization Type BCBS    PT Start Time 1430    PT Stop Time 1510    PT Time Calculation (min) 40 min    Activity Tolerance Patient limited by pain    Behavior During Therapy [facility] for tasks assessed/performed                Past Medical History:  Diagnosis Date   Ehlers-Danlos syndrome    History of pituitary tumor    Ocular migraine    Past Surgical History:  Procedure Laterality Date   KNEE SURGERY N/A 2012   SHOULDER SURGERY N/A 2009   UTERINE FIBROID SURGERY N/A    Patient Active Problem List   Diagnosis Date Noted   Subluxation 04/07/2022   Right hip subluxation, sequela 02/15/2022   Asthma 11/11/2021   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   Finger pain, right 07/15/2021   Knee pain, left 04/25/2019   Bicipital tendonitis of left shoulder 04/25/2019   Varicose veins of bilateral lower extremities with other complications 10/25/2018   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   POTS (postural orthostatic tachycardia syndrome) 08/29/2017   Chronic pain syndrome 08/29/2017   Chronic insomnia 08/29/2017   Wrist pain, acute 06/02/2016   Enthesopathy of right shoulder 01/28/2016   Right shoulder pain 01/28/2016   Family history of early CAD 11/30/2015   Cervical disc disorder with radiculopathy of cervical region 05/13/2014   Chronic right SI joint pain 08/28/2013   Hip pain, right 06/25/2013   Elbow pain, right 02/13/2013   Bunionette 01/07/2013   Ehlers-Danlos disease 11/08/2012   Unspecified visual disturbance 04/19/2012   Pituitary adenoma with extrasellar extension (HCC) 04/19/2012    PCP: Maurice Small, MD   REFERRING PROVIDER: Enid Baas, MD  REFERRING DIAG: [phone] (ICD-10-CM) -  Ehlers-Danlos disease M25.551 (ICD-10-CM) - Hip pain, right M79.18 (ICD-10-CM) - Myalgia, multiple sites  THERAPY DIAG:  Muscle weakness (generalized)  Difficulty in walking, not elsewhere classified  Cramp and spasm  Ehlers-Danlos syndrome  Rationale for Evaluation and Treatment: Rehabilitation  ONSET DATE: 2005 (1990 diagnosed with POTS)  SUBJECTIVE:   SUBJECTIVE STATEMENT: I had to walk >5, 000 steps yesterday at [facility] and my hip has been very painful since. Sleeping was very difficult.  PERTINENT HISTORY: POTS as well PAIN:  Are you having pain? Yes: NPRS scale: 6-9/10 Pain location: all over RT hipI Pain description: aching, numbness Aggravating factors: everything Relieving factors: once in a while,  pilates helps, she does this on a regular basis  PRECAUTIONS: Other: ED , POTS, "Mast cell activation disorder"  RED FLAGS: None   WEIGHT BEARING RESTRICTIONS: No  FALLS:  Has patient fallen in last 6 months? Yes. Number of falls 3 (1 on lyrica, and the other 2 from hip and knee subluxations)  LIVING ENVIRONMENT: Lives with: lives alone Lives in: House/apartment Has upstairs but could live downstairs if needed  OCCUPATION: Child psychotherapist, Engineer, structural (lots of driving, standing, giving talks)  PLOF: Independent, Independent with basic ADLs, Independent with household mobility without device, Independent with community mobility without device, Independent with gait, and Independent with transfers  PATIENT GOALS: decreased pain  NEXT MD VISIT: prn  OBJECTIVE:   DIAGNOSTIC FINDINGS:  Multiple cervical spine and many other diagnostics on this patient.  See in chart review.    COGNITION: Overall cognitive status: Within functional limits for tasks assessed     SENSATION: WFL  MUSCLE LENGTH: Hamstrings: Right approx 45 deg; Left approx 50 deg Thomas test: Right neg; Left neg  POSTURE: No Significant postural limitations  LOWER EXTREMITY  ROM:  WFL (left hip slightly limited)  LOWER EXTREMITY MMT:  Generally 4+ to 5/5   FUNCTIONAL TESTS:  5 times sit to stand: 6.61 sec Timed up and go (TUG): 6.55 sec  GAIT: Distance walked: 30 Assistive device utilized: None Level of assistance: Complete Independence Comments: normal   TODAY'S TREATMENT:   11/25/22:Pt arrives for aquatic physical therapy. Treatment took place in 3.5-5.5 feet of water. Water temperature was 90 degrees . Pt entered the pool via stairs reciprocally but slowly with use of bil rails. Pt requires buoyancy of water for support and to offload joints with strengthening exercises.  Seated water bench with 75% submersion Pt performed seated LE AROM exercises 10x2 in all planes with concurrent discussion of current status and how she did after her first last session.Held water walking today secondary to condition of Rt hip. Standing in 75% depth with arm flex/ext with glute & TA contraction 10x2. Plank with arms on side of pool, hold 8 sec for core, take one hand off, step forward slightly then stand up using her trunk muscles.4x  11/16/22:Pt arrives for aquatic physical therapy. Treatment took place in 3.5-5.5 feet of water. Water temperature was 91 degrees . Pt entered the pool via stairs reciprocally but slowly with use of bil rails. Pt requires buoyancy of water for support and to offload joints with strengthening exercises.  Seated water bench with 75% submersion Pt performed seated LE AROM exercises 10x2 in all planes with concurrent discussion of current status and how she did after her first last session. 75% depth water walking 8x in all directions with SI belt with 2-3 minute seated rest break in between to manage RT hip and SI pain. Standing in 75% depth with arm flex/ext with glute & TA contraction 10x2. Out of time for plank work today.  11/09/22:Pt arrives for aquatic physical therapy. Treatment took place in 3.5-5.5 feet of water. Water temperature was  91 degrees . Pt entered the pool via stairs reciprocally but slowly with use of bil rails. Pt requires buoyancy of water for support and to offload joints with strengthening exercises.  Seated water bench with 75% submersion Pt performed seated LE AROM exercises 10x2 in all planes with concurrent discussion of current status and how she did after her first last session. 75% depth water walking 6x in all directions with trial of SI belt with 2-3 minute seated rest break in between to manage RT hip and SI pain. Standing in 75% depth with arm flex/ext with glute & TA contraction 10x2. Small forward plank with hands on pool deck: hold for 5 sec, take one arm off then try to use back extensors to stand back upright. 6x.                                              PATIENT EDUCATION:  Education details: explained that pool may allow for more ease of movement but will not likely help with joint stability  Person educated: Patient Education method: Explanation Education  comprehension: verbalized understanding  HOME EXERCISE PROGRAM: Did not issue, patient speaks of multiple attempts at exercises for stability.  She feels that no one understands her condition well.  She described exercises that evaluating PT would have recommended but she felt these were contraindicated for her.  The only person she has had a little success with is her Gaffer.  Since patient will be doing pool only, we held on any land HEP exercises.   ASSESSMENT:  CLINICAL IMPRESSION: Pt presents with increased Rt hip pain yesterday after walking >5,000 steps for a speaking emgagement for work yesterday. Due to the nature of the pain we help on walking and worked on more static exercises for core strength and stabilization. We were able to keep hip pain from being increased any further. Pt also very tired from a poor nights sleep due to the pain.   OBJECTIVE IMPAIRMENTS: decreased activity tolerance, impaired perceived  functional ability, and pain.   ACTIVITY LIMITATIONS: carrying, lifting, bending, standing, squatting, sleeping, stairs, transfers, bed mobility, continence, bathing, toileting, dressing, self feeding, reach over head, and hygiene/grooming  PARTICIPATION LIMITATIONS: meal prep, cleaning, laundry, driving, shopping, community activity, occupation, yard work, and church  PERSONAL FACTORS: Behavior pattern, Past/current experiences, Profession, Time since onset of injury/illness/exacerbation, and 3+ comorbidities: see history POTS, Mast cell disorder, and multiple others  are also affecting patient's functional outcome.   REHAB POTENTIAL: Poor due to chronic nature of her condition  CLINICAL DECISION MAKING: Unstable/unpredictable  EVALUATION COMPLEXITY: High   GOALS: Goals reviewed with patient? Yes  SHORT TERM GOALS: Target date: 11/11/2022  25% improvement in pain Baseline: Goal status: INITIAL  2.  Patient to report pool as an option for managing her E.D. Baseline:  Goal status: Goal met 11/02/22   LONG TERM GOALS: Target date: 12/09/2022   Patient to report 30% improvement in pain Baseline:  Goal status: INITIAL  2.  Patient to be able to join pool program for continued pain control and management of symptoms Baseline:  Goal status: INITIAL  PLAN:  PT FREQUENCY: 1x/week  PT DURATION: 8 weeks  PLANNED INTERVENTIONS: Therapeutic exercises, Therapeutic activity, Neuromuscular re-education, Balance training, Gait training, Patient/Family education, Self Care, Joint mobilization, Stair training, DME instructions, Aquatic Therapy, Manual therapy, and Re-evaluation  PLAN FOR NEXT SESSION: Aquatics only 1 time per week x 8 weeks   Ane Payment, PTA 11/25/22 3:34 PM    [facility] [address], [address] [place], [place] [address] Phone # [phone] Fax [phone]

## 2022-11-28 DIAGNOSIS — D649 Anemia, unspecified: Secondary | ICD-10-CM | POA: Diagnosis not present

## 2022-11-28 DIAGNOSIS — R1013 Epigastric pain: Secondary | ICD-10-CM | POA: Diagnosis not present

## 2022-11-28 DIAGNOSIS — R824 Acetonuria: Secondary | ICD-10-CM | POA: Diagnosis not present

## 2022-11-28 DIAGNOSIS — R000 Tachycardia, unspecified: Secondary | ICD-10-CM | POA: Diagnosis not present

## 2022-11-30 ENCOUNTER — Encounter: Payer: Self-pay | Admitting: Physical Therapy

## 2022-11-30 ENCOUNTER — Ambulatory Visit: Payer: BC Managed Care – PPO | Admitting: Physical Therapy

## 2022-11-30 DIAGNOSIS — M6281 Muscle weakness (generalized): Secondary | ICD-10-CM

## 2022-11-30 DIAGNOSIS — R293 Abnormal posture: Secondary | ICD-10-CM | POA: Diagnosis not present

## 2022-11-30 DIAGNOSIS — R252 Cramp and spasm: Secondary | ICD-10-CM | POA: Diagnosis not present

## 2022-11-30 DIAGNOSIS — Q796 Ehlers-Danlos syndrome, unspecified: Secondary | ICD-10-CM | POA: Diagnosis not present

## 2022-11-30 DIAGNOSIS — R262 Difficulty in walking, not elsewhere classified: Secondary | ICD-10-CM

## 2022-11-30 NOTE — Therapy (Signed)
OUTPATIENT PHYSICAL THERAPY LOWER EXTREMITY    Patient Name: Marissa [name] MRN: [id number] DOB:[birth year], 57 y.o., female Today's Date: 11/30/2022  END OF SESSION:  PT End of Session - 11/30/22 2121     Visit Number 7    Date for PT Re-Evaluation 12/09/22    Authorization Type BCBS    PT Start Time 0845    PT Stop Time 0930    PT Time Calculation (min) 45 min    Behavior During Therapy [facility] for tasks assessed/performed                 Past Medical History:  Diagnosis Date   Ehlers-Danlos syndrome    History of pituitary tumor    Ocular migraine    Past Surgical History:  Procedure Laterality Date   KNEE SURGERY N/A 2012   SHOULDER SURGERY N/A 2009   UTERINE FIBROID SURGERY N/A    Patient Active Problem List   Diagnosis Date Noted   Subluxation 04/07/2022   Right hip subluxation, sequela 02/15/2022   Asthma 11/11/2021   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   Finger pain, right 07/15/2021   Knee pain, left 04/25/2019   Bicipital tendonitis of left shoulder 04/25/2019   Varicose veins of bilateral lower extremities with other complications 10/25/2018   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   POTS (postural orthostatic tachycardia syndrome) 08/29/2017   Chronic pain syndrome 08/29/2017   Chronic insomnia 08/29/2017   Wrist pain, acute 06/02/2016   Enthesopathy of right shoulder 01/28/2016   Right shoulder pain 01/28/2016   Family history of early CAD 11/30/2015   Cervical disc disorder with radiculopathy of cervical region 05/13/2014   Chronic right SI joint pain 08/28/2013   Hip pain, right 06/25/2013   Elbow pain, right 02/13/2013   Bunionette 01/07/2013   Ehlers-Danlos disease 11/08/2012   Unspecified visual disturbance 04/19/2012   Pituitary adenoma with extrasellar extension (HCC) 04/19/2012    PCP: Maurice Small, MD   REFERRING PROVIDER: Enid Baas, MD  REFERRING DIAG: [phone] (ICD-10-CM) - Ehlers-Danlos disease M25.551 (ICD-10-CM) - Hip  pain, right M79.18 (ICD-10-CM) - Myalgia, multiple sites  THERAPY DIAG:  Muscle weakness (generalized)  Difficulty in walking, not elsewhere classified  Cramp and spasm  Ehlers-Danlos syndrome  Rationale for Evaluation and Treatment: Rehabilitation  ONSET DATE: 2005 (1990 diagnosed with POTS)  SUBJECTIVE:   SUBJECTIVE STATEMENT: I felt really terrible Sat (still from her [employer] experience) by Sunday/Monday I was back to baseline. RT hip 6/10, RT SI 5/10 today.Marland Kitchen  PERTINENT HISTORY: POTS as well PAIN:  Are you having pain? Yes: NPRS scale: 6-9/10 Pain location: all over RT hipI& SI Pain description: aching, numbness Aggravating factors: everything Relieving factors: once in a while,  pilates helps, she does this on a regular basis  PRECAUTIONS: Other: ED , POTS, "Mast cell activation disorder"  RED FLAGS: None   WEIGHT BEARING RESTRICTIONS: No  FALLS:  Has patient fallen in last 6 months? Yes. Number of falls 3 (1 on lyrica, and the other 2 from hip and knee subluxations)  LIVING ENVIRONMENT: Lives with: lives alone Lives in: House/apartment Has upstairs but could live downstairs if needed  OCCUPATION: Child psychotherapist, Engineer, structural (lots of driving, standing, giving talks)  PLOF: Independent, Independent with basic ADLs, Independent with household mobility without device, Independent with community mobility without device, Independent with gait, and Independent with transfers  PATIENT GOALS: decreased pain  NEXT MD VISIT: prn  OBJECTIVE:   DIAGNOSTIC FINDINGS: Multiple cervical spine and many  other diagnostics on this patient.  See in chart review.    COGNITION: Overall cognitive status: Within functional limits for tasks assessed     SENSATION: WFL  MUSCLE LENGTH: Hamstrings: Right approx 45 deg; Left approx 50 deg Thomas test: Right neg; Left neg  POSTURE: No Significant postural limitations  LOWER EXTREMITY ROM:  WFL (left hip  slightly limited)  LOWER EXTREMITY MMT:  Generally 4+ to 5/5   FUNCTIONAL TESTS:  5 times sit to stand: 6.61 sec Timed up and go (TUG): 6.55 sec  GAIT: Distance walked: 30 Assistive device utilized: None Level of assistance: Complete Independence Comments: normal   TODAY'S TREATMENT:   11/30/22:Pt arrives for aquatic physical therapy. Treatment took place in 3.5-5.5 feet of water. Water temperature was 91 degrees . Pt entered the pool via stairs reciprocally but slowly with use of bil rails. Pt requires buoyancy of water for support and to offload joints with strengthening exercises.  Seated water bench with 75% submersion Pt performed seated LE AROM exercises 10x2 in all planes with concurrent discussion of current status and how she did after her first last session. Resumed water walking at 75% depth 8 lengths each with 1-2 min rest in between. Standing in 75% depth with arm flex/ext with glute & TA contraction 10x2. Step ups on first step holding onto the rail: Lt 10x, RT 2x5.  11/25/22:Pt arrives for aquatic physical therapy. Treatment took place in 3.5-5.5 feet of water. Water temperature was 90 degrees . Pt entered the pool via stairs reciprocally but slowly with use of bil rails. Pt requires buoyancy of water for support and to offload joints with strengthening exercises.  Seated water bench with 75% submersion Pt performed seated LE AROM exercises 10x2 in all planes with concurrent discussion of current status and how she did after her first last session.Held water walking today secondary to condition of Rt hip. Standing in 75% depth with arm flex/ext with glute & TA contraction 10x2. Plank with arms on side of pool, hold 8 sec for core, take one hand off, step forward slightly then stand up using her trunk muscles.4x  11/16/22:Pt arrives for aquatic physical therapy. Treatment took place in 3.5-5.5 feet of water. Water temperature was 91 degrees . Pt entered the pool via stairs  reciprocally but slowly with use of bil rails. Pt requires buoyancy of water for support and to offload joints with strengthening exercises.  Seated water bench with 75% submersion Pt performed seated LE AROM exercises 10x2 in all planes with concurrent discussion of current status and how she did after her first last session. 75% depth water walking 8x in all directions with SI belt with 2-3 minute seated rest break in between to manage RT hip and SI pain. Standing in 75% depth with arm flex/ext with glute & TA contraction 10x2. Out of time for plank work today.                                               PATIENT EDUCATION:  Education details: explained that pool may allow for more ease of movement but will not likely help with joint stability  Person educated: Patient Education method: Explanation Education comprehension: verbalized understanding  HOME EXERCISE PROGRAM: Did not issue, patient speaks of multiple attempts at exercises for stability.  She feels that no one understands her condition well.  She  described exercises that evaluating PT would have recommended but she felt these were contraindicated for her.  The only person she has had a little success with is her Gaffer.  Since patient will be doing pool only, we held on any land HEP exercises.   ASSESSMENT:  CLINICAL IMPRESSION: Pt able to resume her prior level of activity today. She is thinking of checking out the [facility]. She is pleased that her cervical pain does not increase with aquatic exercise like it had with previous land based exercises.  OBJECTIVE IMPAIRMENTS: decreased activity tolerance, impaired perceived functional ability, and pain.   ACTIVITY LIMITATIONS: carrying, lifting, bending, standing, squatting, sleeping, stairs, transfers, bed mobility, continence, bathing, toileting, dressing, self feeding, reach over head, and hygiene/grooming  PARTICIPATION LIMITATIONS: meal prep, cleaning,  laundry, driving, shopping, community activity, occupation, yard work, and church  PERSONAL FACTORS: Behavior pattern, Past/current experiences, Profession, Time since onset of injury/illness/exacerbation, and 3+ comorbidities: see history POTS, Mast cell disorder, and multiple others  are also affecting patient's functional outcome.   REHAB POTENTIAL: Poor due to chronic nature of her condition  CLINICAL DECISION MAKING: Unstable/unpredictable  EVALUATION COMPLEXITY: High   GOALS: Goals reviewed with patient? Yes  SHORT TERM GOALS: Target date: 11/11/2022  25% improvement in pain Baseline: Goal status: INITIAL  2.  Patient to report pool as an option for managing her E.D. Baseline:  Goal status: Goal met 11/02/22   LONG TERM GOALS: Target date: 12/09/2022   Patient to report 30% improvement in pain Baseline:  Goal status: INITIAL  2.  Patient to be able to join pool program for continued pain control and management of symptoms Baseline:  Goal status: INITIAL  PLAN:  PT FREQUENCY: 1x/week  PT DURATION: 8 weeks  PLANNED INTERVENTIONS: Therapeutic exercises, Therapeutic activity, Neuromuscular re-education, Balance training, Gait training, Patient/Family education, Self Care, Joint mobilization, Stair training, DME instructions, Aquatic Therapy, Manual therapy, and Re-evaluation  PLAN FOR NEXT SESSION: Pt trying to schedule a reassessment visit as her POC will expire before her scheduled appts due to getting started earlier. Pt started earlier due to being the wait list.   Ane Payment, PTA 11/30/22 9:24 PM    [facility] [address] [place], [place] [address] Phone # [phone] Fax [phone]

## 2022-12-01 NOTE — Unmapped (Signed)
[facility] Refill Coordination Note    Natasha Copeland, DOB: 11-28-1965  Phone: [phone] (home)       All above HIPAA information was verified with patient.         12/01/2022    12:54 PM   Specialty Rx Medication Refill Questionnaire   Which Medications would you like refilled and shipped? Xolair   Please list all current allergies: Doxipen   Have you missed any doses in the last 30 days? No   Have you had any changes to your medication(s) since your last refill? No   How many days remaining of each medication do you have at home? 0   If receiving an injectable medication, next injection date is 12/19/2022   Have you experienced any side effects in the last 30 days? No   Please enter the full address (street address, city, state, zip code) where you would like your medication(s) to be delivered to. [address], [place], [place] [address]   Please specify on which day you would like your medication(s) to arrive. Note: if you need your medication(s) within 3 days, please call the pharmacy to schedule your order at [phone]  12/07/2022   Has your insurance changed since your last refill? No   Would you like a pharmacist to call you to discuss your medication(s)? No   Do you require a signature for your package? (Note: if we are billing Medicare Part B or your order contains a controlled substance, we will require a signature) No         Completed refill call assessment today to schedule patient's medication shipment from the [facility] [phone]).  All relevant notes have been reviewed.       Confirmed patient received a Conservation officer, historic buildings and a Surveyor, mining with first shipment. The patient will receive a drug information handout for each medication shipped and additional FDA Medication Guides as required.         REFERRAL TO PHARMACIST     Referral to the pharmacist: Not needed      [facility]     Shipping address confirmed in Epic. Delivery Scheduled: Yes, Expected medication delivery date: 12/07/22.     Medication will be delivered via UPS to the prescription address in Epic WAM.    Oliva Bustard, PharmD   [facility] Specialty Pharmacist

## 2022-12-02 DIAGNOSIS — F4323 Adjustment disorder with mixed anxiety and depressed mood: Secondary | ICD-10-CM | POA: Diagnosis not present

## 2022-12-06 MED ORDER — XOLAIR 300 MG/2 ML SUBCUTANEOUS SYRINGE
SUBCUTANEOUS | 6 refills | 28 days
Start: 2022-12-06 — End: ?

## 2022-12-06 MED FILL — XOLAIR 300 MG/2 ML SUBCUTANEOUS AUTO-INJECTOR: SUBCUTANEOUS | 28 days supply | Qty: 2 | Fill #4

## 2022-12-07 ENCOUNTER — Encounter: Payer: Self-pay | Admitting: Physical Therapy

## 2022-12-07 ENCOUNTER — Ambulatory Visit: Payer: BC Managed Care – PPO | Admitting: Physical Therapy

## 2022-12-07 DIAGNOSIS — R262 Difficulty in walking, not elsewhere classified: Secondary | ICD-10-CM

## 2022-12-07 DIAGNOSIS — M6281 Muscle weakness (generalized): Secondary | ICD-10-CM | POA: Diagnosis not present

## 2022-12-07 DIAGNOSIS — Q796 Ehlers-Danlos syndrome, unspecified: Secondary | ICD-10-CM | POA: Diagnosis not present

## 2022-12-07 DIAGNOSIS — R293 Abnormal posture: Secondary | ICD-10-CM | POA: Diagnosis not present

## 2022-12-07 DIAGNOSIS — R252 Cramp and spasm: Secondary | ICD-10-CM

## 2022-12-07 NOTE — Therapy (Signed)
OUTPATIENT PHYSICAL THERAPY LOWER EXTREMITY    Patient Name: Marissa [name] MRN: [id number] DOB:03-24-1965, 57 y.o., female Today's Date: 12/07/2022  END OF SESSION:  PT End of Session - 12/07/22 1939     Visit Number 8    Date for PT Re-Evaluation 12/09/22    Authorization Type BCBS    PT Start Time 0845    PT Stop Time 0925    PT Time Calculation (min) 40 min    Activity Tolerance Patient limited by pain    Behavior During Therapy [facility] for tasks assessed/performed                  Past Medical History:  Diagnosis Date   Ehlers-Danlos syndrome    History of pituitary tumor    Ocular migraine    Past Surgical History:  Procedure Laterality Date   KNEE SURGERY N/A 2012   SHOULDER SURGERY N/A 2009   UTERINE FIBROID SURGERY N/A    Patient Active Problem List   Diagnosis Date Noted   Subluxation 04/07/2022   Right hip subluxation, sequela 02/15/2022   Asthma 11/11/2021   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   Finger pain, right 07/15/2021   Knee pain, left 04/25/2019   Bicipital tendonitis of left shoulder 04/25/2019   Varicose veins of bilateral lower extremities with other complications 10/25/2018   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   POTS (postural orthostatic tachycardia syndrome) 08/29/2017   Chronic pain syndrome 08/29/2017   Chronic insomnia 08/29/2017   Wrist pain, acute 06/02/2016   Enthesopathy of right shoulder 01/28/2016   Right shoulder pain 01/28/2016   Family history of early CAD 11/30/2015   Cervical disc disorder with radiculopathy of cervical region 05/13/2014   Chronic right SI joint pain 08/28/2013   Hip pain, right 06/25/2013   Elbow pain, right 02/13/2013   Bunionette 01/07/2013   Ehlers-Danlos disease 11/08/2012   Unspecified visual disturbance 04/19/2012   Pituitary adenoma with extrasellar extension (HCC) 04/19/2012    PCP: Maurice Small, MD   REFERRING PROVIDER: Enid Baas, MD  REFERRING DIAG: [phone] (ICD-10-CM) -  Ehlers-Danlos disease M25.551 (ICD-10-CM) - Hip pain, right M79.18 (ICD-10-CM) - Myalgia, multiple sites  THERAPY DIAG:  Muscle weakness (generalized)  Difficulty in walking, not elsewhere classified  Cramp and spasm  Ehlers-Danlos syndrome  Rationale for Evaluation and Treatment: Rehabilitation  ONSET DATE: 2005 (1990 diagnosed with POTS)  SUBJECTIVE:   SUBJECTIVE STATEMENT: My SI has been giving me a fit lately. Recent blood work shows low potassium and iron.  PERTINENT HISTORY: POTS as well PAIN:  Are you having pain? Yes: NPRS scale: 6-9/10 Pain location: all over RT hipI& SI Pain description: aching, numbness Aggravating factors: everything Relieving factors: once in a while,  pilates helps, she does this on a regular basis  PRECAUTIONS: Other: ED , POTS, "Mast cell activation disorder"  RED FLAGS: None   WEIGHT BEARING RESTRICTIONS: No  FALLS:  Has patient fallen in last 6 months? Yes. Number of falls 3 (1 on lyrica, and the other 2 from hip and knee subluxations)  LIVING ENVIRONMENT: Lives with: lives alone Lives in: House/apartment Has upstairs but could live downstairs if needed  OCCUPATION: Child psychotherapist, Engineer, structural (lots of driving, standing, giving talks)  PLOF: Independent, Independent with basic ADLs, Independent with household mobility without device, Independent with community mobility without device, Independent with gait, and Independent with transfers  PATIENT GOALS: decreased pain  NEXT MD VISIT: prn  OBJECTIVE:   DIAGNOSTIC FINDINGS: Multiple cervical  spine and many other diagnostics on this patient.  See in chart review.    COGNITION: Overall cognitive status: Within functional limits for tasks assessed     SENSATION: WFL  MUSCLE LENGTH: Hamstrings: Right approx 45 deg; Left approx 50 deg Thomas test: Right neg; Left neg  POSTURE: No Significant postural limitations  LOWER EXTREMITY ROM:  WFL (left hip  slightly limited)  LOWER EXTREMITY MMT:  Generally 4+ to 5/5   FUNCTIONAL TESTS:  5 times sit to stand: 6.61 sec Timed up and go (TUG): 6.55 sec  GAIT: Distance walked: 30 Assistive device utilized: None Level of assistance: Complete Independence Comments: normal   TODAY'S TREATMENT:   12/07/22:Pt arrives for aquatic physical therapy. Treatment took place in 3.5-5.5 feet of water. Water temperature was 90 degrees . Pt entered the pool via stairs reciprocally but slowly with use of bil rails. Pt requires buoyancy of water for support and to offload joints with strengthening exercises.  Seated water bench with 75% submersion Pt performed seated LE AROM exercises 10x2 in all planes with concurrent discussion of current status and how she did after last session. Water walking at 75% depth 8 lengths each with 1-2 min rest in between. Standing in 75% depth with arm flex/ext with glute & TA contraction 10x2. Step ups on first step holding onto the rail: Lt 10x, RT 2x5. Front plank at wall: Vc to contract glutes as pt releases 1 arm and attempts to stand erect. 5x with TC at chest wall to not over arch at thoracic.  11/30/22:Pt arrives for aquatic physical therapy. Treatment took place in 3.5-5.5 feet of water. Water temperature was 91 degrees . Pt entered the pool via stairs reciprocally but slowly with use of bil rails. Pt requires buoyancy of water for support and to offload joints with strengthening exercises.  Seated water bench with 75% submersion Pt performed seated LE AROM exercises 10x2 in all planes with concurrent discussion of current status and how she did after her first last session. Resumed water walking at 75% depth 8 lengths each with 1-2 min rest in between. Standing in 75% depth with arm flex/ext with glute & TA contraction 10x2. Step ups on first step holding onto the rail: Lt 10x, RT 2x5.  11/25/22:Pt arrives for aquatic physical therapy. Treatment took place in 3.5-5.5 feet of  water. Water temperature was 90 degrees . Pt entered the pool via stairs reciprocally but slowly with use of bil rails. Pt requires buoyancy of water for support and to offload joints with strengthening exercises.  Seated water bench with 75% submersion Pt performed seated LE AROM exercises 10x2 in all planes with concurrent discussion of current status and how she did after her first last session.Held water walking today secondary to condition of Rt hip. Standing in 75% depth with arm flex/ext with glute & TA contraction 10x2. Plank with arms on side of pool, hold 8 sec for core, take one hand off, step forward slightly then stand up using her trunk muscles.4x                                                PATIENT EDUCATION:  Education details: explained that pool may allow for more ease of movement but will not likely help with joint stability  Person educated: Patient Education method: Explanation Education comprehension: verbalized understanding  HOME  EXERCISE PROGRAM: Did not issue, patient speaks of multiple attempts at exercises for stability.  She feels that no one understands her condition well.  She described exercises that evaluating PT would have recommended but she felt these were contraindicated for her.  The only person she has had a little success with is her Gaffer.  Since patient will be doing pool only, we held on any land HEP exercises.   ASSESSMENT:  CLINICAL IMPRESSION: Mostly SI pain throughout todays treatment that has plaugued pt for last day or so. Pt did not have to reduce or modify any of her normal exercises today because of this pain. Pt did choose to stand for her rest breaks vs sit which helped manage the SI pain.   OBJECTIVE IMPAIRMENTS: decreased activity tolerance, impaired perceived functional ability, and pain.   ACTIVITY LIMITATIONS: carrying, lifting, bending, standing, squatting, sleeping, stairs, transfers, bed mobility, continence, bathing,  toileting, dressing, self feeding, reach over head, and hygiene/grooming  PARTICIPATION LIMITATIONS: meal prep, cleaning, laundry, driving, shopping, community activity, occupation, yard work, and church  PERSONAL FACTORS: Behavior pattern, Past/current experiences, Profession, Time since onset of injury/illness/exacerbation, and 3+ comorbidities: see history POTS, Mast cell disorder, and multiple others  are also affecting patient's functional outcome.   REHAB POTENTIAL: Poor due to chronic nature of her condition  CLINICAL DECISION MAKING: Unstable/unpredictable  EVALUATION COMPLEXITY: High   GOALS: Goals reviewed with patient? Yes  SHORT TERM GOALS: Target date: 11/11/2022  25% improvement in pain Baseline: Goal status: INITIAL  2.  Patient to report pool as an option for managing her E.D. Baseline:  Goal status: Goal met 11/02/22  3. Patient will report no cervical or LTUE exacerbations with aquatic exercises.  Baseline:  Goal Status: Met 12/07/22   LONG TERM GOALS: Target date: 12/09/2022   Patient to report 30% improvement in pain Baseline:  Goal status: INITIAL  2.  Patient to be able to join pool program for continued pain control and management of symptoms Baseline:  Goal status: INITIAL  PLAN:  PT FREQUENCY: 1x/week  PT DURATION: 8 weeks  PLANNED INTERVENTIONS: Therapeutic exercises, Therapeutic activity, Neuromuscular re-education, Balance training, Gait training, Patient/Family education, Self Care, Joint mobilization, Stair training, DME instructions, Aquatic Therapy, Manual therapy, and Re-evaluation  PLAN FOR NEXT SESSION: ERO next week as pt was able to get into pool off the wait list earlier. She would like to at a minimum finish her last two scheduled visits and explore an additional 4 visits to see if she can progress further with exercises.  Ane Payment, PTA 12/07/22 7:40 PM    [facility] [address]  [address] [place], [place] [address] Phone # [phone] Fax [phone]

## 2022-12-14 ENCOUNTER — Ambulatory Visit: Payer: BC Managed Care – PPO

## 2022-12-14 ENCOUNTER — Ambulatory Visit: Payer: BC Managed Care – PPO | Admitting: Physical Therapy

## 2022-12-14 DIAGNOSIS — R293 Abnormal posture: Secondary | ICD-10-CM

## 2022-12-14 DIAGNOSIS — Q796 Ehlers-Danlos syndrome, unspecified: Secondary | ICD-10-CM

## 2022-12-14 DIAGNOSIS — R262 Difficulty in walking, not elsewhere classified: Secondary | ICD-10-CM

## 2022-12-14 DIAGNOSIS — M6281 Muscle weakness (generalized): Secondary | ICD-10-CM

## 2022-12-14 DIAGNOSIS — R252 Cramp and spasm: Secondary | ICD-10-CM | POA: Diagnosis not present

## 2022-12-14 DIAGNOSIS — F4323 Adjustment disorder with mixed anxiety and depressed mood: Secondary | ICD-10-CM | POA: Diagnosis not present

## 2022-12-14 NOTE — Therapy (Signed)
OUTPATIENT PHYSICAL THERAPY LOWER EXTREMITY    Patient Name: Marissa [name] MRN: [id number] DOB:04-Sep-1965, 57 y.o., female Today's Date: 12/14/2022  END OF SESSION:  PT End of Session - 12/14/22 0830     Visit Number 9    Date for PT Re-Evaluation 02/08/23    Authorization Type BCBS    PT Start Time 0831    PT Stop Time 0855    PT Time Calculation (min) 24 min    Activity Tolerance Patient limited by pain    Behavior During Therapy [facility] for tasks assessed/performed                  Past Medical History:  Diagnosis Date   Ehlers-Danlos syndrome    History of pituitary tumor    Ocular migraine    Past Surgical History:  Procedure Laterality Date   KNEE SURGERY N/A 2012   SHOULDER SURGERY N/A 2009   UTERINE FIBROID SURGERY N/A    Patient Active Problem List   Diagnosis Date Noted   Subluxation 04/07/2022   Right hip subluxation, sequela 02/15/2022   Asthma 11/11/2021   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   Finger pain, right 07/15/2021   Knee pain, left 04/25/2019   Bicipital tendonitis of left shoulder 04/25/2019   Varicose veins of bilateral lower extremities with other complications 10/25/2018   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   POTS (postural orthostatic tachycardia syndrome) 08/29/2017   Chronic pain syndrome 08/29/2017   Chronic insomnia 08/29/2017   Wrist pain, acute 06/02/2016   Enthesopathy of right shoulder 01/28/2016   Right shoulder pain 01/28/2016   Family history of early CAD 11/30/2015   Cervical disc disorder with radiculopathy of cervical region 05/13/2014   Chronic right SI joint pain 08/28/2013   Hip pain, right 06/25/2013   Elbow pain, right 02/13/2013   Bunionette 01/07/2013   Ehlers-Danlos disease 11/08/2012   Unspecified visual disturbance 04/19/2012   Pituitary adenoma with extrasellar extension (HCC) 04/19/2012    PCP: Maurice Small, MD   REFERRING PROVIDER: Enid Baas, MD  REFERRING DIAG: [phone] (ICD-10-CM) -  Ehlers-Danlos disease M25.551 (ICD-10-CM) - Hip pain, right M79.18 (ICD-10-CM) - Myalgia, multiple sites  THERAPY DIAG:  Muscle weakness (generalized)  Difficulty in walking, not elsewhere classified  Cramp and spasm  Ehlers-Danlos syndrome  Abnormal posture  Rationale for Evaluation and Treatment: Rehabilitation  ONSET DATE: 2005 (1990 diagnosed with POTS)  SUBJECTIVE:   SUBJECTIVE STATEMENT: Patient reports she feels the therapy pool is helpful.  She isn't sure the skilled intervention is still helping as she is so limited in what she can do.  She is looking into getting a membership at either the [facility] or the aquatics center.    PERTINENT HISTORY: POTS as well PAIN:  12/14/22: Pain doesn't really change,  I will say that the left upper trap and left side of my neck does seem to be better.   Are you having pain? Yes: NPRS scale: 6-9/10 Pain location: all over RT hipI& SI Pain description: aching, numbness Aggravating factors: everything Relieving factors: once in a while,  pilates helps, she does this on a regular basis  PRECAUTIONS: Other: ED , POTS, "Mast cell activation disorder"  RED FLAGS: None   WEIGHT BEARING RESTRICTIONS: No  FALLS:  Has patient fallen in last 6 months? Yes. Number of falls 3 (1 on lyrica, and the other 2 from hip and knee subluxations)  LIVING ENVIRONMENT: Lives with: lives alone Lives in: House/apartment Has upstairs but could live  downstairs if needed  OCCUPATION: Child psychotherapist, Engineer, structural (lots of driving, standing, giving talks)  PLOF: Independent, Independent with basic ADLs, Independent with household mobility without device, Independent with community mobility without device, Independent with gait, and Independent with transfers  PATIENT GOALS: decreased pain  NEXT MD VISIT: prn  OBJECTIVE:   DIAGNOSTIC FINDINGS: Multiple cervical spine and many other diagnostics on this patient.  See in chart  review.    COGNITION: Overall cognitive status: Within functional limits for tasks assessed     SENSATION: WFL  MUSCLE LENGTH: Hamstrings: Right approx 45 deg; Left approx 50 deg Thomas test: Right neg; Left neg  POSTURE: No Significant postural limitations  LOWER EXTREMITY ROM:  WFL (left hip slightly limited)  LOWER EXTREMITY MMT:  Generally 4+ to 5/5   FUNCTIONAL TESTS:  Initial eval: 5 times sit to stand: 6.61 sec Timed up and go (TUG): 6.55 sec  12/14/22: 5 times sit to stand: 6.58 sec Timed up and go (TUG): 5.34 sec  GAIT: Distance walked: 30 Assistive device utilized: None Level of assistance: Complete Independence Comments: normal   TODAY'S TREATMENT:  12/14/22: Recert visit: reassessment completed Discussed options and progress  12/07/22:Pt arrives for aquatic physical therapy. Treatment took place in 3.5-5.5 feet of water. Water temperature was 90 degrees . Pt entered the pool via stairs reciprocally but slowly with use of bil rails. Pt requires buoyancy of water for support and to offload joints with strengthening exercises.  Seated water bench with 75% submersion Pt performed seated LE AROM exercises 10x2 in all planes with concurrent discussion of current status and how she did after last session. Water walking at 75% depth 8 lengths each with 1-2 min rest in between. Standing in 75% depth with arm flex/ext with glute & TA contraction 10x2. Step ups on first step holding onto the rail: Lt 10x, RT 2x5. Front plank at wall: Vc to contract glutes as pt releases 1 arm and attempts to stand erect. 5x with TC at chest wall to not over arch at thoracic.  11/30/22:Pt arrives for aquatic physical therapy. Treatment took place in 3.5-5.5 feet of water. Water temperature was 91 degrees . Pt entered the pool via stairs reciprocally but slowly with use of bil rails. Pt requires buoyancy of water for support and to offload joints with strengthening exercises.  Seated  water bench with 75% submersion Pt performed seated LE AROM exercises 10x2 in all planes with concurrent discussion of current status and how she did after her first last session. Resumed water walking at 75% depth 8 lengths each with 1-2 min rest in between. Standing in 75% depth with arm flex/ext with glute & TA contraction 10x2. Step ups on first step holding onto the rail: Lt 10x, RT 2x5.  11/25/22:Pt arrives for aquatic physical therapy. Treatment took place in 3.5-5.5 feet of water. Water temperature was 90 degrees . Pt entered the pool via stairs reciprocally but slowly with use of bil rails. Pt requires buoyancy of water for support and to offload joints with strengthening exercises.  Seated water bench with 75% submersion Pt performed seated LE AROM exercises 10x2 in all planes with concurrent discussion of current status and how she did after her first last session.Held water walking today secondary to condition of Rt hip. Standing in 75% depth with arm flex/ext with glute & TA contraction 10x2. Plank with arms on side of pool, hold 8 sec for core, take one hand off, step forward slightly then stand  up using her trunk muscles.4x                                                PATIENT EDUCATION:  Education details: explained that pool may allow for more ease of movement but will not likely help with joint stability  Person educated: Patient Education method: Explanation Education comprehension: verbalized understanding  HOME EXERCISE PROGRAM: Did not issue, patient speaks of multiple attempts at exercises for stability.  She feels that no one understands her condition well.  She described exercises that evaluating PT would have recommended but she felt these were contraindicated for her.  The only person she has had a little success with is her Gaffer.  Since patient will be doing pool only, we held on any land HEP exercises.   ASSESSMENT:  CLINICAL IMPRESSION: Patient is of  the mindset that there really is nothing that will help her condition but that she feels she can manage to continue to be functional by doing the pool walking.  She does not believe that strengthening the muscles around her joints give her more stability to the joint with Ehlers-Danlos.  She would benefit from finishing her last 2 sessions of pool to see if she can tolerate advancing past basic walking,  if no progress, she should continue on her own with her ideas of what is helpful to her.     OBJECTIVE IMPAIRMENTS: decreased activity tolerance, impaired perceived functional ability, and pain.   ACTIVITY LIMITATIONS: carrying, lifting, bending, standing, squatting, sleeping, stairs, transfers, bed mobility, continence, bathing, toileting, dressing, self feeding, reach over head, and hygiene/grooming  PARTICIPATION LIMITATIONS: meal prep, cleaning, laundry, driving, shopping, community activity, occupation, yard work, and church  PERSONAL FACTORS: Behavior pattern, Past/current experiences, Profession, Time since onset of injury/illness/exacerbation, and 3+ comorbidities: see history POTS, Mast cell disorder, and multiple others  are also affecting patient's functional outcome.   REHAB POTENTIAL: Poor due to chronic nature of her condition  CLINICAL DECISION MAKING: Unstable/unpredictable  EVALUATION COMPLEXITY: High   GOALS: Goals reviewed with patient? Yes  SHORT TERM GOALS: Target date: 11/11/2022  25% improvement in pain Baseline: Goal status: INITIAL  2.  Patient to report pool as an option for managing her E.D. Baseline:  Goal status: Goal met 11/02/22  3. Patient will report no cervical or LTUE exacerbations with aquatic exercises.  Baseline:  Goal Status: Met 12/07/22   LONG TERM GOALS: Target date: 12/09/2022   Patient to report 30% improvement in pain Baseline:  Goal status: INITIAL  2.  Patient to be able to join pool program for continued pain control and  management of symptoms Baseline:  Goal status: INITIAL  PLAN:  PT FREQUENCY: 1x/week  PT DURATION: 8 weeks  PLANNED INTERVENTIONS: Therapeutic exercises, Therapeutic activity, Neuromuscular re-education, Balance training, Gait training, Patient/Family education, Self Care, Joint mobilization, Stair training, DME instructions, Aquatic Therapy, Manual therapy, and Re-evaluation  PLAN FOR NEXT SESSION: Recertifying from 12/09/22 to accommodate her last 2 visits and to allow for 3-4 more if she feels she needs these.  She seems to think she will be ready to continue on her own after the next few sessions.    Victorino Dike B. Fawnda Vitullo, PT 12/14/22 9:06 AM [facility] [address] [place], [place] [address] Phone # [phone] Fax [phone]

## 2022-12-15 ENCOUNTER — Other Ambulatory Visit: Payer: Self-pay | Admitting: Sports Medicine

## 2022-12-21 ENCOUNTER — Encounter: Payer: Self-pay | Admitting: Cardiology

## 2022-12-21 ENCOUNTER — Ambulatory Visit: Payer: BC Managed Care – PPO | Attending: Cardiology | Admitting: Cardiology

## 2022-12-21 VITALS — BP 110/65 | HR 85 | Ht <= 58 in | Wt 97.4 lb

## 2022-12-21 DIAGNOSIS — G90A Postural orthostatic tachycardia syndrome (POTS): Secondary | ICD-10-CM

## 2022-12-21 DIAGNOSIS — Q7962 Hypermobile Ehlers-Danlos syndrome: Secondary | ICD-10-CM

## 2022-12-21 DIAGNOSIS — R002 Palpitations: Secondary | ICD-10-CM | POA: Diagnosis not present

## 2022-12-21 DIAGNOSIS — E878 Other disorders of electrolyte and fluid balance, not elsewhere classified: Secondary | ICD-10-CM

## 2022-12-21 NOTE — Patient Instructions (Addendum)
Medication Instructions:  Your physician recommends that you continue on your current medications as directed. Please refer to the Current Medication list given to you today.  *If you need a refill on your cardiac medications before your next appointment, please call your pharmacy*   Lab Work: CMET, Mag If you have labs (blood work) drawn today and your tests are completely normal, you will receive your results only by: MyChart Message (if you have MyChart) OR A paper copy in the mail If you have any lab test that is abnormal or we need to change your treatment, we will call you to review the results.   Testing/Procedures: Your physician has requested that you have an echocardiogram. Echocardiography is a painless test that uses sound waves to create images of your heart. It provides your doctor with information about the size and shape of your heart and how well your heart's chambers and valves are working. This procedure takes approximately one hour. There are no restrictions for this procedure. Please do NOT wear cologne, perfume, aftershave, or lotions (deodorant is allowed). Please arrive 15 minutes prior to your appointment time.  Please note: We ask at that you not bring children with you during ultrasound (echo/ vascular) testing. Due to room size and safety concerns, children are not allowed in the ultrasound rooms during exams. Our front office staff cannot provide observation of children in our lobby area while testing is being conducted. An adult accompanying a patient to their appointment will only be allowed in the ultrasound room at the discretion of the ultrasound technician under special circumstances. We apologize for any inconvenience.    Follow-Up: At [facility], you and your health needs are our priority.  As part of our continuing mission to provide you with exceptional heart care, we have created designated Provider Care Teams.  These Care Teams include your  primary Cardiologist (physician) and Advanced Practice Providers (APPs -  Physician Assistants and Nurse Practitioners) who all work together to provide you with the care you need, when you need it.  Your next appointment:   1 year(s)  Provider:   Thomasene Ripple, DO

## 2022-12-21 NOTE — Progress Notes (Addendum)
Cardiology Office Note:    Date:  12/21/2022   ID:  Marissa [name], DOB 08-29-1965, MRN [id number]  PCP:  Ollen Bowl, MD  Cardiologist:  Thomasene Ripple, DO  Electrophysiologist:  Regan Lemming, MD   Referring MD: No ref. provider found     History of Present Illness:    Marissa [name] is a 57 y.o. female with a hx of Ehlers-Danlos syndrome, POTS, mast cell activation syndrome here today for follow-up visit.    The patient previously saw Dr. Shari Prows.  This is my first visit with her.  Her last visit with Dr. Shari Prows was December 03, 2021.  During our visit they reviewed her echocardiogram From 2022.  And a repeat echocardiogram was done for her.  She also has seen Dr. Elberta Fortis in the past at that time she was on a metoprolol at that time and will stop and she was placed on Cardizem.  Her blood pressure does not tolerate these medications.  She then in September 2021 saw Dr. Graciela Husbands.  I visited discussed her dysautonomia today talked about the role of salt and water repletion.  Today she tells me that she does experience some episodes where her heart rates fluctuate and she becomes symptomatic.  She has not passed out.   Past Medical History:  Diagnosis Date   Ehlers-Danlos syndrome    History of pituitary tumor    Ocular migraine     Past Surgical History:  Procedure Laterality Date   KNEE SURGERY N/A 2012   SHOULDER SURGERY N/A 2009   UTERINE FIBROID SURGERY N/A     Current Medications: Current Meds  Medication Sig   AMBULATORY NON FORMULARY MEDICATION Naltrexone 1mg /cc  4cc three times daily   diazepam (VALIUM) 5 MG tablet 1 po tid prn muscle spasm   Fexofenadine HCl (ALLEGRA ALLERGY PO) Allegra Allergy   hydrOXYzine (ATARAX) 10 MG tablet Take 1 tablet (10 mg total) by mouth 4 (four) times daily as needed.   levocetirizine (XYZAL) 5 MG tablet Take 5 mg by mouth every evening.   Multiple Vitamin (MULTIVITAMIN) capsule Take 1 capsule by mouth daily.    norgestrel-ethinyl estradiol (CRYSELLE-28) 0.3-30 MG-MCG tablet Cryselle (28) 0.3 mg-30 mcg tablet  TAKE 1 TABLET BY MOUTH EVERY DAY CONTINUOUSLY   SYMBICORT 80-4.5 MCG/ACT inhaler Inhale into the lungs.   VENTOLIN HFA 108 (90 Base) MCG/ACT inhaler Inhale 2 puffs into the lungs every 4 (four) hours as needed.   XOLAIR 150 MG/ML prefilled syringe    zolpidem (AMBIEN) 10 MG tablet TAKE 1/2-1 TABLETS BY MOUTH AT BEDTIME AS NEEDED     Allergies:   Macrobid [nitrofurantoin monohyd macro] and Doxepin   Social History   Socioeconomic History   Marital status: Single    Spouse name: Not on file   Number of children: 0   Years of education: Masters   Highest education level: Not on file  Occupational History    Employer: [employer]  Tobacco Use   Smoking status: Never   Smokeless tobacco: Never  Vaping Use   Vaping status: Never Used  Substance and Sexual Activity   Alcohol use: No   Drug use: No   Sexual activity: Yes    Birth control/protection: Pill  Other Topics Concern   Not on file  Social History Narrative   Not on file   Social Determinants of Health   Financial Resource Strain: Not on file  Food Insecurity: Not on file  Transportation Needs: Not on  file  Physical Activity: Not on file  Stress: Not on file  Social Connections: Not on file     Family History: The patient's family history includes Arthritis in her father and mother; Atrial fibrillation in her mother; Diabetes in her father; Heart disease in her father; Heart failure in her father; Hypertension in her father. There is no history of Breast cancer.  ROS:   Review of Systems  Constitution: Negative for decreased appetite, fever and weight gain.  HENT: Negative for congestion, ear discharge, hoarse voice and sore throat.   Eyes: Negative for discharge, redness, vision loss in right eye and visual halos.  Cardiovascular: Negative for chest pain, dyspnea on exertion, leg swelling, orthopnea and  palpitations.  Respiratory: Negative for cough, hemoptysis, shortness of breath and snoring.   Endocrine: Negative for heat intolerance and polyphagia.  Hematologic/Lymphatic: Negative for bleeding problem. Does not bruise/bleed easily.  Skin: Negative for flushing, nail changes, rash and suspicious lesions.  Musculoskeletal: Negative for arthritis, joint pain, muscle cramps, myalgias, neck pain and stiffness.  Gastrointestinal: Negative for abdominal pain, bowel incontinence, diarrhea and excessive appetite.  Genitourinary: Negative for decreased libido, genital sores and incomplete emptying.  Neurological: Negative for brief paralysis, focal weakness, headaches and loss of balance.  Psychiatric/Behavioral: Negative for altered mental status, depression and suicidal ideas.  Allergic/Immunologic: Negative for HIV exposure and persistent infections.    EKGs/Labs/Other Studies Reviewed:    The following studies were reviewed today:   EKG:  The ekg ordered today demonstrates   Recent Labs: 05/12/2022: Hemoglobin 13.3; Platelets 325  Recent Lipid Panel    Component Value Date/Time   CHOL 173 11/04/2015 1046   TRIG 95 11/04/2015 1046   HDL 77 11/04/2015 1046   CHOLHDL 2.2 11/04/2015 1046   LDLCALC 78 11/04/2015 1046    Physical Exam:    VS:  BP 110/65 (BP Location: Left Arm, Patient Position: Sitting, Cuff Size: Normal)   Pulse 85   Ht 4\' 10"  (1.473 m)   Wt 97 lb 6.4 oz (44.2 kg)   SpO2 99%   BMI 20.36 kg/m     Wt Readings from Last 3 Encounters:  12/21/22 97 lb 6.4 oz (44.2 kg)  10/26/22 97 lb (44 kg)  10/12/22 97 lb (44 kg)     GEN: Well nourished, well developed in no acute distress HEENT: Normal NECK: No JVD; No carotid bruits LYMPHATICS: No lymphadenopathy CARDIAC: S1S2 noted,RRR, no murmurs, rubs, gallops RESPIRATORY:  Clear to auscultation without rales, wheezing or rhonchi  ABDOMEN: Soft, non-tender, non-distended, +bowel sounds, no guarding. EXTREMITIES: No  edema, No cyanosis, no clubbing MUSCULOSKELETAL:  No deformity  SKIN: Warm and dry NEUROLOGIC:  Alert and oriented x 3, non-focal PSYCHIATRIC:  Normal affect, good insight  ASSESSMENT:    1. Palpitations   2. Ehlers-Danlos syndrome, type 3   3. POTS (postural orthostatic tachycardia syndrome)   4. Electrolyte abnormality    PLAN:     Ehlers-Danlos syndrome-will get a surveillance echo.  With her diagnosis of Ehlers-Danlos she needs serial/annual echoes.  In terms of her POTS we discussed options for treatment.  I do think that ivabradine/corlanor will be a benefit.  We discussed that for now she seems to be tolerating the symptoms.  But when she can no longer tolerate this and wanted consider use of this medication I will be happy to started for her.  She recently had labs which show significant hypokalemia.  She was repleted.  I would like to get lab work  to make sure that she has been sufficiently repleted.  Addendum: 12/26/2022 - I spoke with the patient today about her lab results.  She tells me that she is now considering the use of ivabradine.  I will start the patient on ivabradine 2.5 mg daily.  We also discussed that with her chronic fatigue it would be beneficial if she does not have any relief from the ivabradine terms of improving POTS heart rates and symptoms resolution in terms of the fatigue you will be at her best interest to have fibromyalgia/chronic fatigue syndrome ruled out.  The patient is in agreement with the above plan. The patient left the office in stable condition.  The patient will follow up in   Medication Adjustments/Labs and Tests Ordered: Current medicines are reviewed at length with the patient today.  Concerns regarding medicines are outlined above.  Orders Placed This Encounter  Procedures   Comprehensive Metabolic Panel (CMET)   Magnesium   EKG 12-Lead   ECHOCARDIOGRAM COMPLETE   No orders of the defined types were placed in this  encounter.   Patient Instructions  Medication Instructions:  Your physician recommends that you continue on your current medications as directed. Please refer to the Current Medication list given to you today.  *If you need a refill on your cardiac medications before your next appointment, please call your pharmacy*   Lab Work: CMET, Mag If you have labs (blood work) drawn today and your tests are completely normal, you will receive your results only by: MyChart Message (if you have MyChart) OR A paper copy in the mail If you have any lab test that is abnormal or we need to change your treatment, we will call you to review the results.   Testing/Procedures: Your physician has requested that you have an echocardiogram. Echocardiography is a painless test that uses sound waves to create images of your heart. It provides your doctor with information about the size and shape of your heart and how well your heart's chambers and valves are working. This procedure takes approximately one hour. There are no restrictions for this procedure. Please do NOT wear cologne, perfume, aftershave, or lotions (deodorant is allowed). Please arrive 15 minutes prior to your appointment time.  Please note: We ask at that you not bring children with you during ultrasound (echo/ vascular) testing. Due to room size and safety concerns, children are not allowed in the ultrasound rooms during exams. Our front office staff cannot provide observation of children in our lobby area while testing is being conducted. An adult accompanying a patient to their appointment will only be allowed in the ultrasound room at the discretion of the ultrasound technician under special circumstances. We apologize for any inconvenience.    Follow-Up: At [facility], you and your health needs are our priority.  As part of our continuing mission to provide you with exceptional heart care, we have created designated Provider Care  Teams.  These Care Teams include your primary Cardiologist (physician) and Advanced Practice Providers (APPs -  Physician Assistants and Nurse Practitioners) who all work together to provide you with the care you need, when you need it.  Your next appointment:   1 year(s)  Provider:   Thomasene Ripple, DO      Adopting a Healthy Lifestyle.  Know what a healthy weight is for you (roughly BMI <25) and aim to maintain this   Aim for 7+ servings of fruits and vegetables daily   65-80+ fluid ounces of  water or unsweet tea for healthy kidneys   Limit to max 1 drink of alcohol per day; avoid smoking/tobacco   Limit animal fats in diet for cholesterol and heart health - choose grass fed whenever available   Avoid highly processed foods, and foods high in saturated/trans fats   Aim for low stress - take time to unwind and care for your mental health   Aim for 150 min of moderate intensity exercise weekly for heart health, and weights twice weekly for bone health   Aim for 7-9 hours of sleep daily   When it comes to diets, agreement about the perfect plan isnt easy to find, even among the experts. Experts at the [facility] of [employer] developed an idea known as the Healthy Eating Plate. Just imagine a plate divided into logical, healthy portions.   The emphasis is on diet quality:   Load up on vegetables and fruits - one-half of your plate: Aim for color and variety, and remember that potatoes dont count.   Go for whole grains - one-quarter of your plate: Whole wheat, barley, wheat berries, quinoa, oats, brown rice, and foods made with them. If you want pasta, go with whole wheat pasta.   Protein power - one-quarter of your plate: Fish, chicken, beans, and nuts are all healthy, versatile protein sources. Limit red meat.   The diet, however, does go beyond the plate, offering a few other suggestions.   Use healthy plant oils, such as olive, canola, soy, corn, sunflower and peanut.  Check the labels, and avoid partially hydrogenated oil, which have unhealthy trans fats.   If youre thirsty, drink water. Coffee and tea are good in moderation, but skip sugary drinks and limit milk and dairy products to one or two daily servings.   The type of carbohydrate in the diet is more important than the amount. Some sources of carbohydrates, such as vegetables, fruits, whole grains, and beans-are healthier than others.   Finally, stay active  Signed, Thomasene Ripple, DO  12/21/2022 9:42 AM    [facility]

## 2022-12-22 LAB — COMPREHENSIVE METABOLIC PANEL
ALT: 15 [IU]/L (ref 0–32)
AST: 20 [IU]/L (ref 0–40)
Albumin: 4.4 g/dL (ref 3.8–4.9)
Alkaline Phosphatase: 33 [IU]/L — ABNORMAL LOW (ref 44–121)
BUN/Creatinine Ratio: 8 — ABNORMAL LOW (ref 9–23)
BUN: 6 mg/dL (ref 6–24)
Bilirubin Total: 0.3 mg/dL (ref 0.0–1.2)
CO2: 23 mmol/L (ref 20–29)
Calcium: 9.3 mg/dL (ref 8.7–10.2)
Chloride: 104 mmol/L (ref 96–106)
Creatinine, Ser: 0.77 mg/dL (ref 0.57–1.00)
Globulin, Total: 2.7 g/dL (ref 1.5–4.5)
Glucose: 87 mg/dL (ref 70–99)
Potassium: 3.8 mmol/L (ref 3.5–5.2)
Sodium: 141 mmol/L (ref 134–144)
Total Protein: 7.1 g/dL (ref 6.0–8.5)
eGFR: 89 mL/min/{1.73_m2} (ref >59)

## 2022-12-22 LAB — MAGNESIUM: Magnesium: 2 mg/dL (ref 1.6–2.3)

## 2022-12-23 ENCOUNTER — Ambulatory Visit: Payer: BC Managed Care – PPO | Admitting: Physical Therapy

## 2022-12-23 ENCOUNTER — Other Ambulatory Visit: Payer: Self-pay | Admitting: Sports Medicine

## 2022-12-23 ENCOUNTER — Encounter: Payer: Self-pay | Admitting: Physical Therapy

## 2022-12-23 ENCOUNTER — Ambulatory Visit: Payer: BC Managed Care – PPO | Attending: Sports Medicine | Admitting: Physical Therapy

## 2022-12-23 DIAGNOSIS — R262 Difficulty in walking, not elsewhere classified: Secondary | ICD-10-CM | POA: Insufficient documentation

## 2022-12-23 DIAGNOSIS — M6281 Muscle weakness (generalized): Secondary | ICD-10-CM | POA: Insufficient documentation

## 2022-12-23 DIAGNOSIS — R252 Cramp and spasm: Secondary | ICD-10-CM | POA: Diagnosis not present

## 2022-12-23 DIAGNOSIS — R293 Abnormal posture: Secondary | ICD-10-CM | POA: Diagnosis not present

## 2022-12-23 DIAGNOSIS — Q796 Ehlers-Danlos syndrome, unspecified: Secondary | ICD-10-CM | POA: Insufficient documentation

## 2022-12-23 MED ORDER — DIAZEPAM 5 MG PO TABS: ORAL_TABLET | ORAL | 3 refills | Status: DC

## 2022-12-23 NOTE — Therapy (Signed)
OUTPATIENT PHYSICAL THERAPY LOWER EXTREMITY    Patient Name: Marissa [name] MRN: [id number] DOB:1965/05/07, 57 y.o., female Today's Date: 12/23/2022  END OF SESSION:  PT End of Session - 12/23/22 1546     Visit Number 10    Date for PT Re-Evaluation 02/08/23    Authorization Type BCBS    PT Start Time 1430    PT Stop Time 1510    PT Time Calculation (min) 40 min    Activity Tolerance Patient limited by pain    Behavior During Therapy [facility] for tasks assessed/performed                   Past Medical History:  Diagnosis Date   Ehlers-Danlos syndrome    History of pituitary tumor    Ocular migraine    Past Surgical History:  Procedure Laterality Date   KNEE SURGERY N/A 2012   SHOULDER SURGERY N/A 2009   UTERINE FIBROID SURGERY N/A    Patient Active Problem List   Diagnosis Date Noted   Subluxation 04/07/2022   Right hip subluxation, sequela 02/15/2022   Asthma 11/11/2021   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   Finger pain, right 07/15/2021   Knee pain, left 04/25/2019   Bicipital tendonitis of left shoulder 04/25/2019   Varicose veins of bilateral lower extremities with other complications 10/25/2018   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   POTS (postural orthostatic tachycardia syndrome) 08/29/2017   Chronic pain syndrome 08/29/2017   Chronic insomnia 08/29/2017   Wrist pain, acute 06/02/2016   Enthesopathy of right shoulder 01/28/2016   Right shoulder pain 01/28/2016   Family history of early CAD 11/30/2015   Cervical disc disorder with radiculopathy of cervical region 05/13/2014   Chronic right SI joint pain 08/28/2013   Hip pain, right 06/25/2013   Elbow pain, right 02/13/2013   Bunionette 01/07/2013   Ehlers-Danlos disease 11/08/2012   Unspecified visual disturbance 04/19/2012   Pituitary adenoma with extrasellar extension (HCC) 04/19/2012    PCP: Maurice Small, MD   REFERRING PROVIDER: Enid Baas, MD  REFERRING DIAG: [phone] (ICD-10-CM)  - Ehlers-Danlos disease M25.551 (ICD-10-CM) - Hip pain, right M79.18 (ICD-10-CM) - Myalgia, multiple sites  THERAPY DIAG:  Muscle weakness (generalized)  Difficulty in walking, not elsewhere classified  Cramp and spasm  Ehlers-Danlos syndrome  Abnormal posture  Rationale for Evaluation and Treatment: Rehabilitation  ONSET DATE: 2005 (1990 diagnosed with POTS)  SUBJECTIVE:   SUBJECTIVE STATEMENT: A lot of SI pain lately; deep ache. RT hip more acute pain when standing.  PERTINENT HISTORY: POTS as well PAIN:  12/14/22:   Are you having pain? Yes: NPRS scale: 8-9/10 Pain location: all over RT hipI& SI Pain description: aching, numbness Aggravating factors: everything Relieving factors: once in a while,  pilates helps, she does this on a regular basis  PRECAUTIONS: Other: ED , POTS, "Mast cell activation disorder"  RED FLAGS: None   WEIGHT BEARING RESTRICTIONS: No  FALLS:  Has patient fallen in last 6 months? Yes. Number of falls 3 (1 on lyrica, and the other 2 from hip and knee subluxations)  LIVING ENVIRONMENT: Lives with: lives alone Lives in: House/apartment Has upstairs but could live downstairs if needed  OCCUPATION: Child psychotherapist, Engineer, structural (lots of driving, standing, giving talks)  PLOF: Independent, Independent with basic ADLs, Independent with household mobility without device, Independent with community mobility without device, Independent with gait, and Independent with transfers  PATIENT GOALS: decreased pain  NEXT MD VISIT: prn  OBJECTIVE:  DIAGNOSTIC FINDINGS: Multiple cervical spine and many other diagnostics on this patient.  See in chart review.    COGNITION: Overall cognitive status: Within functional limits for tasks assessed     SENSATION: WFL  MUSCLE LENGTH: Hamstrings: Right approx 45 deg; Left approx 50 deg Thomas test: Right neg; Left neg  POSTURE: No Significant postural limitations  LOWER EXTREMITY  ROM:  WFL (left hip slightly limited)  LOWER EXTREMITY MMT:  Generally 4+ to 5/5   FUNCTIONAL TESTS:  Initial eval: 5 times sit to stand: 6.61 sec Timed up and go (TUG): 6.55 sec  12/14/22: 5 times sit to stand: 6.58 sec Timed up and go (TUG): 5.34 sec  GAIT: Distance walked: 30 Assistive device utilized: None Level of assistance: Complete Independence Comments: normal   TODAY'S TREATMENT:   12/23/22:Pt arrives for aquatic physical therapy. Treatment took place in 3.5-5.5 feet of water. Water temperature was 90 degrees . Pt entered the pool via stairs reciprocally but slowly with use of bil rails. Pt requires buoyancy of water for support and to offload joints with strengthening exercises.  Seated water bench with 75% submersion Pt performed seated LE AROM exercises 10x2 in all planes with concurrent discussion of current status and how she did after last session. Water walking at 75% depth 6-8 lengths each with 1-2 min rest in between. Standing in 75% depth with arm flex/ext with glute & TA contraction 10x2. Step ups on first step holding onto the rail: Lt 10x2, RT 2x10.   12/14/22: Recert visit: reassessment completed Discussed options and progress  12/07/22:Pt arrives for aquatic physical therapy. Treatment took place in 3.5-5.5 feet of water. Water temperature was 90 degrees . Pt entered the pool via stairs reciprocally but slowly with use of bil rails. Pt requires buoyancy of water for support and to offload joints with strengthening exercises.  Seated water bench with 75% submersion Pt performed seated LE AROM exercises 10x2 in all planes with concurrent discussion of current status and how she did after last session. Water walking at 75% depth 8 lengths each with 1-2 min rest in between. Standing in 75% depth with arm flex/ext with glute & TA contraction 10x2. Step ups on first step holding onto the rail: Lt 10x, RT 2x5. Front plank at wall: Vc to contract glutes as pt  releases 1 arm and attempts to stand erect. 5x with TC at chest wall to not over arch at thoracic.                                             PATIENT EDUCATION:  Education details: explained that pool may allow for more ease of movement but will not likely help with joint stability  Person educated: Patient Education method: Explanation Education comprehension: verbalized understanding  HOME EXERCISE PROGRAM: Did not issue, patient speaks of multiple attempts at exercises for stability.  She feels that no one understands her condition well.  She described exercises that evaluating PT would have recommended but she felt these were contraindicated for her.  The only person she has had a little success with is her Gaffer.  Since patient will be doing pool only, we held on any land HEP exercises.   ASSESSMENT:  CLINICAL IMPRESSION: Pt with  a lot of SI pain lately so standing and walking in the pool were much better than sitting. Pt was able  to complete all her water walking without too much increase in pain. Pt likely to join [facility] to continue with her aquatic exercise.  OBJECTIVE IMPAIRMENTS: decreased activity tolerance, impaired perceived functional ability, and pain.   ACTIVITY LIMITATIONS: carrying, lifting, bending, standing, squatting, sleeping, stairs, transfers, bed mobility, continence, bathing, toileting, dressing, self feeding, reach over head, and hygiene/grooming  PARTICIPATION LIMITATIONS: meal prep, cleaning, laundry, driving, shopping, community activity, occupation, yard work, and church  PERSONAL FACTORS: Behavior pattern, Past/current experiences, Profession, Time since onset of injury/illness/exacerbation, and 3+ comorbidities: see history POTS, Mast cell disorder, and multiple others  are also affecting patient's functional outcome.   REHAB POTENTIAL: Poor due to chronic nature of her condition  CLINICAL DECISION MAKING:  Unstable/unpredictable  EVALUATION COMPLEXITY: High   GOALS: Goals reviewed with patient? Yes  SHORT TERM GOALS: Target date: 11/11/2022  25% improvement in pain Baseline: Goal status: INITIAL  2.  Patient to report pool as an option for managing her E.D. Baseline:  Goal status: Goal met 11/02/22  3. Patient will report no cervical or LTUE exacerbations with aquatic exercises.  Baseline:  Goal Status: Met 12/07/22   LONG TERM GOALS: Target date: 12/09/2022   Patient to report 30% improvement in pain Baseline:  Goal status: INITIAL  2.  Patient to be able to join pool program for continued pain control and management of symptoms Baseline:  Goal status: INITIAL  PLAN:  PT FREQUENCY: 1x/week  PT DURATION: 8 weeks  PLANNED INTERVENTIONS: Therapeutic exercises, Therapeutic activity, Neuromuscular re-education, Balance training, Gait training, Patient/Family education, Self Care, Joint mobilization, Stair training, DME instructions, Aquatic Therapy, Manual therapy, and Re-evaluation  PLAN FOR NEXT SESSION:  Discharge next visit  12/23/22 3:48 PM   [facility] [address] [place], [place] [address] Phone # [phone] Fax [phone]

## 2022-12-26 ENCOUNTER — Ambulatory Visit
Admission: RE | Admit: 2022-12-26 | Discharge: 2022-12-26 | Disposition: A | Discharge status: Home / Self Care | Payer: BC Managed Care – PPO | Source: Ambulatory Visit | Attending: Family Medicine | Admitting: Family Medicine

## 2022-12-26 ENCOUNTER — Other Ambulatory Visit: Payer: Self-pay | Admitting: Family Medicine

## 2022-12-26 ENCOUNTER — Encounter: Payer: Self-pay | Admitting: Family Medicine

## 2022-12-26 ENCOUNTER — Ambulatory Visit (INDEPENDENT_AMBULATORY_CARE_PROVIDER_SITE_OTHER): Payer: BC Managed Care – PPO | Admitting: Family Medicine

## 2022-12-26 VITALS — BP 110/72 | Ht 58.5 in | Wt 96.0 lb

## 2022-12-26 DIAGNOSIS — Q796 Ehlers-Danlos syndrome, unspecified: Secondary | ICD-10-CM

## 2022-12-26 DIAGNOSIS — M533 Sacrococcygeal disorders, not elsewhere classified: Secondary | ICD-10-CM

## 2022-12-26 DIAGNOSIS — S73001D Unspecified subluxation of right hip, subsequent encounter: Secondary | ICD-10-CM

## 2022-12-26 DIAGNOSIS — S73001S Unspecified subluxation of right hip, sequela: Secondary | ICD-10-CM

## 2022-12-26 DIAGNOSIS — M47816 Spondylosis without myelopathy or radiculopathy, lumbar region: Secondary | ICD-10-CM | POA: Diagnosis not present

## 2022-12-26 DIAGNOSIS — G8929 Other chronic pain: Secondary | ICD-10-CM

## 2022-12-26 DIAGNOSIS — M25552 Pain in left hip: Secondary | ICD-10-CM | POA: Diagnosis not present

## 2022-12-26 DIAGNOSIS — M25551 Pain in right hip: Secondary | ICD-10-CM | POA: Diagnosis not present

## 2022-12-26 NOTE — Assessment & Plan Note (Signed)
Chronic right sided hip and SI joint pain with associated current subluxations related to underlying Ehlers-Danlos -Limited improvement with shockwave therapy, aquatic therapy, trigger point injections -Needed to stop oral NSAIDs due to decreased iron and hemoglobin levels -No prior imaging of the hips or the lumbar spine, concern for possible underlying osteoarthritis or other abnormality related to increased movement around the joint from her EDS  Plan: -Reviewed prior visit notes with Dr. Darrick Penna during the visit today -Imaging: Bilateral hip, pelvis, L-spine x-rays to rule out underlying osteoarthritis or other abnormalities.  She has had oral steroids in the past, we will also exclude AVN -She will continue her naloxone and diazepam as needed.  She received refills on these the end of last week -She will continue to aquatic activities as tolerated -Follow-up pending x-ray results

## 2022-12-26 NOTE — Assessment & Plan Note (Signed)
Leading to recurrent right hip subluxations, has been following with Dr. Darrick Penna for this, as well as cardiology  Plan: -Continue to follow-up with cardiology as scheduled, has upcoming echocardiogram for surveillance

## 2022-12-26 NOTE — Progress Notes (Signed)
DATE OF VISIT: 12/26/2022        Marissa [name] DOB: 14-Apr-1965 MRN: [id number]  CC:  f/u hip and SI joint pain  History of present Illness: Marissa [name] is a 57 y.o. female who presents for a follow-up visit  PMH significant for Ehlers-Danlos with increasing MSK pain  Last seen by Dr Darrick Penna 10/26/22 for hip subluxation - has tried shockwave tx which was not helpful - tried trigger point injections which were not helpful - unable to tolerate Lyrica in the past due to side effects - has been using low-dose Naltrexone 6mg  bid + Voltaren 50mg  bid - using Diazepam prn for muscle spasms - helps better than other medications - has tried back bracing in the past - was not helpful - last visit recommended to cont with Aqua therapy for total of at least 8 sessions  Today she reports finished last PT session - felt better in the water, no improvement with land-based activity - joined pool to be able to do more water-based  Needed to stop to Voltaren due to drop in hemoglobin - had low iron and hemoglobin -- improved once off Voltaren - was also having GI distress Still taking Naltrexone bid Using Lidocaine patch prn - will cause some skin irritation - following with Allergist - remote hx of Erythema Multiforme after adjustment of Doxepin  Rt SI joint is still most problematic area Radiates down IT band and Rt leg Disrupting sleep Worse when driving in the car No numbness/tingling Hx Prednisone usage 1-2 times/year over the years No recent imaging of hip or low back  Will be starting new medication for POTS  Works in Hospice     Medications:  Outpatient Encounter Medications as of 12/26/2022  Medication Sig   AMBULATORY NON FORMULARY MEDICATION Naltrexone 1mg /cc  4cc three times daily   diazepam (VALIUM) 5 MG tablet 1 po tid prn muscle spasm   Fexofenadine HCl (ALLEGRA ALLERGY PO) Allegra Allergy   hydrOXYzine (ATARAX) 10 MG tablet Take 1 tablet (10 mg total) by mouth 4 (four) times  daily as needed.   levocetirizine (XYZAL) 5 MG tablet Take 5 mg by mouth every evening.   Multiple Vitamin (MULTIVITAMIN) capsule Take 1 capsule by mouth daily.   norgestrel-ethinyl estradiol (CRYSELLE-28) 0.3-30 MG-MCG tablet Cryselle (28) 0.3 mg-30 mcg tablet  TAKE 1 TABLET BY MOUTH EVERY DAY CONTINUOUSLY   SYMBICORT 80-4.5 MCG/ACT inhaler Inhale into the lungs.   VENTOLIN HFA 108 (90 Base) MCG/ACT inhaler Inhale 2 puffs into the lungs every 4 (four) hours as needed.   XOLAIR 150 MG/ML prefilled syringe    zolpidem (AMBIEN) 10 MG tablet TAKE 1/2-1 TABLETS BY MOUTH AT BEDTIME AS NEEDED   [DISCONTINUED] diclofenac (VOLTAREN) 50 MG EC tablet Take 1 tablet (50 mg total) by mouth 2 (two) times daily.   [DISCONTINUED] predniSONE (DELTASONE) 20 MG tablet Take 1 tablet (20 mg total) by mouth 2 (two) times daily.   [DISCONTINUED] pregabalin (LYRICA) 50 MG capsule Take 1 capsule (50 mg total) by mouth 3 (three) times daily.   No facility-administered encounter medications on file as of 12/26/2022.    Allergies: is allergic to macrobid [nitrofurantoin monohyd macro] and doxepin.  Physical Examination: Vitals: BP 110/72   Ht 4' 10.5" (1.486 m)   Wt 96 lb (43.5 kg)   BMI 19.72 kg/m  GENERAL:  Marissa [name] is a 57 y.o. female appearing their stated age, alert and oriented x 3, in no apparent distress.  SKIN: no  rashes or lesions, skin clean, dry, intact MSK:  L spine with full range of motion with mild right-sided SI joint pain at terminal flexion and extension.  Mildly tender to palpation over the right SI joint.  No increased redness or warmth.  No midline or paraspinal tenderness otherwise.  Right-sided SI joint pain with FABER.  Negative SLR bilaterally. Right hip with good range of motion with some pain along the anterior hip with internal and external rotation.  Minimal tenderness over the right greater trochanter, no tenderness over the left greater trochanter.  Hip strength 5/5 throughout.   Pelvic rocking with good SI joint mobility bilaterally Walking without a limp NEURO: sensation intact to light touch, DTR 2/4 Achilles and patella bilaterally VASC: no edema   Assessment & Plan Right hip subluxation, sequela Chronic right sided hip and SI joint pain with associated current subluxations related to underlying Ehlers-Danlos -Limited improvement with shockwave therapy, aquatic therapy, trigger point injections -Needed to stop oral NSAIDs due to decreased iron and hemoglobin levels -No prior imaging of the hips or the lumbar spine, concern for possible underlying osteoarthritis or other abnormality related to increased movement around the joint from her EDS  Plan: -Reviewed prior visit notes with Dr. Darrick Penna during the visit today -Imaging: Bilateral hip, pelvis, L-spine x-rays to rule out underlying osteoarthritis or other abnormalities.  She has had oral steroids in the past, we will also exclude AVN -She will continue her naloxone and diazepam as needed.  She received refills on these the end of last week -She will continue to aquatic activities as tolerated -Follow-up pending x-ray results Ehlers-Danlos disease Leading to recurrent right hip subluxations, has been following with Dr. Darrick Penna for this, as well as cardiology  Plan: -Continue to follow-up with cardiology as scheduled, has upcoming echocardiogram for surveillance Chronic right SI joint pain Chronic right sided hip and SI joint pain with associated current subluxations related to underlying Ehlers-Danlos -Limited improvement with shockwave therapy, aquatic therapy, trigger point injections -Needed to stop oral NSAIDs due to decreased iron and hemoglobin levels -No prior imaging of the hips or the lumbar spine, concern for possible underlying osteoarthritis or other abnormality related to increased movement around the joint from her EDS  Plan: -Reviewed prior visit notes with Dr. Darrick Penna during the visit  today -Imaging: Bilateral hip, pelvis, L-spine x-rays to rule out underlying osteoarthritis or other abnormalities.  She has had oral steroids in the past, we will also exclude AVN -She will continue her naloxone and diazepam as needed.  She received refills on these the end of last week -She will continue to aquatic activities as tolerated -Follow-up pending x-ray results   Patient expressed understanding & agreement with above.  Encounter Diagnoses  Name Primary?   Right hip subluxation, sequela Yes   Ehlers-Danlos disease    Chronic right SI joint pain     Orders Placed This Encounter  Procedures   DG Lumbar Spine Complete   DG HIPS BILAT WITH PELVIS 3-4 VIEWS

## 2022-12-27 ENCOUNTER — Ambulatory Visit: Payer: BC Managed Care – PPO | Admitting: Sports Medicine

## 2022-12-27 ENCOUNTER — Other Ambulatory Visit: Payer: Self-pay

## 2022-12-27 ENCOUNTER — Ambulatory Visit: Payer: BC Managed Care – PPO | Admitting: Family Medicine

## 2022-12-27 MED ORDER — IVABRADINE HCL 5 MG PO TABS
2.5000 mg | ORAL_TABLET | Freq: Every day | ORAL | 3 refills | Status: DC
Start: 2022-12-27 — End: 2023-02-20

## 2022-12-27 NOTE — Therapy (Unsigned)
OUTPATIENT PHYSICAL THERAPY LOWER EXTREMITY    Patient Name: Marissa [name] MRN: [id number] DOB:Oct 14, 1965, 57 y.o., female Today's Date: 12/28/2022  END OF SESSION:  PT End of Session - 12/28/22 1106     Visit Number 11    Date for PT Re-Evaluation 02/08/23    Authorization Type BCBS    PT Start Time 0845    PT Stop Time 0930    PT Time Calculation (min) 45 min    Activity Tolerance Patient limited by pain;Patient tolerated treatment well                    Past Medical History:  Diagnosis Date   Ehlers-Danlos syndrome    History of pituitary tumor    Ocular migraine    Past Surgical History:  Procedure Laterality Date   KNEE SURGERY N/A 2012   SHOULDER SURGERY N/A 2009   UTERINE FIBROID SURGERY N/A    Patient Active Problem List   Diagnosis Date Noted   Subluxation 04/07/2022   Right hip subluxation, sequela 02/15/2022   Asthma 11/11/2021   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   Finger pain, right 07/15/2021   Knee pain, left 04/25/2019   Bicipital tendonitis of left shoulder 04/25/2019   Varicose veins of bilateral lower extremities with other complications 10/25/2018   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   POTS (postural orthostatic tachycardia syndrome) 08/29/2017   Chronic pain syndrome 08/29/2017   Chronic insomnia 08/29/2017   Wrist pain, acute 06/02/2016   Enthesopathy of right shoulder 01/28/2016   Right shoulder pain 01/28/2016   Family history of early CAD 11/30/2015   Cervical disc disorder with radiculopathy of cervical region 05/13/2014   Chronic right SI joint pain 08/28/2013   Hip pain, right 06/25/2013   Elbow pain, right 02/13/2013   Bunionette 01/07/2013   Ehlers-Danlos disease 11/08/2012   Unspecified visual disturbance 04/19/2012   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   PERTINENT HISTORY: POTS as well PAIN:  12/14/22:   Are you having pain? Yes: NPRS scale: 8-9/10 Pain location: all over RT hipI& SI Pain description: aching, numbness Aggravating factors: everything Relieving factors: once in a while,  pilates helps, she does this on a regular basis  PRECAUTIONS: Other: ED , POTS, "Mast cell activation disorder"  RED FLAGS: None   WEIGHT BEARING RESTRICTIONS: No  FALLS:  Has patient fallen in last 6 months? Yes. Number of falls 3 (1 on lyrica, and the other 2 from hip and knee subluxations)  LIVING ENVIRONMENT: Lives with: lives alone Lives in: House/apartment Has upstairs but could live downstairs if needed  OCCUPATION: Child psychotherapist, Engineer, structural (lots of driving, standing, giving talks)  PLOF: Independent, Independent with basic ADLs, Independent with household mobility without device, Independent with community mobility without device, Independent with gait, and Independent with transfers  PATIENT GOALS: decreased pain  NEXT MD VISIT: prn  OBJECTIVE:   DIAGNOSTIC FINDINGS: Multiple cervical spine and many other diagnostics on  this patient.  See in chart review.    COGNITION: Overall cognitive status: Within functional limits for tasks assessed     SENSATION: WFL  MUSCLE LENGTH: Hamstrings: Right approx 45 deg; Left approx 50 deg Thomas test: Right neg; Left neg  POSTURE: No Significant postural limitations  LOWER EXTREMITY ROM:  WFL (left hip slightly  limited)  LOWER EXTREMITY MMT:  Generally 4+ to 5/5   FUNCTIONAL TESTS:  Initial eval: 5 times sit to stand: 6.61 sec Timed up and go (TUG): 6.55 sec  12/14/22: 5 times sit to stand: 6.58 sec Timed up and go (TUG): 5.34 sec  GAIT: Distance walked: 30 Assistive device utilized: None Level of assistance: Complete Independence Comments: normal   TODAY'S TREATMENT:   12/28/22:Pt arrives for aquatic physical therapy. Treatment took place in 3.5-5.5 feet of water. Water temperature was 90 degrees . Pt entered the pool via stairs reciprocally but slowly with use of bil rails. Pt requires buoyancy of water for support and to offload joints with strengthening exercises.  Seated water bench with 75% submersion Pt performed seated LE AROM exercises 10x2 in all planes with concurrent discussion of current status and how she did after last session proceeded by review of what pt did in the water on Sunday and how that went. Added Bil knee to chest holding onto large noodle small ROM hip 3 ways and single limb stance.  Water walking at 75% depth 6-8 lengths each with 1-2 min rest in between. Standing in 75% depth with arm flex/ext with glute & TA contraction 10x2. Step ups on first step holding onto the rail: Lt 10x2, RT 2x10.   12/23/22:Pt arrives for aquatic physical therapy. Treatment took place in 3.5-5.5 feet of water. Water temperature was 90 degrees . Pt entered the pool via stairs reciprocally but slowly with use of bil rails. Pt requires buoyancy of water for support and to offload joints with strengthening exercises.  Seated water bench with 75% submersion Pt performed seated LE AROM exercises 10x2 in all planes with concurrent discussion of current status and how she did after last session. Water walking at 75% depth 6-8 lengths each with 1-2 min rest in between. Standing in 75% depth with arm flex/ext with glute & TA contraction 10x2. Step ups on first step holding onto the rail: Lt 10x2, RT  2x10.   12/14/22: Recert visit: reassessment completed Discussed options and progress                                            PATIENT EDUCATION:  Education details: explained that pool may allow for more ease of movement but will not likely help with joint stability  Person educated: Patient Education method: Explanation Education comprehension: verbalized understanding  HOME EXERCISE PROGRAM: Did not issue, patient speaks of multiple attempts at exercises for stability.  She feels that no one understands her condition well.  She described exercises that evaluating PT would have recommended but she felt these were contraindicated for her.  The only person she has had a little success with is her Gaffer.  Since patient will be doing pool only, we held on any land HEP exercises.   ASSESSMENT:  CLINICAL IMPRESSION: Pt has joined [employer] and went this past Sunday to do her aquatic exercises. She was successful in implementing her current program. Today we discussed adding a few others  she is familiar with. Pt is agreaable to discharge today.  OBJECTIVE IMPAIRMENTS: decreased activity tolerance, impaired perceived functional ability, and pain.   ACTIVITY LIMITATIONS: carrying, lifting, bending, standing, squatting, sleeping, stairs, transfers, bed mobility, continence, bathing, toileting, dressing, self feeding, reach over head, and hygiene/grooming  PARTICIPATION LIMITATIONS: meal prep, cleaning, laundry, driving, shopping, community activity, occupation, yard work, and church  PERSONAL FACTORS: Behavior pattern, Past/current experiences, Profession, Time since onset of injury/illness/exacerbation, and 3+ comorbidities: see history POTS, Mast cell disorder, and multiple others  are also affecting patient's functional outcome.   REHAB POTENTIAL: Poor due to chronic nature of her condition  CLINICAL DECISION MAKING: Unstable/unpredictable  EVALUATION COMPLEXITY:  High   GOALS: Goals reviewed with patient? Yes  SHORT TERM GOALS: Target date: 11/11/2022  25% improvement in pain Baseline: Goal status: Not met  2.  Patient to report pool as an option for managing her E.D. Baseline:  Goal status: Goal met 11/02/22  3. Patient will report no cervical or LTUE exacerbations with aquatic exercises.  Baseline:  Goal Status: Met 12/07/22   LONG TERM GOALS: Target date: 12/09/2022   Patient to report 30% improvement in pain Baseline:  Goal status: Not met  2.  Patient to be able to join pool program for continued pain control and management of symptoms Baseline:  Goal status: Goal met 12/28/22  PLAN:  PT FREQUENCY: 1x/week  PT DURATION: 8 weeks  PLANNED INTERVENTIONS: Therapeutic exercises, Therapeutic activity, Neuromuscular re-education, Balance training, Gait training, Patient/Family education, Self Care, Joint mobilization, Stair training, DME instructions, Aquatic Therapy, Manual therapy, and Re-evaluation  PLAN FOR NEXT SESSION:  Discharge  Ane Payment, PTA 12/28/22 11:08 AM   12/28/22 11:08 AM   [facility] [address], [address] [place], [place] [address] Phone # [phone] Fax [phone]

## 2022-12-27 NOTE — Progress Notes (Signed)
Order for Ivabradine 2.5 mg once daily sent to preferred pharmacy. See message below: Tobb, Lavona Mound, DO  Mckoy Bhakta M, RN Please send her 2.5 mg daily of Ivabradine

## 2022-12-28 ENCOUNTER — Encounter: Payer: Self-pay | Admitting: Cardiology

## 2022-12-28 ENCOUNTER — Ambulatory Visit: Payer: BC Managed Care – PPO | Admitting: Sports Medicine

## 2022-12-28 ENCOUNTER — Encounter: Payer: Self-pay | Admitting: Physical Therapy

## 2022-12-28 ENCOUNTER — Ambulatory Visit: Payer: BC Managed Care – PPO | Admitting: Physical Therapy

## 2022-12-28 DIAGNOSIS — R293 Abnormal posture: Secondary | ICD-10-CM | POA: Diagnosis not present

## 2022-12-28 DIAGNOSIS — R252 Cramp and spasm: Secondary | ICD-10-CM

## 2022-12-28 DIAGNOSIS — M6281 Muscle weakness (generalized): Secondary | ICD-10-CM | POA: Diagnosis not present

## 2022-12-28 DIAGNOSIS — Q796 Ehlers-Danlos syndrome, unspecified: Secondary | ICD-10-CM | POA: Diagnosis not present

## 2022-12-28 DIAGNOSIS — R262 Difficulty in walking, not elsewhere classified: Secondary | ICD-10-CM | POA: Diagnosis not present

## 2022-12-29 ENCOUNTER — Other Ambulatory Visit (HOSPITAL_COMMUNITY): Payer: Self-pay

## 2022-12-29 ENCOUNTER — Telehealth: Payer: Self-pay

## 2022-12-29 NOTE — Telephone Encounter (Signed)
Pharmacy Patient Advocate Encounter   Received notification from Physician's Office that prior authorization for IVABRADINE 5 MG is required/requested.   Insurance verification completed.   The patient is insured through [facility] .   Per test claim: PA required; PA submitted to above mentioned insurance via CoverMyMeds Key/confirmation #/EOC [id number] Status is pending

## 2022-12-29 NOTE — Telephone Encounter (Signed)
PA request has been Submitted. New Encounter created for follow up. For additional info see Pharmacy Prior Auth telephone encounter from 12/29/22.

## 2022-12-30 NOTE — Unmapped (Signed)
[facility] Refill Coordination Note    Natasha Copeland, DOB: 10/06/1965  Phone: [phone] (home)       All above HIPAA information was verified with patient.         12/30/2022     9:26 AM   Specialty Rx Medication Refill Questionnaire   Which Medications would you like refilled and shipped? Xolair   Please list all current allergies: Doxipen   Have you missed any doses in the last 30 days? No   Have you had any changes to your medication(s) since your last refill? No   How many days remaining of each medication do you have at home? 0   If receiving an injectable medication, next injection date is 01/16/2023   Have you experienced any side effects in the last 30 days? No   Please enter the full address (street address, city, state, zip code) where you would like your medication(s) to be delivered to. [address], [place], [place] [address]   Please specify on which day you would like your medication(s) to arrive. Note: if you need your medication(s) within 3 days, please call the pharmacy to schedule your order at [phone]  01/09/2023   Has your insurance changed since your last refill? No   Would you like a pharmacist to call you to discuss your medication(s)? No   Do you require a signature for your package? (Note: if we are billing Medicare Part B or your order contains a controlled substance, we will require a signature) No         Completed refill call assessment today to schedule patient's medication shipment from the [facility] [phone]).  All relevant notes have been reviewed.       Confirmed patient received a Conservation officer, historic buildings and a Surveyor, mining with first shipment. The patient will receive a drug information handout for each medication shipped and additional FDA Medication Guides as required.         REFERRAL TO PHARMACIST     Referral to the pharmacist: Not needed      [facility]     Shipping address confirmed in Epic.     Delivery Scheduled: Yes, Expected medication delivery date: 01/10/2023.     Medication will be delivered via UPS to the prescription address in Epic WAM.    Dorisann Frames   [facility] Specialty Technician

## 2023-01-02 NOTE — Telephone Encounter (Signed)
Pharmacy Patient Advocate Encounter  Received notification from [facility] that Prior Authorization for IVABRADINE has been DENIED.  Full denial letter will be uploaded to the media tab. See denial reason below. Per plan, POTS does not meet plans definition of medical necessity. Plan only covers this drug for patients with heart failure NYHA class 2, 3, or 4, with a left ventricular ejection fraction less than or equal to 35%.

## 2023-01-03 NOTE — Telephone Encounter (Signed)
This is a Insurance underwriter and will require an appeal letter (full denial on chart media). Please assist.

## 2023-01-04 DIAGNOSIS — F4323 Adjustment disorder with mixed anxiety and depressed mood: Secondary | ICD-10-CM | POA: Diagnosis not present

## 2023-01-09 MED FILL — XOLAIR 300 MG/2 ML SUBCUTANEOUS AUTO-INJECTOR: SUBCUTANEOUS | 28 days supply | Qty: 2 | Fill #5

## 2023-01-10 ENCOUNTER — Encounter: Payer: Self-pay | Admitting: Family Medicine

## 2023-01-10 ENCOUNTER — Ambulatory Visit: Payer: BC Managed Care – PPO | Admitting: Family Medicine

## 2023-01-10 ENCOUNTER — Other Ambulatory Visit: Payer: Self-pay

## 2023-01-10 VITALS — BP 136/87 | Wt 94.0 lb

## 2023-01-10 DIAGNOSIS — G8929 Other chronic pain: Secondary | ICD-10-CM

## 2023-01-10 DIAGNOSIS — M533 Sacrococcygeal disorders, not elsewhere classified: Secondary | ICD-10-CM

## 2023-01-10 MED ORDER — METHYLPREDNISOLONE ACETATE 40 MG/ML IJ SUSP
40.0000 mg | Freq: Once | INTRAMUSCULAR | Status: AC
  Administered 2023-01-10: 40 mg via INTRA_ARTICULAR

## 2023-01-10 NOTE — Patient Instructions (Signed)

## 2023-01-10 NOTE — Progress Notes (Signed)
DATE OF VISIT: 01/10/2023        Marissa [name] DOB: 1965-07-18 MRN: [id number]  CC:  Rt SI joint injection  History of present Illness: Marissa [name] is a 57 y.o. female who presents for a follow-up visit of Rt SI joint pain Last seen by me 12/26/22 Having increasing pain since that visit - after that visit did a lot of driving the following day, then Aquatics therapy the next day.   - noted increased pain by 12/30/22 Having pain with sitting for long periods Improved with standing, needs to stand for meetings No new injury/trauma Previous Rt SIJ injection with Dr Jettie Booze 02/01/22 with good response.  Medications:  Outpatient Encounter Medications as of 01/10/2023  Medication Sig   AMBULATORY NON FORMULARY MEDICATION Naltrexone 1mg /cc  4cc three times daily   diazepam (VALIUM) 5 MG tablet 1 po tid prn muscle spasm   Fexofenadine HCl (ALLEGRA ALLERGY PO) Allegra Allergy   hydrOXYzine (ATARAX) 10 MG tablet Take 1 tablet (10 mg total) by mouth 4 (four) times daily as needed.   ivabradine (CORLANOR) 5 MG TABS tablet Take 0.5 tablets (2.5 mg total) by mouth daily.   levocetirizine (XYZAL) 5 MG tablet Take 5 mg by mouth every evening.   Multiple Vitamin (MULTIVITAMIN) capsule Take 1 capsule by mouth daily.   norgestrel-ethinyl estradiol (CRYSELLE-28) 0.3-30 MG-MCG tablet Cryselle (28) 0.3 mg-30 mcg tablet  TAKE 1 TABLET BY MOUTH EVERY DAY CONTINUOUSLY   SYMBICORT 80-4.5 MCG/ACT inhaler Inhale into the lungs.   VENTOLIN HFA 108 (90 Base) MCG/ACT inhaler Inhale 2 puffs into the lungs every 4 (four) hours as needed.   XOLAIR 150 MG/ML prefilled syringe    zolpidem (AMBIEN) 10 MG tablet TAKE 1/2-1 TABLETS BY MOUTH AT BEDTIME AS NEEDED   [EXPIRED] methylPREDNISolone acetate (DEPO-MEDROL) injection 40 mg    No facility-administered encounter medications on file as of 01/10/2023.    Allergies: is allergic to macrobid [nitrofurantoin monohyd macro] and doxepin.  Physical Examination: Vitals: BP  136/87   Wt 94 lb (42.6 kg)   BMI 19.31 kg/m  GENERAL:  Marissa [name] is a 57 y.o. female appearing their stated age, alert and oriented x 3, in no apparent distress.  SKIN: no rashes or lesions, skin clean, dry, intact MSK: TTP along Rt SI joint, no increased redness or warmth.   N/V/I distally  Assessment & Plan Chronic right SI joint pain Acute on chronic right sided SI joint pain with associated recurrent subluxations related to underlying Ehlers-Danlos -Limited improvement with prior shockwave therapy, aquatic therapy, trigger point injections -Needed to stop oral NSAIDs due to decreased iron and hemoglobin levels  Plan: -She has done well with prior SI joint injection 02/01/2022 with Dr. Darrick Penna.  She would like to proceed with this today.  Procedure completed as noted below  RT Sacroiliac Joint Injection Procedure Note   PRE-OP DIAGNOSIS: RT sacroiliitis          POST-OP DIAGNOSIS: Same    PROCEDURE: RT u/s guided sacroiliac joint injection Performing Physician: Darene Lamer, DO   Dose:  1% Lidocaine - 4ml 40mg /mL Methylprednisolone - 1ml   Procedure: Consent obtained.  The area was prepped in the usual sterile manner with chloroprep. Ethyl chloride used for skin anesthetization.  A 22g 3.5" spinal needle was inserted through the skin and directed under ultrasound guidance to the RT SI joint via a transverse oblique plane approach.  Injectate was injected without issue into the joint and surrounding musculature.  There were no complications during this procedure.  Images saved.   Followup: The patient tolerated the procedure well without complications.  Standard post-procedure care is explained and return precautions are given.  - she will continue her Naloxone and Diazepam prn - f/u with [place] if no improvement in 2-3 weeks   Patient expressed understanding & agreement with above.  Encounter Diagnosis  Name Primary?   Chronic right SI joint pain Yes    Orders Placed This  Encounter  Procedures   [place] LIMITED JOINT SPACE STRUCTURES LOW RIGHT

## 2023-01-10 NOTE — Assessment & Plan Note (Signed)
Acute on chronic right sided SI joint pain with associated recurrent subluxations related to underlying Ehlers-Danlos -Limited improvement with prior shockwave therapy, aquatic therapy, trigger point injections -Needed to stop oral NSAIDs due to decreased iron and hemoglobin levels  Plan: -She has done well with prior SI joint injection 02/01/2022 with Dr. Darrick Penna.  She would like to proceed with this today.  Procedure completed as noted below  RT Sacroiliac Joint Injection Procedure Note   PRE-OP DIAGNOSIS: RT sacroiliitis          POST-OP DIAGNOSIS: Same    PROCEDURE: RT u/s guided sacroiliac joint injection Performing Physician: Darene Lamer, DO   Dose:  1% Lidocaine - 4ml 40mg /mL Methylprednisolone - 1ml   Procedure: Consent obtained.  The area was prepped in the usual sterile manner with chloroprep. Ethyl chloride used for skin anesthetization.  A 22g 3.5" spinal needle was inserted through the skin and directed under ultrasound guidance to the RT SI joint via a transverse oblique plane approach.  Injectate was injected without issue into the joint and surrounding musculature.   There were no complications during this procedure.  Images saved.   Followup: The patient tolerated the procedure well without complications.  Standard post-procedure care is explained and return precautions are given.  - she will continue her Naloxone and Diazepam prn - f/u with [place] if no improvement in 2-3 weeks

## 2023-01-16 ENCOUNTER — Encounter: Payer: Self-pay | Admitting: Internal Medicine

## 2023-01-16 ENCOUNTER — Ambulatory Visit (INDEPENDENT_AMBULATORY_CARE_PROVIDER_SITE_OTHER): Payer: BC Managed Care – PPO | Admitting: Internal Medicine

## 2023-01-16 VITALS — BP 110/68 | HR 94 | Temp 98.9°F | Ht <= 58 in | Wt 98.8 lb

## 2023-01-16 DIAGNOSIS — G90A Postural orthostatic tachycardia syndrome (POTS): Secondary | ICD-10-CM

## 2023-01-16 DIAGNOSIS — Q796 Ehlers-Danlos syndrome, unspecified: Secondary | ICD-10-CM | POA: Diagnosis not present

## 2023-01-16 DIAGNOSIS — D8940 Mast cell activation, unspecified: Secondary | ICD-10-CM

## 2023-01-16 NOTE — Progress Notes (Signed)
Established Patient Office Visit     CC/Reason for Visit: Establish care, discuss chronic medical conditions  HPI: Marissa [name] is a 57 y.o. female who is coming in today for the above mentioned reasons. Past Medical History is significant for: Erler's Danlos, POTS, mast cell activation, nonsecreting pituitary adenoma.  Had anorexia nervosa in her early 55s.  She has routine colonoscopies with Dr. Loreta Ave, her GYN is Dr. Vincente Poli.  She has a lot of joint issues related to her Erler's Danlos.  She follows routinely with sports medicine.  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  Past Medical/Surgical History: Past Medical History:  Diagnosis Date   Allergy    Anemia 10/24   Thought to be related to NSAID   Arthritis    Asthma    Ehlers-Danlos syndrome    GERD (gastroesophageal reflux disease)    History of pituitary tumor    Ocular migraine     Past Surgical History:  Procedure Laterality Date   KNEE SURGERY N/A 01/17/2010   SHOULDER SURGERY N/A 01/18/2007   SPINE SURGERY     C5-6 fusion   UTERINE FIBROID SURGERY N/A     Social History:  reports that she has never smoked. She has never used smokeless tobacco. She reports that she does not drink alcohol and does not use drugs.  Allergies: Allergies  Allergen Reactions   Doxepin Rash    Reaction of Erythema multiforme   Duloxetine Hcl Other (See Comments)   Macrobid [Nitrofurantoin Monohyd Macro]     Family History:  Family History  Problem Relation Age of Onset   Arthritis Mother    Atrial fibrillation Mother    Obesity Mother    Varicose Veins Mother    Arthritis Father    Diabetes Father    Heart disease Father    Hypertension Father    Heart failure Father    Obesity Father    Arthritis Sister    Asthma Sister    Diabetes Sister    Obesity Sister    Breast cancer Neg Hx      Current Outpatient Medications:    AMBULATORY NON FORMULARY MEDICATION, Naltrexone 1mg /cc  4cc three times daily, Disp: 360 mL,  Rfl: 2   diazepam (VALIUM) 5 MG tablet, 1 po tid prn muscle spasm, Disp: 90 tablet, Rfl: 3   Fexofenadine HCl (ALLEGRA ALLERGY PO), Allegra Allergy, Disp: , Rfl:    hydrOXYzine (ATARAX) 10 MG tablet, Take 1 tablet (10 mg total) by mouth 4 (four) times daily as needed., Disp: 90 tablet, Rfl: 1   ivabradine (CORLANOR) 5 MG TABS tablet, Take 0.5 tablets (2.5 mg total) by mouth daily., Disp: 45 tablet, Rfl: 3   levocetirizine (XYZAL) 5 MG tablet, Take 5 mg by mouth every evening., Disp: , Rfl:    Multiple Vitamin (MULTIVITAMIN) capsule, Take 1 capsule by mouth daily., Disp: , Rfl:    norgestrel-ethinyl estradiol (CRYSELLE-28) 0.3-30 MG-MCG tablet, Cryselle (28) 0.3 mg-30 mcg tablet  TAKE 1 TABLET BY MOUTH EVERY DAY CONTINUOUSLY, Disp: , Rfl:    SYMBICORT 80-4.5 MCG/ACT inhaler, Inhale into the lungs., Disp: , Rfl:    VENTOLIN HFA 108 (90 Base) MCG/ACT inhaler, Inhale 2 puffs into the lungs every 4 (four) hours as needed., Disp: , Rfl:    XOLAIR 150 MG/ML prefilled syringe, , Disp: , Rfl:    zolpidem (AMBIEN) 10 MG tablet, TAKE 1/2-1 TABLETS BY MOUTH AT BEDTIME AS NEEDED, Disp: 30 tablet, Rfl: 3  Review of  Systems:  Negative unless indicated in HPI.   Physical Exam: Vitals:   01/16/23 0803  BP: 110/68  Pulse: 94  Temp: 98.9 F (37.2 C)  TempSrc: Oral  SpO2: 97%  Weight: 98 lb 12.8 oz (44.8 kg)  Height: 4\' 10"  (1.473 m)    Body mass index is 20.65 kg/m.   Physical Exam Vitals reviewed.  Constitutional:      Appearance: Normal appearance.  HENT:     Head: Normocephalic and atraumatic.  Eyes:     Conjunctiva/sclera: Conjunctivae normal.  Skin:    General: Skin is warm and dry.  Neurological:     General: No focal deficit present.     Mental Status: She is alert and oriented to person, place, and time.  Psychiatric:        Mood and Affect: Mood normal.        Behavior: Behavior normal.        Thought Content: Thought content normal.        Judgment: Judgment normal.       Impression and Plan:  POTS (postural orthostatic tachycardia syndrome)  Mast cell activation syndrome (HCC)  Ehlers-Danlos disease -     Ambulatory referral to Physical Medicine Rehab   -Refer to [facility] to see if any solutions in regards to joint pain related to chronic hypermobility and Erler's Danlos. -She continues follow-up with allergy for her mast cell activation and is on Xolair as well as with cardiology for her POTS.  Time spent:32 minutes reviewing chart, interviewing and examining patient and formulating plan of care.     Chaya Jan, MD [facility] at [facility]

## 2023-01-19 DIAGNOSIS — F4323 Adjustment disorder with mixed anxiety and depressed mood: Secondary | ICD-10-CM | POA: Diagnosis not present

## 2023-01-20 ENCOUNTER — Ambulatory Visit (INDEPENDENT_AMBULATORY_CARE_PROVIDER_SITE_OTHER): Payer: BC Managed Care – PPO

## 2023-01-20 DIAGNOSIS — Q7962 Hypermobile Ehlers-Danlos syndrome: Secondary | ICD-10-CM | POA: Diagnosis not present

## 2023-01-20 LAB — ECHOCARDIOGRAM COMPLETE
Area-P 1/2: 6.32 cm2
S' Lateral: 2.54 cm

## 2023-01-25 ENCOUNTER — Ambulatory Visit: Payer: BC Managed Care – PPO | Admitting: Internal Medicine

## 2023-01-25 ENCOUNTER — Encounter: Payer: Self-pay | Admitting: Physical Medicine & Rehabilitation

## 2023-01-26 ENCOUNTER — Other Ambulatory Visit (HOSPITAL_COMMUNITY): Payer: Self-pay

## 2023-01-31 ENCOUNTER — Ambulatory Visit (INDEPENDENT_AMBULATORY_CARE_PROVIDER_SITE_OTHER): Payer: BC Managed Care – PPO | Admitting: Sports Medicine

## 2023-01-31 VITALS — BP 118/80 | Ht 58.5 in | Wt 98.0 lb

## 2023-01-31 DIAGNOSIS — G8929 Other chronic pain: Secondary | ICD-10-CM | POA: Diagnosis not present

## 2023-01-31 DIAGNOSIS — M533 Sacrococcygeal disorders, not elsewhere classified: Secondary | ICD-10-CM

## 2023-01-31 NOTE — Unmapped (Signed)
[facility] Refill Coordination Note    Natasha Copeland, DOB: 1965/12/07  Phone: [phone] (home)       All above HIPAA information was verified with patient.         01/30/2023     4:09 PM   Specialty Rx Medication Refill Questionnaire   Which Medications would you like refilled and shipped? Xolair   Please list all current allergies: Doxipen   Have you missed any doses in the last 30 days? No   Have you had any changes to your medication(s) since your last refill? No   How many days remaining of each medication do you have at home? 0   If receiving an injectable medication, next injection date is 02/13/2023   Have you experienced any side effects in the last 30 days? No   Please enter the full address (street address, city, state, zip code) where you would like your medication(s) to be delivered to. [address] [place], [place] [address]   Please specify on which day you would like your medication(s) to arrive. Note: if you need your medication(s) within 3 days, please call the pharmacy to schedule your order at [phone]  02/08/2023   Has your insurance changed since your last refill? No   Would you like a pharmacist to call you to discuss your medication(s)? No   Do you require a signature for your package? (Note: if we are billing Medicare Part B or your order contains a controlled substance, we will require a signature) No         Completed refill call assessment today to schedule patient's medication shipment from the [facility] [phone]).  All relevant notes have been reviewed.       Confirmed patient received a Conservation officer, historic buildings and a Surveyor, mining with first shipment. The patient will receive a drug information handout for each medication shipped and additional FDA Medication Guides as required.         REFERRAL TO PHARMACIST     Referral to the pharmacist: Not needed      [facility]     Shipping address confirmed in Epic.     Delivery Scheduled: Yes, Expected medication delivery date: 02/08/23.     Medication will be delivered via UPS to the prescription address in Epic WAM.    Dan Europe   [facility] Specialty Technician

## 2023-01-31 NOTE — Progress Notes (Signed)
 Chief complaint right SI joint pain  Patient with hypermobile Ehlers-Danlos syndrome She continues to have significant right SI joint pain This is worse with driving and she has been doing more dry lately because of the expansion of the network for [facility] This also becomes painful with standing too long The pain is described as sharp  right over the SI joint This sometimes radiates to her anterior groin or down her lateral leg She saw Dr. Teressa who tried an SI joint injection The injection was painful to her and did not really give her any relief She also is trying water exercises some Pilates and physical therapy exercises with some temporary relief Medications have not been that helpful although she occasionally has taken a tramadol  to take the edge off This is helped but she cannot take this regularly and stay on low-dose naltrexone  Physical exam Pleasant patient in no acute distress but in some discomfort when she is standing on her right leg BP 118/80   Ht 4' 10.5 (1.486 m)   Wt 98 lb (44.5 kg)   BMI 20.13 kg/m  Left shoulder is showing some increased hypermobility and is clicking with full elevation and internal rotation She has good strength but some pain with resistance  Both SI joints appear mobile Hip rotation is good bilaterally with increased motion and pain Is not exacerbated by motion Lying prone her right SI joint is at least a centimeter elevated compared to the left SI joint motion or stretches do cause some pain

## 2023-01-31 NOTE — Patient Instructions (Addendum)
 It was great to see you today! We discussed the following: -Use heat 15-20 mins a couple times a day -Try topicals over SI joint -Use pillow or support for back when driving -Start doing hip rotations and isometric hip abduction  -Knee to chest position pelvic tilts 5 times with deep breathing -Isometrics for left shoulder in external rotation, internal rotation and elevation -Follow up in 1 month

## 2023-01-31 NOTE — Assessment & Plan Note (Signed)
 This is challenging and that she seems to be subluxed on her right SI joint on examination today With her hypermobility I think it slips posteriorly and causes a neuropraxia that gives her pain I did not think that injection or medications would make a lot of difference I did suggest we try to strengthen her hip abduction and her hip rotation primarily with isometric exercises I also gave her a size to reset her pelvic position with knees bilaterally to chest followed by a pelvic tilt to repeat 5 times with deep breathing She may want to try using her SI joint belt again to see if this gives her some symptomatic relief  Do the hip strength exercises and SI joint exercises daily and recheck in 1 month

## 2023-02-07 MED FILL — XOLAIR 300 MG/2 ML SUBCUTANEOUS AUTO-INJECTOR: SUBCUTANEOUS | 28 days supply | Qty: 2 | Fill #6

## 2023-02-14 ENCOUNTER — Other Ambulatory Visit: Payer: Self-pay

## 2023-02-14 MED ORDER — AMBULATORY NON FORMULARY MEDICATION: 4 refills | Status: AC

## 2023-02-20 ENCOUNTER — Ambulatory Visit (INDEPENDENT_AMBULATORY_CARE_PROVIDER_SITE_OTHER): Payer: BC Managed Care – PPO | Admitting: Internal Medicine

## 2023-02-20 ENCOUNTER — Encounter: Payer: Self-pay | Admitting: Internal Medicine

## 2023-02-20 VITALS — BP 120/70 | HR 100 | Temp 98.3°F | Wt 93.3 lb

## 2023-02-20 DIAGNOSIS — H8112 Benign paroxysmal vertigo, left ear: Secondary | ICD-10-CM

## 2023-02-20 NOTE — Progress Notes (Signed)
Established Patient Office Visit     CC/Reason for Visit: Vertigo  HPI: Marissa [name] is a 58 y.o. female who is coming in today for the above mentioned reasons. Past Medical History is significant for: Erler's Danlos, POTS, mast cell activation.  About 3 weeks ago she had a 36-hour episode of severe vertigo like the room spinning around her and nausea.  Then a week and a half ago she had a secondary episode while turning her head to the left.  She has been using some meclizine.   Past Medical/Surgical History: Past Medical History:  Diagnosis Date   Allergy    Anemia 10/24   Thought to be related to NSAID   Arthritis    Asthma    Ehlers-Danlos syndrome    GERD (gastroesophageal reflux disease)    History of pituitary tumor    Ocular migraine     Past Surgical History:  Procedure Laterality Date   KNEE SURGERY N/A 01/17/2010   SHOULDER SURGERY N/A 01/18/2007   SPINE SURGERY     C5-6 fusion   UTERINE FIBROID SURGERY N/A     Social History:  reports that she has never smoked. She has never used smokeless tobacco. She reports that she does not drink alcohol and does not use drugs.  Allergies: Allergies  Allergen Reactions   Doxepin Rash    Reaction of Erythema multiforme   Duloxetine Hcl Other (See Comments)   Macrobid [Nitrofurantoin Monohyd Macro]     Family History:  Family History  Problem Relation Age of Onset   Arthritis Mother    Atrial fibrillation Mother    Obesity Mother    Varicose Veins Mother    Arthritis Father    Diabetes Father    Heart disease Father    Hypertension Father    Heart failure Father    Obesity Father    Arthritis Sister    Asthma Sister    Diabetes Sister    Obesity Sister    Breast cancer Neg Hx      Current Outpatient Medications:    AMBULATORY NON FORMULARY MEDICATION, Naltrexone 1mg /cc  4cc three times daily, Disp: 360 mL, Rfl: 4   diazepam (VALIUM) 5 MG tablet, 1 po tid prn muscle spasm, Disp: 90 tablet, Rfl:  3   Fexofenadine HCl (ALLEGRA ALLERGY PO), Allegra Allergy, Disp: , Rfl:    hydrOXYzine (ATARAX) 10 MG tablet, Take 1 tablet (10 mg total) by mouth 4 (four) times daily as needed., Disp: 90 tablet, Rfl: 1   levocetirizine (XYZAL) 5 MG tablet, Take 5 mg by mouth every evening., Disp: , Rfl:    Multiple Vitamin (MULTIVITAMIN) capsule, Take 1 capsule by mouth daily., Disp: , Rfl:    norgestrel-ethinyl estradiol (CRYSELLE-28) 0.3-30 MG-MCG tablet, Cryselle (28) 0.3 mg-30 mcg tablet  TAKE 1 TABLET BY MOUTH EVERY DAY CONTINUOUSLY, Disp: , Rfl:    SYMBICORT 80-4.5 MCG/ACT inhaler, Inhale into the lungs., Disp: , Rfl:    VENTOLIN HFA 108 (90 Base) MCG/ACT inhaler, Inhale 2 puffs into the lungs every 4 (four) hours as needed., Disp: , Rfl:    XOLAIR 150 MG/ML prefilled syringe, , Disp: , Rfl:    zolpidem (AMBIEN) 10 MG tablet, TAKE 1/2-1 TABLETS BY MOUTH AT BEDTIME AS NEEDED, Disp: 30 tablet, Rfl: 3  Review of Systems:  Negative unless indicated in HPI.   Physical Exam: Vitals:   02/20/23 0705  BP: 120/70  Pulse: 100  Temp: 98.3 F (36.8 C)  TempSrc:  Oral  SpO2: 99%  Weight: 93 lb 4.8 oz (42.3 kg)    Body mass index is 19.17 kg/m.   Physical Exam Vitals reviewed.  Constitutional:      Appearance: Normal appearance.  HENT:     Head: Normocephalic and atraumatic.  Eyes:     Conjunctiva/sclera: Conjunctivae normal.  Skin:    General: Skin is warm and dry.  Neurological:     General: No focal deficit present.     Mental Status: She is alert and oriented to person, place, and time.  Psychiatric:        Mood and Affect: Mood normal.        Behavior: Behavior normal.        Thought Content: Thought content normal.        Judgment: Judgment normal.      Impression and Plan:  BPPV (benign paroxysmal positional vertigo), left -     Referral to Neuro Rehab   -Sounds like left-sided BPPV based on symptoms.  Refer to neuro rehab for vestibular therapy.  She already has meclizine  to use as needed.  Time spent:31 minutes reviewing chart, interviewing and examining patient and formulating plan of care.     Chaya Jan, MD [facility] at [facility]

## 2023-02-22 ENCOUNTER — Telehealth: Payer: Self-pay | Admitting: Pharmacy Technician

## 2023-02-22 ENCOUNTER — Ambulatory Visit: Payer: BC Managed Care – PPO | Admitting: Internal Medicine

## 2023-02-22 ENCOUNTER — Other Ambulatory Visit (HOSPITAL_COMMUNITY): Payer: Self-pay

## 2023-02-22 NOTE — Telephone Encounter (Signed)
 Gave chris this

## 2023-02-23 NOTE — Therapy (Signed)
 OUTPATIENT PHYSICAL THERAPY VESTIBULAR EVALUATION     Patient Name: Marissa [name] MRN: [id number] DOB:05-May-1965, 58 y.o., female Today's Date: 02/27/2023  END OF SESSION:  PT End of Session - 02/27/23 0854     Visit Number 1    Number of Visits 9    Date for PT Re-Evaluation 03/27/23    Authorization Type BCBS   auth submitted   Authorization - Visit Number 11    Authorization - Number of Visits 30    PT Start Time 0804    PT Stop Time 0848    PT Time Calculation (min) 44 min    Activity Tolerance Patient tolerated treatment well    Behavior During Therapy [facility] for tasks assessed/performed             Past Medical History:  Diagnosis Date   Allergy    Anemia 10/24   Thought to be related to NSAID   Arthritis    Asthma    Ehlers-Danlos syndrome    GERD (gastroesophageal reflux disease)    History of pituitary tumor    Ocular migraine    Past Surgical History:  Procedure Laterality Date   KNEE SURGERY N/A 01/17/2010   SHOULDER SURGERY N/A 01/18/2007   SPINE SURGERY     C5-6 fusion   UTERINE FIBROID SURGERY N/A    Patient Active Problem List   Diagnosis Date Noted   Subluxation 04/07/2022   Right hip subluxation, sequela 02/15/2022   Asthma 11/11/2021   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   Finger pain, right 07/15/2021   Knee pain, left 04/25/2019   Bicipital tendonitis of left shoulder 04/25/2019   Varicose veins of bilateral lower extremities with other complications 10/25/2018   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   POTS (postural orthostatic tachycardia syndrome) 08/29/2017   Chronic pain syndrome 08/29/2017   Chronic insomnia 08/29/2017   Wrist pain, acute 06/02/2016   Enthesopathy of right shoulder 01/28/2016   Right shoulder pain 01/28/2016   Family history of early CAD 11/30/2015   Cervical disc disorder with radiculopathy of cervical region 05/13/2014   Chronic right SI joint pain 08/28/2013   Hip pain, right 06/25/2013   Elbow pain,  right 02/13/2013   Bunionette 01/07/2013   Ehlers-Danlos disease 11/08/2012   Unspecified visual disturbance 04/19/2012   Pituitary adenoma with extrasellar extension (HCC) 04/19/2012    PCP: Zilphia Hilt, Charyl Coppersmith, MD  REFERRING PROVIDER: Zilphia Hilt, Charyl Coppersmith, MD   REFERRING DIAG: [phone] (ICD-10-CM) - BPPV (benign paroxysmal positional vertigo), left  THERAPY DIAG:  BPPV (benign paroxysmal positional vertigo), left  Unsteadiness on feet  ONSET DATE: 4 weeks  Rationale for Evaluation and Treatment: Rehabilitation  SUBJECTIVE:   SUBJECTIVE STATEMENT: Patient reports dizziness started 4 weeks ago. Started when she got up from bed. Had a similar episode 15 years ago but this episode is different. Worse with lying back and turning to L side, getting out of bed, getting into the car. Reports nausea. Symptoms are fluctuating. Patient reports that she has a lot of instability in her neck d/t hx of EDS and is very cautious about moving her neck. Reports that she has neck spasms. Denies head trauma, infection/illness, vision changes/double vision, hearing loss. Reports a hx of ocular migraines. Reports some head fullness.    Pt accompanied by: self  PERTINENT HISTORY: Anemia, asthma, ehlers-danlos syndrome, hx pituitary tumor, ocular migraine, C4-5 fusion, POTS  PAIN:  Are you having pain? No  PRECAUTIONS: reports instability in R hip causing near-falls  and cervical instability; pt denies specific cervical precautions   RED FLAGS: None   WEIGHT BEARING RESTRICTIONS: No  FALLS: Has patient fallen in last 6 months? Yes. Number of falls a few near falls d/t R hip   LIVING ENVIRONMENT: Lives with: lives alone Lives in: House/apartment; townhouse Stairs:  2 story home; no steps to enter  Has following equipment at home: Tour manager; crutches  PLOF: Independent; hospice clinical educator- standing, desk work, some travel   PATIENT GOALS: improve dizziness    OBJECTIVE:  Note: Objective measures were completed at Evaluation unless otherwise noted.  DIAGNOSTIC FINDINGS: none recent; 08/31/22 cervical x-ray: Prior anterior fusion of C5 and C6 unchanged compared prior exam without malalignment.  COGNITION: Overall cognitive status: Within functional limits for tasks assessed   SENSATION: Denies UE/LE N/T  POSTURE:  rounded shoulders  GAIT: Gait pattern:  small steps, cautious Assistive device utilized: None Level of assistance: Modified independence   PATIENT SURVEYS:  DHI 30/100  VESTIBULAR ASSESSMENT:  GENERAL OBSERVATION: pt wears 1 distance contact in L eye    OCULOMOTOR EXAM:  Ocular Alignment: normal  Ocular ROM: No Limitations  Spontaneous Nystagmus: absent  Gaze-Induced Nystagmus: absent  Smooth Pursuits: intact  Saccades: intact  Convergence/Divergence:v isible L convergence insufficiency at ~ 5 inches    VESTIBULAR - OCULAR REFLEX:   Slow VOR: Normal; vertical and horizontal   VOR Cancellation: Normal; c/o L ear pressure   Head-Impulse Test: HIT Right: negative HIT Left: negative  *extra caution taken d/t pt's concerns about her neck    POSITIONAL TESTING:  *extra caution taken d/t pt's concerns about her neck  Right Roll Test: negative  Left Roll Test: negative   Right Sidelying: negative; c/o mild wooziness upon sitting Left Sidelying: negative; c/o more intense wooziness upon sitting up    Modified over pillow Left Dix-Hallpike: upbeating nystagmus lasting ~15 sec                                                                                                                               TREATMENT DATE: 02/27/23   Canalith Repositioning:  Modified with pillow Epley Left: Number of Reps: 1, Response to Treatment: comment: c/o dizziness in position 3 and upon sitting up; unable to retest d/t time, and Comment: tolerated well    PATIENT EDUCATION: Education details: prognosis, POC, edu on BPPV  and post-CRM expectations/symptoms, handout on BPPV Person educated: Patient Education method: Explanation, Demonstration, Tactile cues, Verbal cues, and Handouts Education comprehension: verbalized understanding  HOME EXERCISE PROGRAM:  GOALS: Goals reviewed with patient? Yes  SHORT TERM GOALS: Target date: 03/13/2023  Patient to be independent with initial HEP. Baseline: HEP initiated Goal status: INITIAL    LONG TERM GOALS: Target date: 03/27/2023  Patient to be independent with advanced HEP. Baseline: Not yet initiated  Goal status: INITIAL  Patient will report 0/10 dizziness with bed mobility.  Baseline: Symptomatic  Goal status: INITIAL  Patient to  score at least 20/24 on DGI in order to decrease risk of falls. Baseline: NT Goal status: INITIAL  Patient to score at least 18 points less on DHI (12 points) in order to meet MCID and improve functional outcomes.  Baseline: 30  Goal status: INITIAL   ASSESSMENT:  CLINICAL IMPRESSION:   Patient is a 58 y/o F presenting to OPPT with c/o dizziness for the past 4 weeks. Denies head trauma, infection/illness, vision changes/double vision, hearing loss. Reports a hx of ocular migraines. She reported some concerns about moving her head during exam d/t her report of hx of cervical instability. Extra caution taken during assessment to maintain safety and comfort. Patient today presented with L convergence insufficiency, positive L DH. Patient was treated with L Epley with modification to prevent neck pain. She tolerated this well; c/o some dizziness after treatment as expected. Patient was escorted to waiting room for safety.  Would benefit from skilled PT services 1-2x/week for 4 weeks to address aforementioned impairments in order to optimize level of function.    OBJECTIVE IMPAIRMENTS: Abnormal gait, decreased balance, and dizziness.   ACTIVITY LIMITATIONS: carrying, lifting, bending, sitting, standing, squatting, sleeping,  stairs, transfers, bed mobility, bathing, toileting, dressing, reach over head, and hygiene/grooming  PARTICIPATION LIMITATIONS: meal prep, cleaning, laundry, driving, shopping, community activity, and occupation  PERSONAL FACTORS: Age, Past/current experiences, Time since onset of injury/illness/exacerbation, and 3+ comorbidities: Anemia, asthma, ehlers-danlos syndrome, hx pituitary tumor, ocular migraine, C4-5 fusion, POTS  are also affecting patient's functional outcome.   REHAB POTENTIAL: Good  CLINICAL DECISION MAKING: Evolving/moderate complexity  EVALUATION COMPLEXITY: Moderate   PLAN:  PT FREQUENCY: 1-2x/week  PT DURATION: 4 weeks  PLANNED INTERVENTIONS: 97164- PT Re-evaluation, 97110-Therapeutic exercises, 97530- Therapeutic activity, [id number]- Neuromuscular re-education, 97535- Self Care, 44034- Manual therapy, [id number]- Gait training, [phone]- Canalith repositioning, [phone]- Aquatic Therapy, Patient/Family education, Balance training, Stair training, Taping, Dry Needling, Vestibular training, Cryotherapy, and Moist heat  PLAN FOR NEXT SESSION: DGI, retest L modified DH     For all possible CPT codes, reference the Planned Interventions line above.     Check all conditions that are expected to impact treatment: {Conditions expected to impact treatment:Musculoskeletal disorders, Structural or anatomic abnormalities, and Neurological condition and/or seizures   If treatment provided at initial evaluation, no treatment charged due to lack of authorization.     Thaddeus Filippo, PT, DPT 02/27/23 9:52 AM  [facility] [address] [place], [address] [place], [place] [address] Phone # [phone] Fax # [phone]

## 2023-02-24 ENCOUNTER — Encounter: Payer: BC Managed Care – PPO | Admitting: Physical Medicine & Rehabilitation

## 2023-02-26 NOTE — Unmapped (Signed)
[facility] Pharm[facility] Refill Coordination Note    Natasha Copeland, DOB: 1965-10-25  Phone: [phone] (home)       All above HIPAA information was verified with patient.         02/26/2023    11:14 AM   Specialty Rx Medication Refill Questionnaire   Which Medications would you like refilled and shipped? Xolair, I have none on hand   Please list all current allergies: Doxipen   Have you missed any doses in the last 30 days? No   Have you had any changes to your medication(s) since your last refill? No   How many days remaining of each medication do you have at home? 0   If receiving an injectable medication, next injection date is 03/13/2023   Have you experienced any side effects in the last 30 days? No   Please enter the full address (street address, city, state, zip code) where you would like your medication(s) to be delivered to. [address], [place], [place] [address]   Please specify on which day you would like your medication(s) to arrive. Note: if you need your medication(s) within 3 days, please call the pharmacy to schedule your order at [phone]  03/09/2023   Has your insurance changed since your last refill? No   Would you like a pharmacist to call you to discuss your medication(s)? No   Do you require a signature for your package? (Note: if we are billing Medicare Part B or your order contains a controlled substance, we will require a signature) No         Completed refill call assessment today to schedule patient's medication shipment from the [facility] [phone]).  All relevant notes have been reviewed.       Confirmed patient received a Conservation officer, historic buildings and a Surveyor, mining with first shipment. The patient will receive a drug information handout for each medication shipped and additional FDA Medication Guides as required.         REFERRAL TO PHARMACIST     Referral to the pharmacist: Not needed      [facility]     Shipping address confirmed in Epic.     Delivery Scheduled: Yes, Expected medication delivery date: 03/09/23.     Medication will be delivered via UPS to the prescription address in Epic WAM.    Ricci Barker   [facility] Specialty Technician

## 2023-02-27 ENCOUNTER — Encounter: Payer: Self-pay | Admitting: Physical Therapy

## 2023-02-27 ENCOUNTER — Other Ambulatory Visit: Payer: Self-pay

## 2023-02-27 ENCOUNTER — Ambulatory Visit: Payer: BC Managed Care – PPO | Attending: Sports Medicine | Admitting: Physical Therapy

## 2023-02-27 DIAGNOSIS — H8112 Benign paroxysmal vertigo, left ear: Secondary | ICD-10-CM | POA: Diagnosis not present

## 2023-02-27 DIAGNOSIS — R2681 Unsteadiness on feet: Secondary | ICD-10-CM | POA: Insufficient documentation

## 2023-03-02 ENCOUNTER — Ambulatory Visit: Payer: BC Managed Care – PPO

## 2023-03-02 DIAGNOSIS — H8112 Benign paroxysmal vertigo, left ear: Secondary | ICD-10-CM | POA: Diagnosis not present

## 2023-03-02 DIAGNOSIS — R2681 Unsteadiness on feet: Secondary | ICD-10-CM

## 2023-03-02 NOTE — Therapy (Signed)
OUTPATIENT PHYSICAL THERAPY VESTIBULAR TREATMENT     Patient Name: Marissa [name] MRN: [id number] DOB:[birth year], 58 y.o., female Today's Date: 03/02/2023  END OF SESSION:  PT End of Session - 03/02/23 0848     Visit Number 2    Number of Visits 9    Date for PT Re-Evaluation 03/27/23    Authorization Type BCBS   auth submitted   Authorization - Visit Number 12    Authorization - Number of Visits 30    PT Start Time 0845    PT Stop Time 0930    PT Time Calculation (min) 45 min    Activity Tolerance Patient tolerated treatment well    Behavior During Therapy [facility] for tasks assessed/performed             Past Medical History:  Diagnosis Date   Allergy    Anemia 10/24   Thought to be related to NSAID   Arthritis    Asthma    Ehlers-Danlos syndrome    GERD (gastroesophageal reflux disease)    History of pituitary tumor    Ocular migraine    Past Surgical History:  Procedure Laterality Date   KNEE SURGERY N/A 01/17/2010   SHOULDER SURGERY N/A 01/18/2007   SPINE SURGERY     C5-6 fusion   UTERINE FIBROID SURGERY N/A    Patient Active Problem List   Diagnosis Date Noted   Subluxation 04/07/2022   Right hip subluxation, sequela 02/15/2022   Asthma 11/11/2021   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   Finger pain, right 07/15/2021   Knee pain, left 04/25/2019   Bicipital tendonitis of left shoulder 04/25/2019   Varicose veins of bilateral lower extremities with other complications 10/25/2018   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   POTS (postural orthostatic tachycardia syndrome) 08/29/2017   Chronic pain syndrome 08/29/2017   Chronic insomnia 08/29/2017   Wrist pain, acute 06/02/2016   Enthesopathy of right shoulder 01/28/2016   Right shoulder pain 01/28/2016   Family history of early CAD 11/30/2015   Cervical disc disorder with radiculopathy of cervical region 05/13/2014   Chronic right SI joint pain 08/28/2013   Hip pain, right 06/25/2013   Elbow pain,  right 02/13/2013   Bunionette 01/07/2013   Ehlers-Danlos disease 11/08/2012   Unspecified visual disturbance 04/19/2012   Pituitary adenoma with extrasellar extension (HCC) 04/19/2012    PCP: Philip Aspen, Limmie Patricia, MD  REFERRING PROVIDER: Philip Aspen, Limmie Patricia, MD   REFERRING DIAG: [phone] (ICD-10-CM) - BPPV (benign paroxysmal positional vertigo), left  THERAPY DIAG:  BPPV (benign paroxysmal positional vertigo), left  Unsteadiness on feet  ONSET DATE: 4 weeks  Rationale for Evaluation and Treatment: Rehabilitation  SUBJECTIVE:   SUBJECTIVE STATEMENT: Some improvement in sensation, less intense but lasting longer.    Pt accompanied by: self  PERTINENT HISTORY: Anemia, asthma, ehlers-danlos syndrome, hx pituitary tumor, ocular migraine, C4-5 fusion, POTS  PAIN:  Are you having pain? No  PRECAUTIONS: reports instability in R hip causing near-falls and cervical instability; pt denies specific cervical precautions   RED FLAGS: None   WEIGHT BEARING RESTRICTIONS: No  FALLS: Has patient fallen in last 6 months? Yes. Number of falls a few near falls d/t R hip   LIVING ENVIRONMENT: Lives with: lives alone Lives in: House/apartment; townhouse Stairs:  2 story home; no steps to enter  Has following equipment at home: Tour manager; crutches  PLOF: Independent; hospice clinical educator- standing, desk work, some travel   PATIENT GOALS: improve dizziness  OBJECTIVE:   TODAY'S TREATMENT: 03/02/23 Activity Comments  Left Dix-Hallpike -left upbeating nystagmus x 8 sec after brief latency  Left Epley   Loaded Dix-Hallpike left Moved slowly to position, no nystagmus/vertigo. Not as symptomatic with arising  Left Dix-Hallpike W/ faster speed, no vertigo, no nystagmus, minimal issue with arising--unsure if the feeling is POTS or BPPV, no nystagmus observed  DGI 21/24        [facility] PT Assessment - 03/02/23 0001       Standardized Balance Assessment    Standardized Balance Assessment Dynamic Gait Index      Dynamic Gait Index   Level Surface Normal    Change in Gait Speed Normal    Gait with Horizontal Head Turns Moderate Impairment    Gait with Vertical Head Turns Mild Impairment    Gait and Pivot Turn Normal    Step Over Obstacle Normal    Step Around Obstacles Normal    Steps Normal    Total Score 21             Note: Objective measures were completed at Evaluation unless otherwise noted.  DIAGNOSTIC FINDINGS: none recent; 08/31/22 cervical x-ray: Prior anterior fusion of C5 and C6 unchanged compared prior exam without malalignment.  COGNITION: Overall cognitive status: Within functional limits for tasks assessed   SENSATION: Denies UE/LE N/T  POSTURE:  rounded shoulders  GAIT: Gait pattern:  small steps, cautious Assistive device utilized: None Level of assistance: Modified independence   PATIENT SURVEYS:  DHI 30/100  VESTIBULAR ASSESSMENT:  GENERAL OBSERVATION: pt wears 1 distance contact in L eye    OCULOMOTOR EXAM:  Ocular Alignment: normal  Ocular ROM: No Limitations  Spontaneous Nystagmus: absent  Gaze-Induced Nystagmus: absent  Smooth Pursuits: intact  Saccades: intact  Convergence/Divergence:v isible L convergence insufficiency at ~ 5 inches    VESTIBULAR - OCULAR REFLEX:   Slow VOR: Normal; vertical and horizontal   VOR Cancellation: Normal; c/o L ear pressure   Head-Impulse Test: HIT Right: negative HIT Left: negative  *extra caution taken d/t pt's concerns about her neck    POSITIONAL TESTING:  *extra caution taken d/t pt's concerns about her neck  Right Roll Test: negative  Left Roll Test: negative   Right Sidelying: negative; c/o mild wooziness upon sitting Left Sidelying: negative; c/o more intense wooziness upon sitting up    Modified over pillow Left Dix-Hallpike: upbeating nystagmus lasting ~15 sec                                                                                                                                TREATMENT DATE: 02/27/23   Canalith Repositioning:  Modified with pillow Epley Left: Number of Reps: 1, Response to Treatment: comment: c/o dizziness in position 3 and upon sitting up; unable to retest d/t time, and Comment: tolerated well    PATIENT EDUCATION: Education details: prognosis, POC, edu on BPPV and post-CRM expectations/symptoms, handout on BPPV Person  educated: Patient Education method: Explanation, Demonstration, Tactile cues, Verbal cues, and Handouts Education comprehension: verbalized understanding  HOME EXERCISE PROGRAM: Access Code: [email or web address] URL: [email or web address] Date: 03/02/2023 Prepared by: Shary Decamp  Exercises - Self-Epley Maneuver Left Ear  - 1 x daily - 7 x weekly GOALS: Goals reviewed with patient? Yes  SHORT TERM GOALS: Target date: 03/13/2023  Patient to be independent with initial HEP. Baseline: HEP initiated Goal status: INITIAL    LONG TERM GOALS: Target date: 03/27/2023  Patient to be independent with advanced HEP. Baseline: Not yet initiated  Goal status: INITIAL  Patient will report 0/10 dizziness with bed mobility.  Baseline: Symptomatic  Goal status: INITIAL  Patient to score at least 20/24 on DGI in order to decrease risk of falls. Baseline: NT Goal status: INITIAL  Patient to score at least 18 points less on DHI (12 points) in order to meet MCID and improve functional outcomes.  Baseline: 30  Goal status: INITIAL   ASSESSMENT:  CLINICAL IMPRESSION:  Returns to clinic with report of ongoing vertigo symptoms but not as intense as previous.  Initial left Dix-Hallpike with left upbeating nystagmus x 8 sec observed, proceeded with left Epley maneuver.  Repeat Dix-Hallpike without provocation but one instance of arising and feeling symptomatic and nystagmus, but absent to repeat trials and this may be attributed to POTS.  Dynamic Gait Index with overall good  performance other than horizontal head turns causing dirsuption to balance.  Pt instructed in left self-Epley with good return demonstration and understanding of positioning and timing and provided instruction for self-Epley for attempts at home if symptoms arise, verbalized understanding  OBJECTIVE IMPAIRMENTS: Abnormal gait, decreased balance, and dizziness.   ACTIVITY LIMITATIONS: carrying, lifting, bending, sitting, standing, squatting, sleeping, stairs, transfers, bed mobility, bathing, toileting, dressing, reach over head, and hygiene/grooming  PARTICIPATION LIMITATIONS: meal prep, cleaning, laundry, driving, shopping, community activity, and occupation  PERSONAL FACTORS: Age, Past/current experiences, Time since onset of injury/illness/exacerbation, and 3+ comorbidities: Anemia, asthma, ehlers-danlos syndrome, hx pituitary tumor, ocular migraine, C4-5 fusion, POTS  are also affecting patient's functional outcome.   REHAB POTENTIAL: Good  CLINICAL DECISION MAKING: Evolving/moderate complexity  EVALUATION COMPLEXITY: Moderate   PLAN:  PT FREQUENCY: 1-2x/week  PT DURATION: 4 weeks  PLANNED INTERVENTIONS: 97164- PT Re-evaluation, 97110-Therapeutic exercises, 97530- Therapeutic activity, [id number]- Neuromuscular re-education, 97535- Self Care, 16109- Manual therapy, [id number]- Gait training, [phone]- Canalith repositioning, [phone]- Aquatic Therapy, Patient/Family education, Balance training, Stair training, Taping, Dry Needling, Vestibular training, Cryotherapy, and Moist heat  PLAN FOR NEXT SESSION: , retest L modified DH     9:35 AM, 03/02/23 M. Shary Decamp, PT, DPT Physical Therapist- [place] Office Number: [phone]

## 2023-03-02 NOTE — Therapy (Signed)
OUTPATIENT PHYSICAL THERAPY VESTIBULAR TREATMENT     Patient Name: Marissa [name] MRN: [id number] DOB:29-Sep-1965, 58 y.o., female Today's Date: 03/06/2023  END OF SESSION:  PT End of Session - 03/06/23 0823     Visit Number 3    Number of Visits 9    Date for PT Re-Evaluation 03/27/23    Authorization Type BCBS   auth submitted   Authorization Time Period approved 5 PT visits from 02/27/2023 - 04/27/2023    Authorization - Visit Number 3    Authorization - Number of Visits 5    PT Start Time 0800    PT Stop Time 0823    PT Time Calculation (min) 23 min    Activity Tolerance Patient tolerated treatment well    Behavior During Therapy [facility] for tasks assessed/performed              Past Medical History:  Diagnosis Date   Allergy    Anemia 10/24   Thought to be related to NSAID   Arthritis    Asthma    Ehlers-Danlos syndrome    GERD (gastroesophageal reflux disease)    History of pituitary tumor    Ocular migraine    Past Surgical History:  Procedure Laterality Date   KNEE SURGERY N/A 01/17/2010   SHOULDER SURGERY N/A 01/18/2007   SPINE SURGERY     C5-6 fusion   UTERINE FIBROID SURGERY N/A    Patient Active Problem List   Diagnosis Date Noted   Subluxation 04/07/2022   Right hip subluxation, sequela 02/15/2022   Asthma 11/11/2021   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   Finger pain, right 07/15/2021   Knee pain, left 04/25/2019   Bicipital tendonitis of left shoulder 04/25/2019   Varicose veins of bilateral lower extremities with other complications 10/25/2018   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   POTS (postural orthostatic tachycardia syndrome) 08/29/2017   Chronic pain syndrome 08/29/2017   Chronic insomnia 08/29/2017   Wrist pain, acute 06/02/2016   Enthesopathy of right shoulder 01/28/2016   Right shoulder pain 01/28/2016   Family history of early CAD 11/30/2015   Cervical disc disorder with radiculopathy of cervical region 05/13/2014   Chronic  right SI joint pain 08/28/2013   Hip pain, right 06/25/2013   Elbow pain, right 02/13/2013   Bunionette 01/07/2013   Ehlers-Danlos disease 11/08/2012   Unspecified visual disturbance 04/19/2012   Pituitary adenoma with extrasellar extension (HCC) 04/19/2012    PCP: Philip Aspen, Limmie Patricia, MD  REFERRING PROVIDER: Philip Aspen, Limmie Patricia, MD   REFERRING DIAG: [phone] (ICD-10-CM) - BPPV (benign paroxysmal positional vertigo), left  THERAPY DIAG:  BPPV (benign paroxysmal positional vertigo), left  Unsteadiness on feet  ONSET DATE: 4 weeks  Rationale for Evaluation and Treatment: Rehabilitation  SUBJECTIVE:   SUBJECTIVE STATEMENT: Reports that after last session she had episodes of dizziness getting out of the car. That night she did the repositioning treatment for her L ear and has not had an episode since.    Pt accompanied by: self  PERTINENT HISTORY: Anemia, asthma, ehlers-danlos syndrome, hx pituitary tumor, ocular migraine, C4-5 fusion, POTS  PAIN:  Are you having pain? No  PRECAUTIONS: reports instability in R hip causing near-falls and cervical instability; pt denies specific cervical precautions   RED FLAGS: None   WEIGHT BEARING RESTRICTIONS: No  FALLS: Has patient fallen in last 6 months? Yes. Number of falls a few near falls d/t R hip   LIVING ENVIRONMENT: Lives with: lives alone Lives in:  House/apartment; townhouse Stairs:  2 story home; no steps to enter  Has following equipment at home: Shower bench; crutches  PLOF: Independent; hospice clinical educator- standing, desk work, some travel   PATIENT GOALS: improve dizziness   OBJECTIVE:      TODAY'S TREATMENT: 03/06/23 Activity Comments  L modified DH over pillow negative  R modified DH over pillow Negative   R sidelying test negative  L sidelying test Negative   Standing D2 flexion to cone on floor  No dizziness   Standing head nods 30" No dizziness              PATIENT  EDUCATION: Education details: discussion on possible 30 day hold if asymptomatic at next session, edu on vestibular anatomy  Person educated: Patient Education method: Explanation Education comprehension: verbalized understanding     Note: Objective measures were completed at Evaluation unless otherwise noted.  DIAGNOSTIC FINDINGS: none recent; 08/31/22 cervical x-ray: Prior anterior fusion of C5 and C6 unchanged compared prior exam without malalignment.  COGNITION: Overall cognitive status: Within functional limits for tasks assessed   SENSATION: Denies UE/LE N/T  POSTURE:  rounded shoulders  GAIT: Gait pattern:  small steps, cautious Assistive device utilized: None Level of assistance: Modified independence   PATIENT SURVEYS:  DHI 30/100  VESTIBULAR ASSESSMENT:  GENERAL OBSERVATION: pt wears 1 distance contact in L eye    OCULOMOTOR EXAM:  Ocular Alignment: normal  Ocular ROM: No Limitations  Spontaneous Nystagmus: absent  Gaze-Induced Nystagmus: absent  Smooth Pursuits: intact  Saccades: intact  Convergence/Divergence:v isible L convergence insufficiency at ~ 5 inches    VESTIBULAR - OCULAR REFLEX:   Slow VOR: Normal; vertical and horizontal   VOR Cancellation: Normal; c/o L ear pressure   Head-Impulse Test: HIT Right: negative HIT Left: negative  *extra caution taken d/t pt's concerns about her neck    POSITIONAL TESTING:  *extra caution taken d/t pt's concerns about her neck  Right Roll Test: negative  Left Roll Test: negative   Right Sidelying: negative; c/o mild wooziness upon sitting Left Sidelying: negative; c/o more intense wooziness upon sitting up    Modified over pillow Left Dix-Hallpike: upbeating nystagmus lasting ~15 sec                                                                                                                               TREATMENT DATE: 02/27/23   Canalith Repositioning:  Modified with pillow Epley Left:  Number of Reps: 1, Response to Treatment: comment: c/o dizziness in position 3 and upon sitting up; unable to retest d/t time, and Comment: tolerated well    PATIENT EDUCATION: Education details: prognosis, POC, edu on BPPV and post-CRM expectations/symptoms, handout on BPPV Person educated: Patient Education method: Explanation, Demonstration, Tactile cues, Verbal cues, and Handouts Education comprehension: verbalized understanding  HOME EXERCISE PROGRAM: Access Code: [email or web address] URL: [email or web address] Date: 03/02/2023 Prepared by: Shary Decamp  Exercises - Self-Epley Maneuver Left Ear  -  1 x daily - 7 x weekly GOALS: Goals reviewed with patient? Yes  SHORT TERM GOALS: Target date: 03/13/2023  Patient to be independent with initial HEP. Baseline: HEP initiated Goal status: IN PROGRESS    LONG TERM GOALS: Target date: 03/27/2023  Patient to be independent with advanced HEP. Baseline: Not yet initiated  Goal status: IN PROGRESS  Patient will report 0/10 dizziness with bed mobility.  Baseline: Symptomatic  Goal status: IN PROGRESS  Patient to score at least 20/24 on DGI in order to decrease risk of falls. Baseline: NT Goal status: IN PROGRESS  Patient to score at least 18 points less on DHI (12 points) in order to meet MCID and improve functional outcomes.  Baseline: 30  Goal status: IN PROGRESS   ASSESSMENT:  CLINICAL IMPRESSION:  Patient arrived to session with report of some dizziness after last session but after performing repositioning on herself has not has symptoms since. Positional testing was negative. Worked through additional habituation movements to assess tolerance for bending and looking overhead- patient was again asymptomatic. Plan for possible 30 day hold next session if dizziness continues to be resolved. Patient in agreement.   OBJECTIVE IMPAIRMENTS: Abnormal gait, decreased balance, and dizziness.   ACTIVITY LIMITATIONS: carrying,  lifting, bending, sitting, standing, squatting, sleeping, stairs, transfers, bed mobility, bathing, toileting, dressing, reach over head, and hygiene/grooming  PARTICIPATION LIMITATIONS: meal prep, cleaning, laundry, driving, shopping, community activity, and occupation  PERSONAL FACTORS: Age, Past/current experiences, Time since onset of injury/illness/exacerbation, and 3+ comorbidities: Anemia, asthma, ehlers-danlos syndrome, hx pituitary tumor, ocular migraine, C4-5 fusion, POTS  are also affecting patient's functional outcome.   REHAB POTENTIAL: Good  CLINICAL DECISION MAKING: Evolving/moderate complexity  EVALUATION COMPLEXITY: Moderate   PLAN:  PT FREQUENCY: 1-2x/week  PT DURATION: 4 weeks  PLANNED INTERVENTIONS: 97164- PT Re-evaluation, 97110-Therapeutic exercises, 97530- Therapeutic activity, 97112- Neuromuscular re-education, 97535- Self Care, 16109- Manual therapy, [phone]- Gait training, [phone]- Canalith repositioning, [phone]- Aquatic Therapy, Patient/Family education, Balance training, Stair training, Taping, Dry Needling, Vestibular training, Cryotherapy, and Moist heat  PLAN FOR NEXT SESSION: , retest L modified DH   Baldemar Friday, PT, DPT 03/06/23 8:26 AM  [facility] [address], [address] [place], [place] [address] Phone # [phone] Fax # [phone]

## 2023-03-02 NOTE — Telephone Encounter (Signed)
I Just called the patient insurance company [phone] extension [phone]) I was transferred to the pharmacist Dr. Zetta Bills.  I did explain the clinical situation that the patient currently is experiencing significant symptoms that is likely due to postural orthostatic tachycardia syndrome and has not been able to tolerate propranolol in the past due to low systolic blood pressure.  I explained that the symptoms are worsening and the patient would highly benefit from the use of ivabradine.  This has been shown in a randomized trial which was done and published in 2021 the effects that ivabradine is invasive and effective in significantly improving heart rate in individuals with POTS.  He tells me that he will call me back after he speaks with his team.  Will await the phone call.

## 2023-03-06 ENCOUNTER — Ambulatory Visit: Payer: BC Managed Care – PPO | Admitting: Physical Therapy

## 2023-03-06 ENCOUNTER — Encounter: Payer: Self-pay | Admitting: Physical Therapy

## 2023-03-06 DIAGNOSIS — H8112 Benign paroxysmal vertigo, left ear: Secondary | ICD-10-CM

## 2023-03-06 DIAGNOSIS — R2681 Unsteadiness on feet: Secondary | ICD-10-CM

## 2023-03-07 ENCOUNTER — Ambulatory Visit (INDEPENDENT_AMBULATORY_CARE_PROVIDER_SITE_OTHER): Payer: BC Managed Care – PPO | Admitting: Sports Medicine

## 2023-03-07 DIAGNOSIS — M5010 Cervical disc disorder with radiculopathy, unspecified cervical region: Secondary | ICD-10-CM

## 2023-03-07 MED ORDER — ZOLPIDEM TARTRATE 10 MG PO TABS: ORAL_TABLET | ORAL | 3 refills | Status: AC

## 2023-03-07 NOTE — Progress Notes (Signed)
Chief complaint follow-up of vertigo and muscle spasm left trapezius and right SI joint  Since her last visit with me the patient has had benign positional vertigo.  Just in the last 48 hours she has seen her response to the vestibular physical therapy and is not having vertigo today.  However she does have some spasm over the left trapezius that is developed since the vertigo started. We are following her up for SI joint pain and instability related to her Ehlers-Danlos. That has improved some but primarily because she has been resting.  The exercises I gave her did seem to help.  Physical examination  pleasant female in no acute distress  There is moderate spasm over the left trapezius Right SI joint still shows hypermobility but is not as tender as before Walking without a limp Examination of the neck shows increased range of motion as before but this is not triggering vertigo today  Trial of ESWT For her left trapezius I gave her Power levels 60 mJ Head size large 1000 impulses Target left trapezius muscle Frequency 10  At conclusion she had increased range of motion and less tenderness

## 2023-03-07 NOTE — Assessment & Plan Note (Signed)
Currently with some numbness after doing exercises for benign positional vertigo Given a series of easy neck range of motion exercises to lessen her numbness Trial of shockwave for her trapezius Recheck in 4 weeks

## 2023-03-09 NOTE — Unmapped (Signed)
 Natasha Copeland 's Xolair shipment will be delayed as a result of inclement weather.     I have spoken with the patient  at [phone]  and communicated the delivery change. We will reschedule the medication for the delivery date that the patient agreed upon.  We have confirmed the delivery date as 03/14/23, via ups.

## 2023-03-09 NOTE — Therapy (Signed)
OUTPATIENT PHYSICAL THERAPY VESTIBULAR TREATMENT     Patient Name: Marissa [name] MRN: [id number] DOB:06-15-1965, 58 y.o., female Today's Date: 03/10/2023  END OF SESSION:  PT End of Session - 03/10/23 0826     Visit Number 4    Number of Visits 9    Date for PT Re-Evaluation 03/27/23    Authorization Type BCBS   auth submitted   Authorization Time Period approved 5 PT visits from 02/27/2023 - 04/27/2023    Authorization - Visit Number 4    Authorization - Number of Visits 5    PT Start Time 0802    PT Stop Time 0825    PT Time Calculation (min) 23 min    Activity Tolerance Patient tolerated treatment well    Behavior During Therapy [facility] for tasks assessed/performed               Past Medical History:  Diagnosis Date   Allergy    Anemia 10/24   Thought to be related to NSAID   Arthritis    Asthma    Ehlers-Danlos syndrome    GERD (gastroesophageal reflux disease)    History of pituitary tumor    Ocular migraine    Past Surgical History:  Procedure Laterality Date   KNEE SURGERY N/A 01/17/2010   SHOULDER SURGERY N/A 01/18/2007   SPINE SURGERY     C5-6 fusion   UTERINE FIBROID SURGERY N/A    Patient Active Problem List   Diagnosis Date Noted   Subluxation 04/07/2022   Right hip subluxation, sequela 02/15/2022   Asthma 11/11/2021   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   Finger pain, right 07/15/2021   Knee pain, left 04/25/2019   Bicipital tendonitis of left shoulder 04/25/2019   Varicose veins of bilateral lower extremities with other complications 10/25/2018   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   POTS (postural orthostatic tachycardia syndrome) 08/29/2017   Chronic pain syndrome 08/29/2017   Chronic insomnia 08/29/2017   Wrist pain, acute 06/02/2016   Enthesopathy of right shoulder 01/28/2016   Right shoulder pain 01/28/2016   Family history of early CAD 11/30/2015   Cervical disc disorder with radiculopathy of cervical region 05/13/2014   Chronic  right SI joint pain 08/28/2013   Hip pain, right 06/25/2013   Elbow pain, right 02/13/2013   Bunionette 01/07/2013   Ehlers-Danlos disease 11/08/2012   Unspecified visual disturbance 04/19/2012   Pituitary adenoma with extrasellar extension (HCC) 04/19/2012    PCP: Philip Aspen, Limmie Patricia, MD  REFERRING PROVIDER: Philip Aspen, Limmie Patricia, MD   REFERRING DIAG: [phone] (ICD-10-CM) - BPPV (benign paroxysmal positional vertigo), left  THERAPY DIAG:  BPPV (benign paroxysmal positional vertigo), left  ONSET DATE: 4 weeks  Rationale for Evaluation and Treatment: Rehabilitation  SUBJECTIVE:   SUBJECTIVE STATEMENT: Has been able to lie down and use her foam roller since last session without dizziness.   Pt accompanied by: self  PERTINENT HISTORY: Anemia, asthma, ehlers-danlos syndrome, hx pituitary tumor, ocular migraine, C4-5 fusion, POTS  PAIN:  Are you having pain? No  PRECAUTIONS: reports instability in R hip causing near-falls and cervical instability; pt denies specific cervical precautions   RED FLAGS: None   WEIGHT BEARING RESTRICTIONS: No  FALLS: Has patient fallen in last 6 months? Yes. Number of falls a few near falls d/t R hip   LIVING ENVIRONMENT: Lives with: lives alone Lives in: House/apartment; townhouse Stairs:  2 story home; no steps to enter  Has following equipment at home: Shower bench; crutches  PLOF: Independent; hospice clinical educator- standing, desk work, some travel   PATIENT GOALS: improve dizziness   OBJECTIVE:    TODAY'S TREATMENT: 03/10/23 Activity Comments  L modified DH over pillow Negative   DGI 24/24  DHI 0               PATIENT EDUCATION: Education details: edu on progress towards goals, 30 day hold; edu on staying active to maintain healthy vestibular system, discussed pt's concerns on her chronic neck instability and encouraged PT management in the future if she is agreeable  Person educated: Patient Education  method: Explanation, Demonstration, Tactile cues, Verbal cues, and Handouts Education comprehension: verbalized understanding and returned demonstration       Note: Objective measures were completed at Evaluation unless otherwise noted.  DIAGNOSTIC FINDINGS: none recent; 08/31/22 cervical x-ray: Prior anterior fusion of C5 and C6 unchanged compared prior exam without malalignment.  COGNITION: Overall cognitive status: Within functional limits for tasks assessed   SENSATION: Denies UE/LE N/T  POSTURE:  rounded shoulders  GAIT: Gait pattern:  small steps, cautious Assistive device utilized: None Level of assistance: Modified independence   PATIENT SURVEYS:  DHI 30/100  VESTIBULAR ASSESSMENT:  GENERAL OBSERVATION: pt wears 1 distance contact in L eye    OCULOMOTOR EXAM:  Ocular Alignment: normal  Ocular ROM: No Limitations  Spontaneous Nystagmus: absent  Gaze-Induced Nystagmus: absent  Smooth Pursuits: intact  Saccades: intact  Convergence/Divergence:v isible L convergence insufficiency at ~ 5 inches    VESTIBULAR - OCULAR REFLEX:   Slow VOR: Normal; vertical and horizontal   VOR Cancellation: Normal; c/o L ear pressure   Head-Impulse Test: HIT Right: negative HIT Left: negative  *extra caution taken d/t pt's concerns about her neck    POSITIONAL TESTING:  *extra caution taken d/t pt's concerns about her neck  Right Roll Test: negative  Left Roll Test: negative   Right Sidelying: negative; c/o mild wooziness upon sitting Left Sidelying: negative; c/o more intense wooziness upon sitting up    Modified over pillow Left Dix-Hallpike: upbeating nystagmus lasting ~15 sec                                                                                                                               TREATMENT DATE: 02/27/23   Canalith Repositioning:  Modified with pillow Epley Left: Number of Reps: 1, Response to Treatment: comment: c/o dizziness in position 3  and upon sitting up; unable to retest d/t time, and Comment: tolerated well    PATIENT EDUCATION: Education details: prognosis, POC, edu on BPPV and post-CRM expectations/symptoms, handout on BPPV Person educated: Patient Education method: Explanation, Demonstration, Tactile cues, Verbal cues, and Handouts Education comprehension: verbalized understanding  HOME EXERCISE PROGRAM: Access Code: [email or web address] URL: [email or web address] Date: 03/02/2023 Prepared by: Shary Decamp  Exercises - Self-Epley Maneuver Left Ear  - 1 x daily - 7 x weekly GOALS: Goals reviewed with patient? Yes  SHORT TERM GOALS: Target date: 03/13/2023  Patient to be independent with initial HEP. Baseline: HEP initiated Goal status: MET    LONG TERM GOALS: Target date: 03/27/2023  Patient to be independent with advanced HEP. Baseline: Not yet initiated  Goal status: MET 03/10/23  Patient will report 0/10 dizziness with bed mobility.  Baseline: Symptomatic; reports asymptomatic at home in the last week 03/10/23 Goal status: MET 03/10/23  Patient to score at least 20/24 on DGI in order to decrease risk of falls. Baseline: 21>24  03/10/23 Goal status: MET 03/10/23  Patient to score at least 18 points less on DHI (12 points) in order to meet MCID and improve functional outcomes.  Baseline: 30 > 0 03/10/23 Goal status: MET 03/10/23   ASSESSMENT:  CLINICAL IMPRESSION: Patient arrived to session with report of being able to return to normal activities without dizziness at home. Positional re-test was negative. Patient demonstrated improved stability with balance testing, now meeting DGI goal. DHI score has now reduced to 0. Discussed 30 day hold in case symptoms return- patient is agreeable with this plan.   OBJECTIVE IMPAIRMENTS: Abnormal gait, decreased balance, and dizziness.   ACTIVITY LIMITATIONS: carrying, lifting, bending, sitting, standing, squatting, sleeping, stairs, transfers, bed  mobility, bathing, toileting, dressing, reach over head, and hygiene/grooming  PARTICIPATION LIMITATIONS: meal prep, cleaning, laundry, driving, shopping, community activity, and occupation  PERSONAL FACTORS: Age, Past/current experiences, Time since onset of injury/illness/exacerbation, and 3+ comorbidities: Anemia, asthma, ehlers-danlos syndrome, hx pituitary tumor, ocular migraine, C4-5 fusion, POTS  are also affecting patient's functional outcome.   REHAB POTENTIAL: Good  CLINICAL DECISION MAKING: Evolving/moderate complexity  EVALUATION COMPLEXITY: Moderate   PLAN:  PT FREQUENCY: 1-2x/week  PT DURATION: 4 weeks  PLANNED INTERVENTIONS: 97164- PT Re-evaluation, 97110-Therapeutic exercises, 97530- Therapeutic activity, 97112- Neuromuscular re-education, 97535- Self Care, 40981- Manual therapy, [phone]- Gait training, [phone]- Canalith repositioning, [phone]- Aquatic Therapy, Patient/Family education, Balance training, Stair training, Taping, Dry Needling, Vestibular training, Cryotherapy, and Moist heat  PLAN FOR NEXT SESSION: 30 day hold at this time     Baldemar Friday, PT, DPT 03/10/23 8:30 AM  [facility] at [facility] [address] [place], [place] [address] Phone # [phone] Fax # [phone]

## 2023-03-10 ENCOUNTER — Ambulatory Visit: Payer: BC Managed Care – PPO | Admitting: Physical Therapy

## 2023-03-10 ENCOUNTER — Encounter: Payer: Self-pay | Admitting: Physical Therapy

## 2023-03-10 DIAGNOSIS — H8112 Benign paroxysmal vertigo, left ear: Secondary | ICD-10-CM

## 2023-03-10 DIAGNOSIS — R2681 Unsteadiness on feet: Secondary | ICD-10-CM | POA: Diagnosis not present

## 2023-03-13 DIAGNOSIS — H2513 Age-related nuclear cataract, bilateral: Secondary | ICD-10-CM | POA: Diagnosis not present

## 2023-03-13 DIAGNOSIS — H00015 Hordeolum externum left lower eyelid: Secondary | ICD-10-CM | POA: Diagnosis not present

## 2023-03-13 DIAGNOSIS — H5213 Myopia, bilateral: Secondary | ICD-10-CM | POA: Diagnosis not present

## 2023-03-13 MED FILL — XOLAIR 300 MG/2 ML SUBCUTANEOUS SYRINGE: SUBCUTANEOUS | 28 days supply | Qty: 2 | Fill #0

## 2023-03-14 ENCOUNTER — Ambulatory Visit: Payer: BC Managed Care – PPO | Admitting: Physical Therapy

## 2023-03-16 ENCOUNTER — Ambulatory Visit: Payer: BC Managed Care – PPO | Admitting: Physical Therapy

## 2023-04-03 NOTE — Unmapped (Signed)
 [facility] Refill Coordination Note    Specialty Medication(s) to be Shipped:   CF/Pulmonary/Asthma: Xolair    Other medication(s) to be shipped: No additional medications requested for fill at this time     Natasha Copeland, DOB: Apr 04, 1965  Phone: [phone] (home)       All above HIPAA information was verified with patient.     Was a Nurse, learning disability used for this call? No    Completed refill call assessment today to schedule patient's medication shipment from the [facility]  [phone]).  All relevant notes have been reviewed.     Specialty medication(s) and dose(s) confirmed: Regimen is correct and unchanged.   Changes to medications: [name] reports no changes at this time.  Changes to insurance: No  New side effects reported not previously addressed with a pharmacist or physician: None reported  Questions for the pharmacist: No    Confirmed patient received a Conservation officer, historic buildings and a Surveyor, mining with first shipment. The patient will receive a drug information handout for each medication shipped and additional FDA Medication Guides as required.       DISEASE/MEDICATION-SPECIFIC INFORMATION        For patients on injectable medications: Patient currently has 0 doses left.  Next injection is scheduled for 3/24.    SPECIALTY MEDICATION ADHERENCE     Medication Adherence    Patient reported X missed doses in the last month: 0  Specialty Medication: XOLAIR 300 mg/2 mL Syrg syringe (omalizumab)  Patient is on additional specialty medications: No  Patient is on more than two specialty medications: No  Any gaps in refill history greater than 2 weeks in the last 3 months: no  Demonstrates understanding of importance of adherence: yes  Informant: patient  Confirmed plan for next specialty medication refill: delivery by pharmacy  Refills needed for supportive medications: not needed          Refill Coordination    Has the Patients' Contact Information Changed: No  Is the Shipping Address Different: No         Were doses missed due to medication being on hold? No    XOLAIR 300 /2  mg/ml: 0 doses of medicine on hand       REFERRAL TO PHARMACIST     Referral to the pharmacist: Not needed      [facility]     Shipping address confirmed in Epic.     Cost and Payment: Patient has a $0 copay, payment information is not required.    Delivery Scheduled: Yes, Expected medication delivery date: 04/07/23.     Medication will be delivered via UPS to the prescription address in Epic WAM.    Natasha Copeland   [facility]  Specialty Technician

## 2023-04-06 MED FILL — XOLAIR 300 MG/2 ML SUBCUTANEOUS SYRINGE: SUBCUTANEOUS | 28 days supply | Qty: 2 | Fill #1

## 2023-04-07 ENCOUNTER — Encounter: Payer: Self-pay | Admitting: Cardiology

## 2023-04-11 ENCOUNTER — Ambulatory Visit (INDEPENDENT_AMBULATORY_CARE_PROVIDER_SITE_OTHER): Payer: BC Managed Care – PPO | Admitting: Sports Medicine

## 2023-04-11 VITALS — BP 124/72 | Ht 58.5 in | Wt 93.0 lb

## 2023-04-11 DIAGNOSIS — S73001D Unspecified subluxation of right hip, subsequent encounter: Secondary | ICD-10-CM | POA: Diagnosis not present

## 2023-04-11 DIAGNOSIS — S73001S Unspecified subluxation of right hip, sequela: Secondary | ICD-10-CM

## 2023-04-11 DIAGNOSIS — M5010 Cervical disc disorder with radiculopathy, unspecified cervical region: Secondary | ICD-10-CM | POA: Diagnosis not present

## 2023-04-11 DIAGNOSIS — M21622 Bunionette of left foot: Secondary | ICD-10-CM | POA: Diagnosis not present

## 2023-04-11 DIAGNOSIS — M21621 Bunionette of right foot: Secondary | ICD-10-CM

## 2023-04-11 NOTE — Assessment & Plan Note (Signed)
 I suspect her chronic cervical issues trigger some of the chronic left trapezius spasm Topical massage and topical medication to this area Heat Exercises to emphasize trapezius motion See if we can lessen the spasm by activity with the muscle

## 2023-04-11 NOTE — Progress Notes (Signed)
 Patient has Ehlers-Danlos syndrome and several musculoskeletal complaints today  First complaint is the right fifth MTP joint has swelling and pain She has a bunionette and has had a bursal swelling over this in the past This appears inflamed again  Second complaint is hip flexor pain and hip internal rotation pain She thinks this probably occurred because she was walking with a bit of a limp because of the foot pain She also has been compensating some because her right SI joint seems to sublux and give her significant pain and that has been a chronic problem  Third complaint is left trapezius spasm.  On her last visit we tried ESWT and she got temporary relief but no significant long-term relief  Physical exam Pleasant female in no acute distress BP 124/72   Ht 4' 10.5" (1.486 m)   Wt 93 lb (42.2 kg)   BMI 19.11 kg/m  Right fifth MTP shows redness and swelling over the dorsum of the MTP where there is a bunionette  Left trapezius shows moderate spasm She has full range of motion at the neck  Right hip shows normal range of motion Most pain is on hip flexion and on palpation this seems to be over the iliopsoas tendon

## 2023-04-11 NOTE — Assessment & Plan Note (Signed)
 This hip has constantly cause some problems and currently I think she has got an iliopsoas strain from the increased mobility and also the SI joint issues on the right side  She is given home exercises to emphasize some easy hip rotation easy hip flexion and see if this will improve her symptoms

## 2023-04-11 NOTE — Assessment & Plan Note (Signed)
 This bunionette gets too much pressure from shoes and will periodically get a bursal type swelling that is inflamed I suggested topical Voltaren over this several times a day Try neuroma pads over the lateral foot to lift up the fifth MTP joint We may need additional padding and other shoes

## 2023-04-27 NOTE — Unmapped (Signed)
 [facility] and Home Delivery Pharmacy Refill Coordination Note    Natasha Copeland, DOB: May 07, 1965  Phone: [phone] (home)       All above HIPAA information was verified with patient.         04/27/2023     8:39 AM   Specialty Rx Medication Refill Questionnaire   Which Medications would you like refilled and shipped? Xolair / 0 day supply on hand   Please list all current allergies: Doxepin   Have you missed any doses in the last 30 days? No   Have you had any changes to your medication(s) since your last refill? No   How many days remaining of each medication do you have at home? 0   If receiving an injectable medication, next injection date is 05/15/2023   Have you experienced any side effects in the last 30 days? No   Please enter the full address (street address, city, state, zip code) where you would like your medication(s) to be delivered to. [place], [address], [place], [place]   Please specify on which day you would like your medication(s) to arrive. Note: if you need your medication(s) within 3 days, please call the pharmacy to schedule your order at [phone]  05/10/2023   Has your insurance changed since your last refill? No   Would you like a pharmacist to call you to discuss your medication(s)? No   Do you require a signature for your package? (Note: if we are billing Medicare Part B or your order contains a controlled substance, we will require a signature) No   I have been provided my out of pocket cost for my medication and approve the pharmacy to charge the amount to my credit card on file. Yes         Completed refill call assessment today to schedule patient's medication shipment from the [facility] [phone]).  All relevant notes have been reviewed.       Confirmed patient received a Conservation officer, historic buildings and a Surveyor, mining with first shipment. The patient will receive a drug information handout for each medication shipped and additional FDA Medication Guides as required.         REFERRAL TO PHARMACIST     Referral to the pharmacist: Not needed      [facility]     Shipping address confirmed in Epic.     Delivery Scheduled: Yes, Expected medication delivery date: 05/10/23.     Medication will be delivered via UPS to the prescription address in Epic WAM.    Ricci Barker   [facility] Special[facility] and Home Delivery Pharmacy Specialty Technician

## 2023-05-08 DIAGNOSIS — H53453 Other localized visual field defect, bilateral: Secondary | ICD-10-CM | POA: Diagnosis not present

## 2023-05-08 DIAGNOSIS — D352 Benign neoplasm of pituitary gland: Secondary | ICD-10-CM | POA: Diagnosis not present

## 2023-05-09 ENCOUNTER — Ambulatory Visit (INDEPENDENT_AMBULATORY_CARE_PROVIDER_SITE_OTHER): Admitting: Sports Medicine

## 2023-05-09 VITALS — BP 116/72 | Ht 58.5 in | Wt 93.0 lb

## 2023-05-09 DIAGNOSIS — M5010 Cervical disc disorder with radiculopathy, unspecified cervical region: Secondary | ICD-10-CM | POA: Diagnosis not present

## 2023-05-09 MED ORDER — PREDNISONE 20 MG PO TABS: 20.0000 mg | ORAL_TABLET | Freq: Two times a day (BID) | ORAL | 0 refills | Status: DC

## 2023-05-09 MED FILL — XOLAIR 300 MG/2 ML SUBCUTANEOUS SYRINGE: SUBCUTANEOUS | 28 days supply | Qty: 2 | Fill #2

## 2023-05-09 NOTE — Progress Notes (Signed)
 Chief complaint right-sided neck pain radiating down her arm to her hand  Patient with Ehlers-Danlos syndrome Previous history of a failed attempt at a fusion of C5-6 She had a herniated disc at the time with radiation into her left arm She has hypermobility of the neck and has had periodic radicular symptoms related to this This started 1 week ago and causes a lot of trapezius spasm on the right and tingling burning all the way down to her thumb and first finger  Additionally she is having more dense autonomic symptoms with color change and coldness in her hands and feet  Right SI joint issues have not really resolved but she has not been very active in the last few weeks so the pain has not increased  Physical exam Pleasant thin white female in no acute distress BP 116/72   Ht 4' 10.5" (1.486 m)   Wt 93 lb (42.2 kg)   BMI 19.11 kg/m  She does have a slightly head forward position Spasm over the right trapezius Neurologic testing and strength testing over the distributions C5-T1 is normal She has coldness of her hands and some color change in her left foot and right hand  Rotator cuff evaluation was unremarkable No scapular winging today

## 2023-05-09 NOTE — Patient Instructions (Addendum)
 This is probably C5/6 disc irritation  Let's try prednisone  20 twice daily for 7 days  Use your collar for rest  With spasm try heating your right trapezius [employer] for neck  I and T tretches

## 2023-05-09 NOTE — Assessment & Plan Note (Signed)
 Patient is having a recurrence of cervical radiculopathy symptoms  This time they are affecting her right arm while in the past they have affected her left arm  We mapped out to a C5-6 distribution she does feel symptoms when a test C5-6 strength  Plan is prednisone  20 mg twice daily x 7 days We will use heat  See patient information She will plan to check back with me in 6 weeks

## 2023-05-15 ENCOUNTER — Encounter: Payer: Self-pay | Admitting: Sports Medicine

## 2023-05-16 ENCOUNTER — Other Ambulatory Visit: Payer: Self-pay

## 2023-05-16 MED ORDER — PREDNISONE 20 MG PO TABS: 20.0000 mg | ORAL_TABLET | Freq: Two times a day (BID) | ORAL | 0 refills | Status: DC

## 2023-05-31 NOTE — Unmapped (Signed)
 [facility] and Home Delivery Pharmacy Refill Coordination Note    Natasha Copeland, DOB: 02/15/1965  Phone: [phone] (home)       All above HIPAA information was verified with patient.         05/31/2023    12:05 PM   Specialty Rx Medication Refill Questionnaire   Which Medications would you like refilled and shipped? Xolair    Please list all current allergies: Doxipen   Have you missed any doses in the last 30 days? No   Have you had any changes to your medication(s) since your last refill? No   How many days remaining of each medication do you have at home? 0   If receiving an injectable medication, next injection date is 06/12/2023   Have you experienced any side effects in the last 30 days? No   Please enter the full address (street address, city, state, zip code) where you would like your medication(s) to be delivered to. [address] [place], [place] [address]   Please specify on which day you would like your medication(s) to arrive. Note: if you need your medication(s) within 3 days, please call the pharmacy to schedule your order at [phone]  06/07/2023   Has your insurance changed since your last refill? No   Would you like a pharmacist to call you to discuss your medication(s)? No   Do you require a signature for your package? (Note: if we are billing Medicare Part B or your order contains a controlled substance, we will require a signature) No   I have been provided my out of pocket cost for my medication and approve the pharmacy to charge the amount to my credit card on file. Yes         Completed refill call assessment today to schedule patient's medication shipment from the [facility] [phone]).  All relevant notes have been reviewed.       Confirmed patient received a Conservation officer, historic buildings and a Surveyor, mining with first shipment. The patient will receive a drug information handout for each medication shipped and additional FDA Medication Guides as required.         REFERRAL TO PHARMACIST     Referral to the pharmacist: Not needed      [facility]     Shipping address confirmed in Epic.     Delivery Scheduled: Yes, Expected medication delivery date: 06/07/23.     Medication will be delivered via UPS to the prescription address in Epic WAM.    Stephen Ehrlich   [facility] Delivery Pharm[facility] Specialty Technician

## 2023-06-06 NOTE — Unmapped (Signed)
 Natasha Copeland 's Xolair  shipment will be delayed as a result of needing prescription clarification.      I have reached out to the patient  at [phone] and left a voicemail message.  We will call the patient back to reschedule the delivery upon resolution. We have not confirmed the new delivery date.

## 2023-06-07 MED ORDER — XOLAIR 300 MG/2 ML SUBCUTANEOUS SYRINGE
SUBCUTANEOUS | 6 refills | 28.00000 days
Start: 2023-06-07 — End: 2023-06-07

## 2023-06-07 MED FILL — XOLAIR 300 MG/2 ML SUBCUTANEOUS SYRINGE: SUBCUTANEOUS | 28 days supply | Qty: 2 | Fill #0

## 2023-06-19 ENCOUNTER — Ambulatory Visit (INDEPENDENT_AMBULATORY_CARE_PROVIDER_SITE_OTHER): Payer: BC Managed Care – PPO | Admitting: Internal Medicine

## 2023-06-19 ENCOUNTER — Encounter: Payer: Self-pay | Admitting: Internal Medicine

## 2023-06-19 VITALS — BP 110/78 | HR 82 | Temp 98.1°F | Ht 58.75 in | Wt 94.0 lb

## 2023-06-19 DIAGNOSIS — D8940 Mast cell activation, unspecified: Secondary | ICD-10-CM

## 2023-06-19 DIAGNOSIS — G90A Postural orthostatic tachycardia syndrome (POTS): Secondary | ICD-10-CM

## 2023-06-19 DIAGNOSIS — Q796 Ehlers-Danlos syndrome, unspecified: Secondary | ICD-10-CM

## 2023-06-19 DIAGNOSIS — Z0000 Encounter for general adult medical examination without abnormal findings: Secondary | ICD-10-CM | POA: Diagnosis not present

## 2023-06-19 DIAGNOSIS — Z23 Encounter for immunization: Secondary | ICD-10-CM

## 2023-06-19 LAB — COMPREHENSIVE METABOLIC PANEL WITH GFR
ALT: 13 U/L (ref 0–35)
AST: 18 U/L (ref 0–37)
Albumin: 4.4 g/dL (ref 3.5–5.2)
Alkaline Phosphatase: 25 U/L — ABNORMAL LOW (ref 39–117)
BUN: 5 mg/dL — ABNORMAL LOW (ref 6–23)
CO2: 25 meq/L (ref 19–32)
Calcium: 9 mg/dL (ref 8.4–10.5)
Chloride: 103 meq/L (ref 96–112)
Creatinine, Ser: 0.78 mg/dL (ref 0.40–1.20)
GFR: 82.89 mL/min (ref >60.00)
Glucose, Bld: 91 mg/dL (ref 70–99)
Potassium: 3.6 meq/L (ref 3.5–5.1)
Sodium: 137 meq/L (ref 135–145)
Total Bilirubin: 0.4 mg/dL (ref 0.2–1.2)
Total Protein: 7.3 g/dL (ref 6.0–8.3)

## 2023-06-19 LAB — LIPID PANEL
Cholesterol: 216 mg/dL — ABNORMAL HIGH (ref 0–200)
HDL: 93.7 mg/dL (ref >39.00)
LDL Cholesterol: 99 mg/dL (ref 0–99)
NonHDL: 121.99
Total CHOL/HDL Ratio: 2
Triglycerides: 117 mg/dL (ref 0.0–149.0)
VLDL: 23.4 mg/dL (ref 0.0–40.0)

## 2023-06-19 LAB — CBC WITH DIFFERENTIAL/PLATELET
Basophils Absolute: 0.1 10*3/uL (ref 0.0–0.1)
Basophils Relative: 1.1 % (ref 0.0–3.0)
Eosinophils Absolute: 0 10*3/uL (ref 0.0–0.7)
Eosinophils Relative: 0.5 % (ref 0.0–5.0)
HCT: 41.7 % (ref 36.0–46.0)
Hemoglobin: 13.8 g/dL (ref 12.0–15.0)
Lymphocytes Relative: 24.9 % (ref 12.0–46.0)
Lymphs Abs: 1.7 10*3/uL (ref 0.7–4.0)
MCHC: 33 g/dL (ref 30.0–36.0)
MCV: 95.3 fl (ref 78.0–100.0)
Monocytes Absolute: 0.4 10*3/uL (ref 0.1–1.0)
Monocytes Relative: 5.2 % (ref 3.0–12.0)
Neutro Abs: 4.6 10*3/uL (ref 1.4–7.7)
Neutrophils Relative %: 68.3 % (ref 43.0–77.0)
Platelets: 288 10*3/uL (ref 150.0–400.0)
RBC: 4.38 Mil/uL (ref 3.87–5.11)
RDW: 12.7 % (ref 11.5–15.5)
WBC: 6.8 10*3/uL (ref 4.0–10.5)

## 2023-06-19 LAB — VITAMIN D 25 HYDROXY (VIT D DEFICIENCY, FRACTURES): VITD: 28.85 ng/mL — ABNORMAL LOW (ref 30.00–100.00)

## 2023-06-19 LAB — TSH: TSH: 2.97 u[IU]/mL (ref 0.35–5.50)

## 2023-06-19 LAB — VITAMIN B12: Vitamin B-12: 314 pg/mL (ref 211–911)

## 2023-06-19 NOTE — Progress Notes (Signed)
 Established Patient Office Visit     CC/Reason for Visit: Annual preventive exam  HPI: Marissa [name] is a 58 y.o. female who is coming in today for the above mentioned reasons. Past Medical History is significant for: POTS, Erler's Danlos, mast cell activation syndrome.  Has routine eye and dental care.  Is scheduled for colonoscopy and follow-up with Dr. Reinhold Carbine later this year.  Is due for Tdap and PCV 20.   Past Medical/Surgical History: Past Medical History:  Diagnosis Date   Allergy    Anemia 10/24   Thought to be related to NSAID   Arthritis    Asthma    Ehlers-Danlos syndrome    GERD (gastroesophageal reflux disease)    History of pituitary tumor    Ocular migraine     Past Surgical History:  Procedure Laterality Date   KNEE SURGERY N/A 01/17/2010   SHOULDER SURGERY N/A 01/18/2007   SPINE SURGERY     C5-6 fusion   UTERINE FIBROID SURGERY N/A     Social History:  reports that she has never smoked. She has never used smokeless tobacco. She reports that she does not drink alcohol and does not use drugs.  Allergies: Allergies  Allergen Reactions   Doxepin  Rash    Reaction of Erythema multiforme   Duloxetine  Hcl Other (See Comments)   Macrobid [Nitrofurantoin Monohyd Macro]     Family History:  Family History  Problem Relation Age of Onset   Arthritis Mother    Atrial fibrillation Mother    Obesity Mother    Varicose Veins Mother    Arthritis Father    Diabetes Father    Heart disease Father    Hypertension Father    Heart failure Father    Obesity Father    Arthritis Sister    Asthma Sister    Diabetes Sister    Obesity Sister    Hearing loss Sister    Breast cancer Neg Hx      Current Outpatient Medications:    AMBULATORY NON FORMULARY MEDICATION, Naltrexone 1mg /cc  4cc three times daily, Disp: 360 mL, Rfl: 4   diazepam  (VALIUM ) 5 MG tablet, 1 po tid prn muscle spasm, Disp: 90 tablet, Rfl: 3   Fexofenadine HCl (ALLEGRA ALLERGY PO),  Allegra Allergy, Disp: , Rfl:    hydrOXYzine  (ATARAX ) 10 MG tablet, Take 1 tablet (10 mg total) by mouth 4 (four) times daily as needed., Disp: 90 tablet, Rfl: 1   ivabradine  (CORLANOR) 5 MG TABS tablet, Take 2.5 mg by mouth daily., Disp: , Rfl:    levocetirizine (XYZAL) 5 MG tablet, Take 5 mg by mouth every evening., Disp: , Rfl:    Multiple Vitamin (MULTIVITAMIN) capsule, Take 1 capsule by mouth daily., Disp: , Rfl:    norgestrel-ethinyl estradiol (CRYSELLE-28) 0.3-30 MG-MCG tablet, Cryselle (28) 0.3 mg-30 mcg tablet  TAKE 1 TABLET BY MOUTH EVERY DAY CONTINUOUSLY, Disp: , Rfl:    SYMBICORT 80-4.5 MCG/ACT inhaler, Inhale into the lungs., Disp: , Rfl:    VENTOLIN HFA 108 (90 Base) MCG/ACT inhaler, Inhale 2 puffs into the lungs every 4 (four) hours as needed., Disp: , Rfl:    XOLAIR  150 MG/ML prefilled syringe, , Disp: , Rfl:    zolpidem  (AMBIEN ) 10 MG tablet, TAKE 1/2-1 TABLETS BY MOUTH AT BEDTIME AS NEEDED, Disp: 30 tablet, Rfl: 3  Review of Systems:  Negative unless indicated in HPI.   Physical Exam: Vitals:   06/19/23 0757  BP: 110/78  Pulse: 82  Temp: 98.1 F (36.7 C)  TempSrc: Oral  SpO2: 99%  Weight: 94 lb (42.6 kg)  Height: 4' 10.75" (1.492 m)    Body mass index is 19.15 kg/m.   Physical Exam Vitals reviewed.  Constitutional:      General: She is not in acute distress.    Appearance: Normal appearance. She is not ill-appearing, toxic-appearing or diaphoretic.  HENT:     Head: Normocephalic.     Right Ear: Tympanic membrane, ear canal and external ear normal. There is no impacted cerumen.     Left Ear: Tympanic membrane, ear canal and external ear normal. There is no impacted cerumen.     Nose: Nose normal.     Mouth/Throat:     Mouth: Mucous membranes are moist.     Pharynx: Oropharynx is clear. No oropharyngeal exudate or posterior oropharyngeal erythema.  Eyes:     General: No scleral icterus.       Right eye: No discharge.        Left eye: No discharge.      Conjunctiva/sclera: Conjunctivae normal.     Pupils: Pupils are equal, round, and reactive to light.  Neck:     Vascular: No carotid bruit.  Cardiovascular:     Rate and Rhythm: Normal rate and regular rhythm.     Pulses: Normal pulses.     Heart sounds: Normal heart sounds.  Pulmonary:     Effort: Pulmonary effort is normal. No respiratory distress.     Breath sounds: Normal breath sounds.  Abdominal:     General: Abdomen is flat. Bowel sounds are normal.     Palpations: Abdomen is soft.  Musculoskeletal:        General: Normal range of motion.     Cervical back: Normal range of motion.  Skin:    General: Skin is warm and dry.  Neurological:     General: No focal deficit present.     Mental Status: She is alert and oriented to person, place, and time. Mental status is at baseline.  Psychiatric:        Mood and Affect: Mood normal.        Behavior: Behavior normal.        Thought Content: Thought content normal.        Judgment: Judgment normal.     Flowsheet Row Office Visit from 02/20/2023 in [facility] [employer]  PHQ-9 Total Score 7        Impression and Plan:  Encounter for preventive health examination  Immunization due  POTS (postural orthostatic tachycardia syndrome) -     CBC with Differential/Platelet; Future -     Comprehensive metabolic panel with GFR; Future -     Lipid panel; Future -     TSH; Future -     Vitamin B12; Future -     VITAMIN D  25 Hydroxy (Vit-D Deficiency, Fractures); Future  Mast cell activation syndrome (HCC)  Ehlers-Danlos disease   -Recommend routine eye and dental care. -Healthy lifestyle discussed in detail. -Labs to be updated today. -Prostate cancer screening: N/A Health Maintenance  Topic Date Due   HIV Screening  Never done   Pneumococcal Vaccination (1 of 2 - PCV) Never done   Pap with HPV screening  Never done   Colon Cancer Screening  Never done   Zoster (Shingles) Vaccine (2 of 2)  03/12/2018   COVID-19 Vaccine (5 - 2024-25 season) 09/18/2022   Flu Shot  08/18/2023   Mammogram  09/28/2023   DTaP/Tdap/Td vaccine (2 - Td or Tdap) 11/28/2023   Hepatitis C Screening  Completed   HPV Vaccine  Aged Out   Meningitis B Vaccine  Aged Out     - Tdap in office today. - PCV 20 at a later date. - Has colonoscopy and Pap smear scheduled for later this year.    Marguerita Shih, MD [facility]

## 2023-06-19 NOTE — Addendum Note (Signed)
 Addended by: Nicolina Barrios B on: 06/19/2023 09:44 AM   Modules accepted: Orders

## 2023-06-20 ENCOUNTER — Ambulatory Visit (INDEPENDENT_AMBULATORY_CARE_PROVIDER_SITE_OTHER): Admitting: Sports Medicine

## 2023-06-20 ENCOUNTER — Encounter: Payer: Self-pay | Admitting: Internal Medicine

## 2023-06-20 ENCOUNTER — Ambulatory Visit: Payer: Self-pay | Admitting: Internal Medicine

## 2023-06-20 VITALS — BP 104/72 | Ht 58.5 in | Wt 94.0 lb

## 2023-06-20 DIAGNOSIS — M792 Neuralgia and neuritis, unspecified: Secondary | ICD-10-CM | POA: Insufficient documentation

## 2023-06-20 DIAGNOSIS — E559 Vitamin D deficiency, unspecified: Secondary | ICD-10-CM | POA: Insufficient documentation

## 2023-06-20 MED ORDER — VITAMIN D (ERGOCALCIFEROL) 1.25 MG (50000 UNIT) PO CAPS: 50000.0000 [IU] | ORAL_CAPSULE | ORAL | 0 refills | Status: AC

## 2023-06-20 NOTE — Assessment & Plan Note (Signed)
 Today I tried ESWT for her right trapezius and right pectoralis major as both seem to have some spasm and I wondered if these were triggering some brachial plexus pressure  ESWT Impulses 2000 Power level 70 Head size large Frequency 12 1000 impulses given to the right trapezius and to the right pec major  At conclusion of that she actually felt somewhat better and would like to give this a trial with some treatments and will come back in 72 hours for her second treatment  If she fails this we may need to try something like Tegretol because she has had trouble with Lyrica  in the past and did not have a benefit with gabapentin  she has had a serious allergic reaction to doxepin  in the past

## 2023-06-20 NOTE — Progress Notes (Signed)
 Chief complaint persistent radicular pain down the outside of her right arm  This patient with Ehlers-Danlos syndrome has had multiple problems with radiculopathy from her neck She did have a failed fusion at C5-C6 Over the last couple months she has had particular problems with pain shooting down the outside of her right arm all the way to her hand In the past she has actually had more trouble with the left arm On the right side at 1 time she did have a pectoralis minor tear The pain feels somewhat different from some of her neck related issues but it does seem to be radiating in a pattern that goes into her 4th and 5th fingers She gets some dysautonomic change but has not noticed discoloration or increased coldness in her right hand  Physical exam Pleasant female in no acute distress BP 104/72   Ht 4' 10.5" (1.486 m)   Wt 94 lb (42.6 kg)   BMI 19.31 kg/m  She has excellent range of motion of her neck today and this does not seem to trigger her symptoms She does have trapezius spasm and perhaps some spasm in the right pectoralis major Testing for thoracic outlet syndrome I do not see any vascular change with a sweep or Roos test She has full sensation in her hand and arm  Evaluation of the shoulder she gets some easy subluxation at the glenohumeral joint bilaterally

## 2023-06-23 ENCOUNTER — Ambulatory Visit (INDEPENDENT_AMBULATORY_CARE_PROVIDER_SITE_OTHER): Payer: Self-pay | Admitting: Sports Medicine

## 2023-06-23 DIAGNOSIS — M792 Neuralgia and neuritis, unspecified: Secondary | ICD-10-CM

## 2023-06-23 NOTE — Progress Notes (Signed)
   PCP: Zilphia Hilt, Charyl Coppersmith, MD  SUBJECTIVE:   HPI:  Patient is a 58 y.o. female with h/o Ehlers Danlos Syndrome here with chief complaint of radicular right arm pain. She did her first trial of ESWT for this issue on Tuesday 6/2 and presents for a second treatment today. She notes about 24hr of improvement following her last session.   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 Trial of extracorporeal shockwave therapy. Hoping to do a total of 3-4 sessions. There was discussion with Dr. Nolene Baumgarten earlier this week about potentially trying Tegretol, however she has had severe erythema multiforme reaction to Doxepin  in the past hospitalizing her, so she is not in favor of that idea. F/u next week.  Procedure: ESWT Indications:  Right Arm Radiculopathy   Procedure Details Consent: Risks of procedure as well as the alternatives and risks of each were explained to the patient.  Written consent for procedure obtained. Time Out: Verified patient identification, verified procedure, site was marked, verified correct patient position, medications/allergies/relevent history reviewed.  The area was cleaned with alcohol swab.     The right trapezius and pectoralis were targeted for Extracorporeal shockwave therapy.    Preset: s/p muscle injury Power Level: 80mJ Frequency: 12Hz  Impulse/cycles: 1000 to the trapezius and 1000 to the pec major Head size: Large   Patient tolerated procedure well without immediate complications   Lin Rend, MD PGY-4, Sports Medicine Fellow [facility]

## 2023-06-23 NOTE — Assessment & Plan Note (Signed)
 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 Trial of extracorporeal shockwave therapy. Hoping to do a total of 3-4 sessions. There was discussion with Dr. Nolene Baumgarten earlier this week about potentially trying Tegretol, however she has had severe erythema multiforme reaction to Doxepin  in the past hospitalizing her, so she is not in favor of that idea. F/u next week.  Procedure: ESWT Indications:  Right Arm Radiculopathy   Procedure Details Consent: Risks of procedure as well as the alternatives and risks of each were explained to the patient.  Written consent for procedure obtained. Time Out: Verified patient identification, verified procedure, site was marked, verified correct patient position, medications/allergies/relevent history reviewed.  The area was cleaned with alcohol swab.     The right trapezius and pectoralis were targeted for Extracorporeal shockwave therapy.    Preset: s/p muscle injury Power Level: 80mJ Frequency: 12Hz  Impulse/cycles: 1000 to the trapezius and 1000 to the pec major Head size: Large   Patient tolerated procedure well without immediate complications

## 2023-06-26 ENCOUNTER — Ambulatory Visit (INDEPENDENT_AMBULATORY_CARE_PROVIDER_SITE_OTHER): Payer: Self-pay | Admitting: Family Medicine

## 2023-06-26 DIAGNOSIS — M792 Neuralgia and neuritis, unspecified: Secondary | ICD-10-CM

## 2023-06-26 NOTE — Progress Notes (Signed)
 Patient returns for third shockwave treatment to right trapezius and pectoralis area. Unfortunately not noticed much benefit to date and uncertain about continuing treatments beyond today. Has some very temporary relief.  Procedure: ECSWT Indications:  right cervical radiculopathy   Procedure Details Consent: Risks of procedure as well as the alternatives and risks of each were explained to the patient.  Written consent for procedure obtained. Time Out: Verified patient identification, verified procedure, site was marked, verified correct patient position, medications/allergies/relevent history reviewed.  The area was cleaned with alcohol swab.     The right trapezius and pectoralis were targeted for Extracorporeal shockwave therapy.    Preset: s/p muscle injury Power Level: 80 Frequency: 12 Impulse/cycles: 1000 to each right trapezius and right pec major Head size: large   Patient tolerated procedure well without immediate complications.  Will touch base with Dr. Nolene Baumgarten about plan going forward.

## 2023-06-27 NOTE — Telephone Encounter (Signed)
 Patient identification verified by 2 forms. Marissa Duck, RN     Called and spoke to patient  Relayed provider recommendations  Patient aware:  - Dr. Emmette Harms recommended sooner appt with APP   Interventions/Plan - Offered APP appointment for availability in July. Patient declined at this time.  - Next available with Dr. Emmette Harms was 9/25. Patient scheduled.  - Encounter forwarded to primary nurse for review.    Patient verbalized understanding, no questions at this time

## 2023-06-28 NOTE — Unmapped (Signed)
 [facility] Clinical Assessment & Refill Coordination Note    Natasha Copeland, DOB: 10-13-1965  Phone: [phone] (home)     All above HIPAA information was verified with patient.     Was a Nurse, learning disability used for this call? No    Specialty Medication(s):   Inflammatory Disorders: Xolair      Current Medications[1]     Changes to medications: Dafna reports no changes at this time.    Medication list has been reviewed and updated in Epic: Yes    Allergies[2]    Changes to allergies: No    Allergies have been reviewed and updated in Epic: Yes    SPECIALTY MEDICATION ADHERENCE     Xolair  150 mg/ml: 0 doses of medicine on hand     Medication Adherence    Patient reported X missed doses in the last month: 0  Specialty Medication: Xolair  300 mg/2mL Q28d  Patient is on additional specialty medications: No  Patient is on more than two specialty medications: No  Any gaps in refill history greater than 2 weeks in the last 3 months: no  Demonstrates understanding of importance of adherence: yes  Informant: patient          Specialty medication(s) dose(s) confirmed: Regimen is correct and unchanged.     Are there any concerns with adherence? No    Adherence counseling provided? Not needed    CLINICAL MANAGEMENT AND INTERVENTION      Clinical Benefit Assessment:    Do you feel the medicine is effective or helping your condition? Yes    Clinical Benefit counseling provided? Not needed    Adverse Effects Assessment:    Are you experiencing any side effects? No    Are you experiencing difficulty administering your medicine? No    Quality of Life Assessment:    Quality of Life    Rheumatology  Oncology  Dermatology  Cystic Fibrosis          How many days over the past month did your idiopathic urticaria  keep you from your normal activities? For example, brushing your teeth or getting up in the morning. 0     Have you discussed this with your provider? Not needed    Acute Infection Status:    Acute infections noted within Epic:  No active infections    Patient reported infection: None    Therapy Appropriateness:    Is therapy appropriate based on current medication list, adverse reactions, adherence, clinical benefit and progress toward achieving therapeutic goals? Yes, therapy is appropriate and should be continued     Clinical Intervention:    Was an intervention completed as part of this clinical assessment? No    DISEASE/MEDICATION-SPECIFIC INFORMATION      For patients on injectable medications: Patient currently has 0 doses left.  Next injection is scheduled for 6/23.    Chronic Inflammatory Diseases: Have you experienced any flares in the last month? Yes, but only when wearing lidocaine patches. 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 Yes, only using lidocaine patches when needed.    PATIENT SPECIFIC NEEDS     Does the patient have any physical, cognitive, or cultural barriers? No    Is the patient high risk? No    Does the patient require physician intervention or other additional services (i.e., nutrition, smoking cessation, social work)? No    Does the patient have an additional or emergency contact listed  in their chart? Yes    SOCIAL DETERMINANTS OF HEALTH     At the [facility], we have learned that life circumstances - like trouble affording food, housing, utilities, or transportation can affect the health of many of our patients.   That is why we wanted to ask: are you currently experiencing any life circumstances that are negatively impacting your health and/or quality of life? Patient declined to answer    Social Drivers of Health     Food Insecurity: Not on file   Tobacco Use: Not on file (04/17/2012)   Transportation Needs: Not on file   Alcohol Use: Not on file   Housing: Not on file   Physical Activity: Not on file   Utilities: Not on file   Stress: Not on file   Interpersonal Safety: Not on file   Substance Use: Not on file (06/28/2023) Intimate Partner Violence: Not on file   Social Connections: Not on file   Financial Resource Strain: Not on file   Health Literacy: Not on file   Internet Connectivity: Not on file       Would you be willing to receive help with any of the needs that you have identified today? Not applicable       SHIPPING     Specialty Medication(s) to be Shipped:   Inflammatory Disorders: Xolair     Other medication(s) to be shipped: No additional medications requested for fill at this time     Changes to insurance: No    Cost and Payment: Patient has a $0 copay, payment information is not required.    Delivery Scheduled: Yes, Expected medication delivery date: 07/05/23.     Medication will be delivered via UPS to the confirmed prescription address in [facility].    The patient will receive a drug information handout for each medication shipped and additional FDA Medication Guides as required.  Verified that patient has previously received a Conservation officer, historic buildings and a Surveyor, mining.    The patient or caregiver noted above participated in the development of this care plan and knows that they can request review of or adjustments to the care plan at any time.      All of the patient's questions and concerns have been addressed.    Joseph Nickel, PharmD   [facility] Specialty Pharmacist       [1]   Current Outpatient Medications   Medication Sig Dispense Refill    ergocalciferol-1,250 mcg, 50,000 unit, (DRISDOL) 1,250 mcg (50,000 unit) capsule Take 1 capsule (1,250 mcg total) by mouth once a week.      zolpidem (AMBIEN) 10 mg tablet TAKE 1/2-1 TABLETS BY MOUTH AT BEDTIME AS NEEDED      cimetidine (TAGAMET) 400 MG tablet Take 1 tablet (400 mg total) by mouth two (2) times a day.      diazePAM (VALIUM) 5 MG tablet Take 1 tablet (5 mg total) by mouth every eight (8) hours as needed for anxiety.      diclofenac (VOLTAREN) 50 MG EC tablet Take 1 tablet (50 mg total) by mouth two (2) times a day. fexofenadine (ALLEGRA) 180 MG tablet Take 1 tablet (180 mg total) by mouth daily.      levocetirizine (XYZAL) 5 MG tablet Take 1 tablet (5 mg total) by mouth every evening.      naloxone HCl (NALOXONE, BULK,) Powd 4 mg by Miscellaneous route two (2) times a day.  norgestrel-ethinyl estradioL (LO/OVRAL) 0.3-30 mg-mcg per tablet Take 1 tablet by mouth daily.      omalizumab  (XOLAIR ) 300 mg/2 mL auto-injector Inject the contents of 1 pen (300 mg) under the skin every twenty-eight (28) days. 2 mL 6    omalizumab  (XOLAIR ) 300 mg/2 mL syringe Inject the contents of 1 syringe (300 mg total) under the skin every twenty-eight (28) days. 2 mL 6     No current facility-administered medications for this visit.   [2]   Allergies  Allergen Reactions    Doxepin      Reaction of Erythema multiforme

## 2023-07-04 MED FILL — XOLAIR 300 MG/2 ML SUBCUTANEOUS SYRINGE: SUBCUTANEOUS | 28 days supply | Qty: 2 | Fill #1

## 2023-07-13 DIAGNOSIS — Z800 Family history of malignant neoplasm of digestive organs: Secondary | ICD-10-CM | POA: Diagnosis not present

## 2023-07-13 DIAGNOSIS — G90A Postural orthostatic tachycardia syndrome (POTS): Secondary | ICD-10-CM | POA: Diagnosis not present

## 2023-07-13 DIAGNOSIS — Z1211 Encounter for screening for malignant neoplasm of colon: Secondary | ICD-10-CM | POA: Diagnosis not present

## 2023-07-13 DIAGNOSIS — K5900 Constipation, unspecified: Secondary | ICD-10-CM | POA: Diagnosis not present

## 2023-07-13 NOTE — Telephone Encounter (Signed)
 Appt made

## 2023-07-20 ENCOUNTER — Encounter: Payer: Self-pay | Admitting: Sports Medicine

## 2023-07-20 ENCOUNTER — Ambulatory Visit (INDEPENDENT_AMBULATORY_CARE_PROVIDER_SITE_OTHER): Admitting: Sports Medicine

## 2023-07-20 VITALS — BP 128/66 | Wt 94.0 lb

## 2023-07-20 DIAGNOSIS — M792 Neuralgia and neuritis, unspecified: Secondary | ICD-10-CM

## 2023-07-20 DIAGNOSIS — M5010 Cervical disc disorder with radiculopathy, unspecified cervical region: Secondary | ICD-10-CM | POA: Diagnosis not present

## 2023-07-20 MED ORDER — FLUCONAZOLE 150 MG PO TABS: 150.0000 mg | ORAL_TABLET | Freq: Every day | ORAL | 0 refills | Status: DC

## 2023-07-20 MED ORDER — PREDNISONE 20 MG PO TABS: 20.0000 mg | ORAL_TABLET | Freq: Two times a day (BID) | ORAL | 0 refills | Status: DC

## 2023-07-20 NOTE — Progress Notes (Signed)
 Chief complaint right arm shoulder and neck pain  Patient with known cervical disc disease including a failed cervical fusion in the past. She has Ehlers-Danlos syndrome with hypermobility. She has now had weeks of radicular pain going down her right arm and into her right clavicular area. She does not feel weakness. She feels tingling more over her face on the right side and rarely in her arm.  We have tried her on gabapentin  without success in the past.  She had a reaction to Lyrica .  She had a reaction to doxepin .  Currently the only medication given her much relief is when she uses a half a tablet of diazepam  she gets less muscle spasm.  Pain is fairly constant and is affecting both her sleep and work  Physical exam Thin white female in some discomfort but no distress BP 128/66 (BP Location: Left Arm, Patient Position: Sitting)   Wt 94 lb (42.6 kg)   BMI 19.31 kg/m   Neck motion is full and somewhat hypermobile.  Neck extension does create some pain.  Neurologic testing of C5-T1 reveals that all muscle groups showing normal strength. I could not find any objective sensory changes in the right upper extremity and possibly some mild sensory deficit in the cheek and fifth finger Reflexes show that the right triceps is 1+ with left triceps 3+ both biceps are 3+

## 2023-07-20 NOTE — Assessment & Plan Note (Signed)
 With the persistence of this pain for a long time and without relief I believe we need to get an MRI to see if she has a significant disc herniation. Schedule MRI today All right to try massage therapy to the neck Another course of prednisone  20 mg twice daily for 7 days.  She took 2 weeks of prednisone  back in April and it helped her acute symptoms  Consider trying TENS unit or other therapies just for pain relief but would like to see the MRI results first if possible Recheck in 3 to 4 weeks pending MRI results

## 2023-07-22 ENCOUNTER — Ambulatory Visit
Admission: RE | Admit: 2023-07-22 | Discharge: 2023-07-22 | Disposition: A | Discharge status: Home / Self Care | Source: Ambulatory Visit | Attending: Sports Medicine | Admitting: Sports Medicine

## 2023-07-22 DIAGNOSIS — Z981 Arthrodesis status: Secondary | ICD-10-CM | POA: Diagnosis not present

## 2023-07-22 DIAGNOSIS — M792 Neuralgia and neuritis, unspecified: Secondary | ICD-10-CM

## 2023-07-22 DIAGNOSIS — M5412 Radiculopathy, cervical region: Secondary | ICD-10-CM | POA: Diagnosis not present

## 2023-07-22 DIAGNOSIS — M5010 Cervical disc disorder with radiculopathy, unspecified cervical region: Secondary | ICD-10-CM

## 2023-07-25 ENCOUNTER — Ambulatory Visit: Attending: Cardiology | Admitting: Cardiology

## 2023-07-25 ENCOUNTER — Other Ambulatory Visit: Payer: Self-pay

## 2023-07-25 ENCOUNTER — Encounter: Payer: Self-pay | Admitting: Sports Medicine

## 2023-07-25 ENCOUNTER — Encounter: Payer: Self-pay | Admitting: Cardiology

## 2023-07-25 VITALS — BP 120/70 | HR 85 | Ht <= 58 in | Wt 94.2 lb

## 2023-07-25 DIAGNOSIS — G90A Postural orthostatic tachycardia syndrome (POTS): Secondary | ICD-10-CM

## 2023-07-25 DIAGNOSIS — R002 Palpitations: Secondary | ICD-10-CM

## 2023-07-25 DIAGNOSIS — Q796 Ehlers-Danlos syndrome, unspecified: Secondary | ICD-10-CM | POA: Diagnosis not present

## 2023-07-25 DIAGNOSIS — M792 Neuralgia and neuritis, unspecified: Secondary | ICD-10-CM

## 2023-07-25 DIAGNOSIS — M5010 Cervical disc disorder with radiculopathy, unspecified cervical region: Secondary | ICD-10-CM

## 2023-07-25 NOTE — Progress Notes (Signed)
 Cardiology Office Note:    Date:  07/28/2023   ID:  Marissa [name], DOB 1965-02-16, MRN [id number]  PCP:  Theophilus Andrews, Tully GRADE, MD  Cardiologist:  Dub Huntsman, DO  Electrophysiologist:  Will Gladis Norton, MD   Referring MD: Theophilus Andrews, Estel*     History of Present Illness:    Marissa [name] is a 58 y.o. female with a hx of Ehlers-Danlos syndrome, POTS, mast cell activation syndrome here today for follow-up visit.    Her medical history includes Ehlers-Danlos syndrome, which contributes to joint instability and circulation issues, such as cold feet. She also has a non-secreting pituitary tumor, monitored with ophthalmological evaluations.  The patient previously saw Dr. Hobart.  This is my first visit with her.  Her last visit with Dr. Hobart was December 03, 2021.  During our visit they reviewed her echocardiogram From 2022.  And a repeat echocardiogram was done for her.  She also has seen Dr. Norton in the past at that time she was on a metoprolol  at that time and will stop and she was placed on Cardizem .  Her blood pressure does not tolerate these medications.  She then in September 2021 saw Dr. Fernande.  I visited discussed her dysautonomia today talked about the role of salt and water repletion.  At her last visit she was experiencing palpitations - given the fact that she has know POTS we discussed the know of Ivabradine . This was not approved by the insurance company. She then paid out of pocket. She started taking the medication but stop it due to suspected associated symptoms.   She is being worked up for cervical radiculopathy. 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   She experiences palpitations and occasional presyncopal episodes, especially when standing quickly or for extended periods. She manages these symptoms with hydration and electrolytes. There is no recent worsening of these symptoms.   Past Medical History:  Diagnosis Date   Allergy    Anemia  10/24   Thought to be related to NSAID   Arthritis    Asthma    Ehlers-Danlos syndrome    GERD (gastroesophageal reflux disease)    History of pituitary tumor    Ocular migraine     Past Surgical History:  Procedure Laterality Date   KNEE SURGERY N/A 01/17/2010   SHOULDER SURGERY N/A 01/18/2007   SPINE SURGERY     C5-6 fusion   UTERINE FIBROID SURGERY N/A     Current Medications: Current Meds  Medication Sig   AMBULATORY NON FORMULARY MEDICATION Naltrexone 1mg /cc  4cc three times daily   diazepam  (VALIUM ) 5 MG tablet 1 po tid prn muscle spasm   Fexofenadine HCl (ALLEGRA ALLERGY PO) Allegra Allergy   fluconazole  (DIFLUCAN ) 150 MG tablet Take 1 tablet (150 mg total) by mouth daily.   hydrOXYzine  (ATARAX ) 10 MG tablet Take 1 tablet (10 mg total) by mouth 4 (four) times daily as needed.   levocetirizine (XYZAL) 5 MG tablet Take 5 mg by mouth every evening.   Multiple Vitamin (MULTIVITAMIN) capsule Take 1 capsule by mouth daily.   norgestrel-ethinyl estradiol (CRYSELLE-28) 0.3-30 MG-MCG tablet Cryselle (28) 0.3 mg-30 mcg tablet  TAKE 1 TABLET BY MOUTH EVERY DAY CONTINUOUSLY   predniSONE  (DELTASONE ) 20 MG tablet Take 1 tablet (20 mg total) by mouth 2 (two) times daily.   SYMBICORT 80-4.5 MCG/ACT inhaler Inhale into the lungs.   VENTOLIN HFA 108 (90 Base) MCG/ACT inhaler Inhale 2 puffs into the lungs every 4 (four)  hours as needed.   Vitamin D , Ergocalciferol , (DRISDOL ) 1.25 MG (50000 UNIT) CAPS capsule Take 1 capsule (50,000 Units total) by mouth every 7 (seven) days for 12 doses.   XOLAIR  150 MG/ML prefilled syringe    zolpidem  (AMBIEN ) 10 MG tablet TAKE 1/2-1 TABLETS BY MOUTH AT BEDTIME AS NEEDED     Allergies:   Doxepin , Duloxetine  hcl, and Macrobid [nitrofurantoin monohyd macro]   Social History   Socioeconomic History   Marital status: Single    Spouse name: Not on file   Number of children: 0   Years of education: Masters   Highest education level: Master's degree  (e.g., MA, MS, MEng, MEd, MSW, MBA)  Occupational History    Employer: [employer]  Tobacco Use   Smoking status: Never   Smokeless tobacco: Never  Vaping Use   Vaping status: Never Used  Substance and Sexual Activity   Alcohol use: No   Drug use: No   Sexual activity: Not Currently    Birth control/protection: Pill  Other Topics Concern   Not on file  Social History Narrative   Not on file   Social Drivers of Health   Financial Resource Strain: Low Risk  (01/10/2023)   Overall Financial Resource Strain (CARDIA)    Difficulty of Paying Living Expenses: Not hard at all  Food Insecurity: No Food Insecurity (01/10/2023)   Hunger Vital Sign    Worried About Running Out of Food in the Last Year: Never true    Ran Out of Food in the Last Year: Never true  Transportation Needs: No Transportation Needs (01/10/2023)   PRAPARE - Administrator, Civil Service (Medical): No    Lack of Transportation (Non-Medical): No  Physical Activity: Sufficiently Active (01/10/2023)   Exercise Vital Sign    Days of Exercise per Week: 3 days    Minutes of Exercise per Session: 50 min  Stress: No Stress Concern Present (01/10/2023)   [employer] of Occupational Health - Occupational Stress Questionnaire    Feeling of Stress : Only a little  Social Connections: Moderately Integrated (01/10/2023)   Social Connection and Isolation Panel    Frequency of Communication with Friends and Family: More than three times a week    Frequency of Social Gatherings with Friends and Family: Three times a week    Attends Religious Services: More than 4 times per year    Active Member of Clubs or Organizations: Yes    Attends Engineer, structural: More than 4 times per year    Marital Status: Never married     Family History: The patient's family history includes Arthritis in her father, mother, and sister; Asthma in her sister; Atrial fibrillation in her mother; Diabetes in her  father and sister; Hearing loss in her sister; Heart disease in her father; Heart failure in her father; Hypertension in her father; Obesity in her father, mother, and sister; Varicose Veins in her mother. There is no history of Breast cancer.  ROS:   Review of Systems  Constitution: Negative for decreased appetite, fever and weight gain.  HENT: Negative for congestion, ear discharge, hoarse voice and sore throat.   Eyes: Negative for discharge, redness, vision loss in right eye and visual halos.  Cardiovascular: Negative for chest pain, dyspnea on exertion, leg swelling, orthopnea and palpitations.  Respiratory: Negative for cough, hemoptysis, shortness of breath and snoring.   Endocrine: Negative for heat intolerance and polyphagia.  Hematologic/Lymphatic: Negative for bleeding problem. Does not  bruise/bleed easily.  Skin: Negative for flushing, nail changes, rash and suspicious lesions.  Musculoskeletal: Negative for arthritis, joint pain, muscle cramps, myalgias, neck pain and stiffness.  Gastrointestinal: Negative for abdominal pain, bowel incontinence, diarrhea and excessive appetite.  Genitourinary: Negative for decreased libido, genital sores and incomplete emptying.  Neurological: Negative for brief paralysis, focal weakness, headaches and loss of balance.  Psychiatric/Behavioral: Negative for altered mental status, depression and suicidal ideas.  Allergic/Immunologic: Negative for HIV exposure and persistent infections.    EKGs/Labs/Other Studies Reviewed:    The following studies were reviewed today:   EKG:  The ekg ordered today demonstrates normal sinus rhythm  Recent Labs: 12/21/2022: Magnesium 2.0 06/19/2023: ALT 13; TSH 2.97 07/26/2023: BUN 6; Creatinine, Ser 0.68; Hemoglobin 12.9; Platelets 285; Potassium 3.2; Sodium 141  Recent Lipid Panel    Component Value Date/Time   CHOL 216 (H) 06/19/2023 0856   CHOL 173 11/04/2015 1046   TRIG 117.0 06/19/2023 0856   TRIG 95  11/04/2015 1046   HDL 93.70 06/19/2023 0856   HDL 77 11/04/2015 1046   CHOLHDL 2 06/19/2023 0856   VLDL 23.4 06/19/2023 0856   LDLCALC 99 06/19/2023 0856   LDLCALC 78 11/04/2015 1046    Physical Exam:    VS:  BP 120/70 (BP Location: Right Arm, Patient Position: Sitting, Cuff Size: Normal)   Pulse 85   Ht 4' 10 (1.473 m)   Wt 94 lb 3.2 oz (42.7 kg)   SpO2 99%   BMI 19.69 kg/m     Wt Readings from Last 3 Encounters:  07/28/23 93 lb 4.8 oz (42.3 kg)  07/26/23 94 lb (42.6 kg)  07/25/23 94 lb 3.2 oz (42.7 kg)     GEN: Well nourished, well developed in no acute distress HEENT: Normal NECK: No JVD; No carotid bruits LYMPHATICS: No lymphadenopathy CARDIAC: S1S2 noted,RRR, no murmurs, rubs, gallops RESPIRATORY:  Clear to auscultation without rales, wheezing or rhonchi  ABDOMEN: Soft, non-tender, non-distended, +bowel sounds, no guarding. EXTREMITIES: No edema, No cyanosis, no clubbing MUSCULOSKELETAL:  No deformity  SKIN: Warm and dry NEUROLOGIC:  Alert and oriented x 3, non-focal PSYCHIATRIC:  Normal affect, good insight  ASSESSMENT:    1. Palpitations   2. Ehlers-Danlos disease   3. POTS (postural orthostatic tachycardia syndrome)    PLAN:     Palpitations Intermittent palpitations with occasional presyncope, possibly orthostatic. Recent vertigo episode may have contributed. Managed with hydration and electrolyte supplementation. Previous Colchicine effective but insurance issues present. She stopped the Ivabradine  for concern of associated symptoms.   Ehlers-Danlos syndrome Ehlers-Danlos syndrome causing joint instability and circulation issues. Hip dislocation and cold feet with bounding pulses likely related. Regular echocardiograms as appropriate.  Pituitary tumor, non-secreting Non-secreting pituitary tumor monitored with MRIs and ophthalmological evaluations. No recent changes or secretions.  The patient is in agreement with the above plan. The patient left  the office in stable condition.  The patient will follow up in 9 months.   Medication Adjustments/Labs and Tests Ordered: Current medicines are reviewed at length with the patient today.  Concerns regarding medicines are outlined above.  Orders Placed This Encounter  Procedures   EKG 12-Lead   No orders of the defined types were placed in this encounter.   Patient Instructions  Medication Instructions:  Your physician recommends that you continue on your current medications as directed. Please refer to the Current Medication list given to you today.  *If you need a refill on your cardiac medications before your next appointment, please  call your pharmacy*   Follow-Up: At [facility], you and your health needs are our priority.  As part of our continuing mission to provide you with exceptional heart care, our providers are all part of one team.  This team includes your primary Cardiologist (physician) and Advanced Practice Providers or APPs (Physician Assistants and Nurse Practitioners) who all work together to provide you with the care you need, when you need it.  Your next appointment:   9 month(s)  Provider:   Sumie Remsen, DO          Adopting a Healthy Lifestyle.  Know what a healthy weight is for you (roughly BMI <25) and aim to maintain this   Aim for 7+ servings of fruits and vegetables daily   65-80+ fluid ounces of water or unsweet tea for healthy kidneys   Limit to max 1 drink of alcohol per day; avoid smoking/tobacco   Limit animal fats in diet for cholesterol and heart health - choose grass fed whenever available   Avoid highly processed foods, and foods high in saturated/trans fats   Aim for low stress - take time to unwind and care for your mental health   Aim for 150 min of moderate intensity exercise weekly for heart health, and weights twice weekly for bone health   Aim for 7-9 hours of sleep daily   When it comes to diets, agreement about  the perfect plan isnt easy to find, even among the experts. Experts at the [facility] [employer] developed an idea known as the Healthy Eating Plate. Just imagine a plate divided into logical, healthy portions.   The emphasis is on diet quality:   Load up on vegetables and fruits - one-half of your plate: Aim for color and variety, and remember that potatoes dont count.   Go for whole grains - one-quarter of your plate: Whole wheat, barley, wheat berries, quinoa, oats, brown rice, and foods made with them. If you want pasta, go with whole wheat pasta.   Protein power - one-quarter of your plate: Fish, chicken, beans, and nuts are all healthy, versatile protein sources. Limit red meat.   The diet, however, does go beyond the plate, offering a few other suggestions.   Use healthy plant oils, such as olive, canola, soy, corn, sunflower and peanut. Check the labels, and avoid partially hydrogenated oil, which have unhealthy trans fats.   If youre thirsty, drink water. Coffee and tea are good in moderation, but skip sugary drinks and limit milk and dairy products to one or two daily servings.   The type of carbohydrate in the diet is more important than the amount. Some sources of carbohydrates, such as vegetables, fruits, whole grains, and beans-are healthier than others.   Finally, stay active  Signed, Dub Huntsman, DO  07/28/2023 8:27 PM    [facility]

## 2023-07-25 NOTE — Patient Instructions (Signed)

## 2023-07-26 ENCOUNTER — Emergency Department (HOSPITAL_BASED_OUTPATIENT_CLINIC_OR_DEPARTMENT_OTHER)

## 2023-07-26 ENCOUNTER — Other Ambulatory Visit: Payer: Self-pay

## 2023-07-26 ENCOUNTER — Encounter (HOSPITAL_BASED_OUTPATIENT_CLINIC_OR_DEPARTMENT_OTHER): Payer: Self-pay | Admitting: Emergency Medicine

## 2023-07-26 ENCOUNTER — Emergency Department (HOSPITAL_BASED_OUTPATIENT_CLINIC_OR_DEPARTMENT_OTHER)
Admission: EM | Admit: 2023-07-26 | Discharge: 2023-07-26 | Disposition: A | Discharge status: Home / Self Care | Attending: Emergency Medicine | Admitting: Emergency Medicine

## 2023-07-26 DIAGNOSIS — E876 Hypokalemia: Secondary | ICD-10-CM | POA: Insufficient documentation

## 2023-07-26 DIAGNOSIS — R519 Headache, unspecified: Secondary | ICD-10-CM | POA: Insufficient documentation

## 2023-07-26 DIAGNOSIS — Z86018 Personal history of other benign neoplasm: Secondary | ICD-10-CM | POA: Diagnosis not present

## 2023-07-26 DIAGNOSIS — D485 Neoplasm of uncertain behavior of skin: Secondary | ICD-10-CM | POA: Diagnosis not present

## 2023-07-26 DIAGNOSIS — L578 Other skin changes due to chronic exposure to nonionizing radiation: Secondary | ICD-10-CM | POA: Diagnosis not present

## 2023-07-26 DIAGNOSIS — L821 Other seborrheic keratosis: Secondary | ICD-10-CM | POA: Diagnosis not present

## 2023-07-26 DIAGNOSIS — D225 Melanocytic nevi of trunk: Secondary | ICD-10-CM | POA: Diagnosis not present

## 2023-07-26 DIAGNOSIS — D2272 Melanocytic nevi of left lower limb, including hip: Secondary | ICD-10-CM | POA: Diagnosis not present

## 2023-07-26 LAB — BASIC METABOLIC PANEL WITH GFR
Anion gap: 14 (ref 5–15)
BUN: 6 mg/dL (ref 6–20)
CO2: 21 mmol/L — ABNORMAL LOW (ref 22–32)
Calcium: 8.9 mg/dL (ref 8.9–10.3)
Chloride: 105 mmol/L (ref 98–111)
Creatinine, Ser: 0.68 mg/dL (ref 0.44–1.00)
GFR, Estimated: >60 mL/min (ref >60)
Glucose, Bld: 86 mg/dL (ref 70–99)
Potassium: 3.2 mmol/L — ABNORMAL LOW (ref 3.5–5.1)
Sodium: 141 mmol/L (ref 135–145)

## 2023-07-26 LAB — CBC WITH DIFFERENTIAL/PLATELET
Abs Immature Granulocytes: 0.02 K/uL (ref 0.00–0.07)
Basophils Absolute: 0.1 K/uL (ref 0.0–0.1)
Basophils Relative: 1 %
Eosinophils Absolute: 0 K/uL (ref 0.0–0.5)
Eosinophils Relative: 0 %
HCT: 38.3 % (ref 36.0–46.0)
Hemoglobin: 12.9 g/dL (ref 12.0–15.0)
Immature Granulocytes: 0 %
Lymphocytes Relative: 32 %
Lymphs Abs: 2.6 K/uL (ref 0.7–4.0)
MCH: 32.2 pg (ref 26.0–34.0)
MCHC: 33.7 g/dL (ref 30.0–36.0)
MCV: 95.5 fL (ref 80.0–100.0)
Monocytes Absolute: 0.5 K/uL (ref 0.1–1.0)
Monocytes Relative: 7 %
Neutro Abs: 4.7 K/uL (ref 1.7–7.7)
Neutrophils Relative %: 60 %
Platelets: 285 K/uL (ref 150–400)
RBC: 4.01 MIL/uL (ref 3.87–5.11)
RDW: 12.5 % (ref 11.5–15.5)
WBC: 7.9 K/uL (ref 4.0–10.5)
nRBC: 0 % (ref 0.0–0.2)

## 2023-07-26 MED ORDER — METOCLOPRAMIDE HCL 5 MG/ML IJ SOLN
10.0000 mg | Freq: Once | INTRAMUSCULAR | Status: AC
  Administered 2023-07-26: 10 mg via INTRAVENOUS
  Filled 2023-07-26: qty 2

## 2023-07-26 MED ORDER — DIPHENHYDRAMINE HCL 50 MG/ML IJ SOLN
25.0000 mg | Freq: Once | INTRAMUSCULAR | Status: AC
  Administered 2023-07-26: 25 mg via INTRAVENOUS
  Filled 2023-07-26: qty 1

## 2023-07-26 MED ORDER — POTASSIUM CHLORIDE CRYS ER 20 MEQ PO TBCR
40.0000 meq | EXTENDED_RELEASE_TABLET | Freq: Once | ORAL | Status: AC
  Administered 2023-07-26: 40 meq via ORAL
  Filled 2023-07-26: qty 2

## 2023-07-26 MED ORDER — SODIUM CHLORIDE 0.9 % IV BOLUS
1000.0000 mL | Freq: Once | INTRAVENOUS | Status: AC
  Administered 2023-07-26: 1000 mL via INTRAVENOUS

## 2023-07-26 MED ORDER — DEXAMETHASONE SODIUM PHOSPHATE 10 MG/ML IJ SOLN
10.0000 mg | Freq: Once | INTRAMUSCULAR | Status: AC
Start: 2023-07-26 — End: 2023-07-26
  Administered 2023-07-26: 10 mg via INTRAVENOUS
  Filled 2023-07-26: qty 1

## 2023-07-26 NOTE — ED Notes (Signed)
 Pt d/c instructions, medications, and follow-up care reviewed with pt and family member. Pt verbalized understanding and had no further questions at time of d/c. Pt CA&Ox4, ambulatory, and in NAD at time of d/c

## 2023-07-26 NOTE — ED Triage Notes (Signed)
 Pt via pov from home with headache (fullness in head) and generalized facial numbness that started Monday evening. She has complicated medical hx that includes spinal problems. The concerning part is the headache is new. Pt a&o x 4; nad noted.

## 2023-07-26 NOTE — ED Provider Notes (Addendum)
 [place] [facility] AT [facility] Provider Note   CSN: [id number] Arrival date & time: 07/26/23  1754     Patient presents with: Headache and Numbness   Marissa [name] is a 58 y.o. female.   Patient was feeling fine and then until today.  Just about 4 hours ago started with pain on the top of her head that feels like a pressure.  And some pressure behind the eyes and some light sensitivity.  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  Patient says that normally she does not get headaches with her ocular migraines but the eye part reminds her of that.  She has got a lot of cervical problems at all baseline.  Denies any fevers nausea vomiting is also a little bit of numbness in both cheeks bilaterally.  Oxygen saturation 100% blood pressure 150/80 temp is 99.1 pulse is 94 respirations.  Patient also states that she has got a history of a pituitary tumor that has been stable.  As mentioned the Ehlers-Danlos syndrome ocular migraines.  Patient is never used tobacco products.  Patient nontoxic no acute distress       Prior to Admission medications   Medication Sig Start Date End Date Taking? Authorizing Provider  AMBULATORY NON FORMULARY MEDICATION Naltrexone 1mg /cc  4cc three times daily 02/14/23   Harvey Seltzer, MD  diazepam  (VALIUM ) 5 MG tablet 1 po tid prn muscle spasm 12/23/22   Harvey Seltzer, MD  Fexofenadine HCl (ALLEGRA ALLERGY PO) Allegra Allergy    [provider]  fluconazole  (DIFLUCAN ) 150 MG tablet Take 1 tablet (150 mg total) by mouth daily. 07/20/23   Harvey Seltzer, MD  hydrOXYzine  (ATARAX ) 10 MG tablet Take 1 tablet (10 mg total) by mouth 4 (four) times daily as needed. 01/07/21   Harvey Seltzer, MD  ivabradine  (CORLANOR) 5 MG TABS tablet Take 2.5 mg by mouth daily. Patient not taking: Reported on 07/25/2023 04/07/23   [provider]  levocetirizine (XYZAL) 5 MG tablet Take 5 mg by  mouth every evening.    [provider]  Multiple Vitamin (MULTIVITAMIN) capsule Take 1 capsule by mouth daily.    [provider]  norgestrel-ethinyl estradiol (CRYSELLE-28) 0.3-30 MG-MCG tablet Cryselle (28) 0.3 mg-30 mcg tablet  TAKE 1 TABLET BY MOUTH EVERY DAY CONTINUOUSLY    [provider]  predniSONE  (DELTASONE ) 20 MG tablet Take 1 tablet (20 mg total) by mouth 2 (two) times daily. 07/20/23   Harvey Seltzer, MD  SYMBICORT 80-4.5 MCG/ACT inhaler Inhale into the lungs. 11/09/21   [provider]  VENTOLIN HFA 108 (90 Base) MCG/ACT inhaler Inhale 2 puffs into the lungs every 4 (four) hours as needed. 05/28/21   [provider]  Vitamin D , Ergocalciferol , (DRISDOL ) 1.25 MG (50000 UNIT) CAPS capsule Take 1 capsule (50,000 Units total) by mouth every 7 (seven) days for 12 doses. 06/20/23 09/06/23  Theophilus Andrews, Tully GRADE, MD  XOLAIR  150 MG/ML prefilled syringe  06/27/18   [provider]  zolpidem  (AMBIEN ) 10 MG tablet TAKE 1/2-1 TABLETS BY MOUTH AT BEDTIME AS NEEDED 03/07/23   Harvey Seltzer, MD    Allergies: Doxepin , Duloxetine  hcl, and Macrobid [nitrofurantoin monohyd macro]    Review of Systems  Constitutional:  Negative for chills and fever.  HENT:  Negative for ear pain and sore throat.   Eyes:  Positive for photophobia. Negative for pain and visual disturbance.  Respiratory:  Negative for cough and shortness  of breath.   Cardiovascular:  Negative for chest pain and palpitations.  Gastrointestinal:  Negative for abdominal pain and vomiting.  Genitourinary:  Negative for dysuria and hematuria.  Musculoskeletal:  Positive for neck pain. Negative for arthralgias, back pain and neck stiffness.  Skin:  Negative for color change and rash.  Neurological:  Positive for headaches. Negative for seizures and syncope.  All other systems reviewed and are negative.   Updated Vital Signs BP 124/70   Pulse 72   Temp 99.1 F (37.3 C) (Oral)   Resp  16   Ht 1.486 m (4' 10.5)   Wt 42.6 kg   SpO2 100%   BMI 19.31 kg/m   Physical Exam Vitals and nursing note reviewed.  Constitutional:      General: She is not in acute distress.    Appearance: Normal appearance. She is well-developed. She is not ill-appearing or toxic-appearing.  HENT:     Head: Normocephalic and atraumatic.  Eyes:     Extraocular Movements: Extraocular movements intact.     Conjunctiva/sclera: Conjunctivae normal.     Pupils: Pupils are equal, round, and reactive to light.  Cardiovascular:     Rate and Rhythm: Normal rate and regular rhythm.     Heart sounds: No murmur heard. Pulmonary:     Effort: Pulmonary effort is normal. No respiratory distress.     Breath sounds: Normal breath sounds.  Abdominal:     Palpations: Abdomen is soft.     Tenderness: There is no abdominal tenderness.  Musculoskeletal:        General: No swelling.     Cervical back: Normal range of motion and neck supple. No rigidity.  Skin:    General: Skin is warm and dry.     Capillary Refill: Capillary refill takes less than 2 seconds.  Neurological:     General: No focal deficit present.     Mental Status: She is alert and oriented to person, place, and time.     Cranial Nerves: No cranial nerve deficit.     Sensory: No sensory deficit.     Motor: No weakness.  Psychiatric:        Mood and Affect: Mood normal.     (all labs ordered are listed, but only abnormal results are displayed) Labs Reviewed  BASIC METABOLIC PANEL WITH GFR - Abnormal; Notable for the following components:      Result Value   Potassium 3.2 (*)    CO2 21 (*)    All other components within normal limits  CBC WITH DIFFERENTIAL/PLATELET    EKG: None  Radiology: CT Head Wo Contrast Result Date: 07/26/2023 CLINICAL DATA:  Headache EXAM: CT HEAD WITHOUT CONTRAST TECHNIQUE: Contiguous axial images were obtained from the base of the skull through the vertex without intravenous contrast. RADIATION DOSE  REDUCTION: This exam was performed according to the departmental dose-optimization program which includes automated exposure control, adjustment of the mA and/or kV according to patient size and/or use of iterative reconstruction technique. COMPARISON:  MRI 11/26/2016 FINDINGS: Brain: No evidence of acute infarction, hemorrhage, hydrocephalus, extra-axial collection or mass lesion/mass effect. Vascular: No hyperdense vessel or unexpected calcification. Skull: Normal. Negative for fracture or focal lesion. Sinuses/Orbits: No acute finding. Other: None IMPRESSION: Negative non contrasted CT appearance of the brain. Electronically Signed   By: Luke Bun M.D.   On: 07/26/2023 20:00     Procedures   Medications Ordered in the ED  potassium chloride  SA (KLOR-CON  M) CR tablet 40 mEq (  has no administration in time range)  sodium chloride  0.9 % bolus 1,000 mL (1,000 mLs Intravenous New Bag/Given 07/26/23 2000)  dexamethasone  (DECADRON ) injection 10 mg (10 mg Intravenous Given 07/26/23 2000)  diphenhydrAMINE  (BENADRYL ) injection 25 mg (25 mg Intravenous Given 07/26/23 2001)  metoCLOPramide  (REGLAN ) injection 10 mg (10 mg Intravenous Given 07/26/23 2000)                                    Medical Decision Making Amount and/or Complexity of Data Reviewed Labs: ordered. Radiology: ordered.  Risk Prescription drug management.   Patient nontoxic no acute distress.  Possible symptoms could be be a migraine equivalent particular with her history of ocular migraines.  Does have some of the visual symptoms related to that.  Will go ahead and try migraine cocktail liter of fluid Decadron  Reglan  and Benadryl .  Will get head CT.  Get basic labs.  CBC white count 7.9 reassuring hemoglobin 12.9 platelets 285 patient metabolic panel is pending.  CT head results are back.  CT negative noncontrast CT appearance of the brain.  Nothing abnormal.  No evidence of any acute infarction hemorrhage hydrocephalus or  collection of mass lesion or mass effect.  Orbits and sinuses are normal.  No specific mention of the pituitary.  Patient feeling better with the migraine cocktail but all symptoms have not resolved but the numbness is improved significantly.  Headaches improving.  Patient said base metabolic panel was pending.  Potassium is 3.2 so we will give some oral potassium here renal function is normal.  Patient stable for discharge home with follow-up with her doctors if symptoms do not resolve or returning for any new or worse symptoms  Final diagnoses:  Acute intractable headache, unspecified headache type  Hypokalemia    ED Discharge Orders     None          Geraldene Hamilton, MD 07/26/23 2027    Geraldene Hamilton, MD 07/26/23 2056

## 2023-07-26 NOTE — Discharge Instructions (Addendum)
 Head CT labs without any acute findings.  Potassium was a little low at 3.2.  Given some potassium supplement here.  And follow-up with your doctors.  Certainly return for any new or worse symptoms.  Definitely would consider MRI if symptoms do not resolve.  Rest at home in a dark room.  Would recommend being off work tomorrow and the next day.

## 2023-07-27 NOTE — Unmapped (Signed)
 [facility] Refill Coordination Note    Natasha Copeland, DOB: [birth year]  Phone: [phone] (home)       All above HIPAA information was verified with patient.         07/27/2023     1:03 PM   Specialty Rx Medication Refill Questionnaire   Which Medications would you like refilled and shipped? Xolair , No supply on hand   Please list all current allergies: Doxipen, macrobid   Have you missed any doses in the last 30 days? No   Have you had any changes to your medication(s) since your last refill? No   How much of each medication do you have remaining at home? (eg. number of tablets, injections, etc.) 0   If receiving an injectable medication, next injection date is 08/07/2023   Have you experienced any side effects in the last 30 days? No   Please enter the full address (street address, city, state, zip code) where you would like your medication(s) to be delivered to. [address], [place], [place] [address]   Please specify on which day you would like your medication(s) to arrive. Note: if you need your medication(s) within 3 days, please call the pharmacy to schedule your order at [phone]  08/02/2023   Has your insurance changed since your last refill? No   Would you like a pharmacist to call you to discuss your medication(s)? No   Do you require a signature for your package? (Note: if we are billing Medicare Part B or your order contains a controlled substance, we will require a signature) No   I have been provided my out of pocket cost for my medication and approve the pharmacy to charge the amount to my credit card on file. Yes         Completed refill call assessment today to schedule patient's medication shipment from the [facility] [phone]).  All relevant notes have been reviewed.       Confirmed patient received a Conservation officer, historic buildings and a Surveyor, mining with first shipment. The patient will receive a drug information handout for each medication shipped and additional FDA Medication Guides as required.         REFERRAL TO PHARMACIST     Referral to the pharmacist: Not needed      [facility]     Shipping address confirmed in Epic.     Delivery Scheduled: Yes, Expected medication delivery date: 08/02/23.     Medication will be delivered via UPS to the prescription address in Epic WAM.    Natasha Copeland   [facility] Specialty Technician

## 2023-07-28 ENCOUNTER — Telehealth: Payer: Self-pay

## 2023-07-28 ENCOUNTER — Ambulatory Visit (INDEPENDENT_AMBULATORY_CARE_PROVIDER_SITE_OTHER): Admitting: Family Medicine

## 2023-07-28 ENCOUNTER — Encounter: Payer: Self-pay | Admitting: Family Medicine

## 2023-07-28 VITALS — BP 120/64 | HR 85 | Temp 98.5°F | Ht 58.5 in | Wt 93.3 lb

## 2023-07-28 DIAGNOSIS — H6123 Impacted cerumen, bilateral: Secondary | ICD-10-CM

## 2023-07-28 DIAGNOSIS — R519 Headache, unspecified: Secondary | ICD-10-CM | POA: Diagnosis not present

## 2023-07-28 NOTE — Patient Instructions (Signed)
 Debrox ear drops -- 2-3 drops in each ear 2-3 times per week to help with wax buildup  Let us  know if symptoms are persistent-- may need referral to neurology or ENT

## 2023-07-28 NOTE — Transitions of Care (Post Inpatient/ED Visit) (Signed)
   07/28/2023  Name: Marissa [name] MRN: [id number] DOB: 16-Feb-1965  Today's TOC FU Call Status:    Attempted to reach the patient regarding the most recent Inpatient/ED visit.  Follow Up Plan: Additional outreach attempts will be made to reach the patient to complete the Transitions of Care (Post Inpatient/ED visit) call.   Signature [employer]

## 2023-07-28 NOTE — Progress Notes (Signed)
 Established Patient Office Visit  Subjective   Patient ID: Marissa [name], female    DOB: [birth year]  Age: 58 y.o. MRN: [id number]  Chief Complaint  Patient presents with   Hospitalization Follow-up    Patient presents for ER follow up, complains of recurrent head fullness and pressure worsening x1 day, ears also feel full    Pt states she was seen in the ED about 2 days ago for the intractable headache. She has  a complicated history of EDS, states that she had a CT of the head which was unremarkable, sinuses were clear at the time. States that they migraine cocktail they gave her did work for her headache, however once it wore of her symptoms have slowly started returning. States that her head just feels full as well as her ears, no fever or chills, no coughing, no other upper respiratory symptoms. Hearing is ok, no muffling.  Has a history of ocular migraines and chronic neck problems, but usually doesn't get pain with these    Current Outpatient Medications  Medication Instructions   AMBULATORY NON FORMULARY MEDICATION Naltrexone 1mg /cc  4cc three times daily   diazepam  (VALIUM ) 5 MG tablet 1 po tid prn muscle spasm   Fexofenadine HCl (ALLEGRA ALLERGY PO) Allegra Allergy   hydrOXYzine  (ATARAX ) 10 mg, Oral, 4 times daily PRN   levocetirizine (XYZAL) 5 mg, Every evening   Multiple Vitamin (MULTIVITAMIN) capsule 1 capsule, Daily   norgestrel-ethinyl estradiol (CRYSELLE-28) 0.3-30 MG-MCG tablet Cryselle (28) 0.3 mg-30 mcg tablet  TAKE 1 TABLET BY MOUTH EVERY DAY CONTINUOUSLY   SYMBICORT 80-4.5 MCG/ACT inhaler Inhale into the lungs.   VENTOLIN HFA 108 (90 Base) MCG/ACT inhaler 2 puffs, Every 4 hours PRN   Vitamin D  (Ergocalciferol ) (DRISDOL ) 50,000 Units, Oral, Every 7 days   XOLAIR  150 MG/ML prefilled syringe No dose, route, or frequency recorded.   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   POTS (postural orthostatic tachycardia syndrome) 08/29/2017   Chronic pain syndrome 08/29/2017   Chronic insomnia 08/29/2017   Wrist pain, acute 06/02/2016   Enthesopathy of right shoulder 01/28/2016   Right shoulder pain 01/28/2016   Family history of early CAD 11/30/2015   Cervical disc disorder with radiculopathy of cervical region 05/13/2014   Chronic right SI joint pain 08/28/2013   Hip pain, right 06/25/2013   Elbow pain, right 02/13/2013   Bunionette 01/07/2013   Ehlers-Danlos disease 11/08/2012   Unspecified visual disturbance 04/19/2012   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  Eyes:  Negative for blurred vision and double vision.      Objective:     BP 120/64   Pulse 85   Temp 98.5 F (36.9 C) (Oral)   Ht 4' 10.5 (1.486 m)   Wt 93 lb 4.8 oz (42.3 kg)   SpO2 99%   BMI 19.17 kg/m  Physical Exam Vitals reviewed.  Constitutional:      Appearance: Normal appearance. She is normal weight.  HENT:     Right Ear: There is impacted cerumen.     Left Ear: There is impacted cerumen.     Nose: No rhinorrhea.  Eyes:     Conjunctiva/sclera: Conjunctivae normal.     Pupils: Pupils are equal, round, and reactive to light.  Cardiovascular:     Rate and Rhythm: Normal rate and regular rhythm.     Heart sounds: Normal heart sounds. No murmur heard. Pulmonary:     Effort: Pulmonary effort is normal.     Breath sounds: Normal  breath sounds. No wheezing.  Musculoskeletal:     Cervical back: Normal range of motion.  Lymphadenopathy:     Cervical: No cervical adenopathy.  Neurological:     Mental Status: She is alert.    Impacted cerumen removal procedure note:  After verbal consent was obtained the patient was irrigated with mixture of hydrogen peroxide and warm water.  The right ear was successfully irrigated, however The patient could not tolerate the flushing procedure and so the procedure was discontinued due to patient discomfort.   No results found for any visits on 07/28/23.    The 10-year ASCVD risk score (Arnett DK, et al., 2019) is: 1.4%    Assessment & Plan:  Bilateral impacted cerumen Patient could not tolerate the procedure due to discomfort. I recommended she use Debrox ear drops  in each ear at home to help encourage drainage of the  wax. RTC is symptoms worsen or do not improve.  Acute nonintractable headache, unspecified headache type  Reviewed records from the ED visit as well as labs and imaging. Since her headache is improved I advised to continue monitoring her symptoms at home.  If the sx are persistent then she should follow upwith her PCP, she may need referral to the headache clinic or a neurologist.   Return if symptoms worsen or fail to improve.    Heron CHRISTELLA Sharper, MD

## 2023-08-01 MED FILL — XOLAIR 300 MG/2 ML SUBCUTANEOUS SYRINGE: SUBCUTANEOUS | 28 days supply | Qty: 2 | Fill #2

## 2023-08-03 ENCOUNTER — Encounter: Payer: Self-pay | Admitting: Family Medicine

## 2023-08-03 ENCOUNTER — Ambulatory Visit (INDEPENDENT_AMBULATORY_CARE_PROVIDER_SITE_OTHER): Admitting: Family Medicine

## 2023-08-03 VITALS — BP 104/64 | HR 101 | Temp 98.9°F | Ht <= 58 in | Wt 92.0 lb

## 2023-08-03 DIAGNOSIS — H6122 Impacted cerumen, left ear: Secondary | ICD-10-CM | POA: Diagnosis not present

## 2023-08-03 NOTE — Progress Notes (Signed)
   Acute Office Visit   Subjective:  Patient ID: Marissa [name], female    DOB: 07-Jul-1965, 58 y.o.   MRN: [id number]  Chief Complaint  Patient presents with   Ear Fullness    Ear Fullness    Patient is here for a left ear lavage. She was seen with Dr. Heddie on 07/11 for bilateral impacted cerumen. She was advised to use Debrox ear drops, 2-3 drops in each ear 2-3 times per week to help. She reports she had tried medication, but still felt some fullness in left ear, like she had not got all the cerumen out.  ROS See HPI above      Objective:   BP 104/64   Pulse (!) 101   Temp 98.9 F (37.2 C) (Oral)   Ht 4' 10 (1.473 m)   Wt 92 lb (41.7 kg)   SpO2 98%   BMI 19.23 kg/m    Physical Exam Vitals reviewed.  Constitutional:      General: She is not in acute distress.    Appearance: Normal appearance. She is not ill-appearing, toxic-appearing or diaphoretic.  HENT:     Head: Normocephalic and atraumatic.     Right Ear: Tympanic membrane, ear canal and external ear normal. There is no impacted cerumen.     Left Ear: There is impacted cerumen.  Eyes:     General:        Right eye: No discharge.        Left eye: No discharge.     Conjunctiva/sclera: Conjunctivae normal.  Cardiovascular:     Rate and Rhythm: Tachycardia present.  Pulmonary:     Effort: Pulmonary effort is normal. No respiratory distress.  Musculoskeletal:        General: Normal range of motion.  Skin:    General: Skin is warm and dry.  Neurological:     General: No focal deficit present.     Mental Status: She is alert and oriented to person, place, and time. Mental status is at baseline.  Psychiatric:        Mood and Affect: Mood normal.        Behavior: Behavior normal.        Thought Content: Thought content normal.        Judgment: Judgment normal.   PRE-PROCEDURE EXAM: Left TM cannot be visualized due to partial occlusion/impaction of the ear canal. PROCEDURE INDICATION: Remove wax to  visualize ear drum & relieve discomfort CONSENT:  Verbal  PROCEDURE NOTE: Left ear: The CMA, Kyleigh Sigmon, irrigated left ear with warm water and ear drops to remove the wax. 100% of the wax was removed.  POST- PROCEDURE EXAM: TM successfully visualized. TM with no erythema. The patient tolerated the procedure well.       Assessment & Plan:  Impacted cerumen of left ear  -Left ear lavage completed today with success.  -Recommend to use Debrox ear drops every 3-6 months to cleanse ear canals.   Alainah Phang, NP

## 2023-08-03 NOTE — Patient Instructions (Signed)
-  It was nice to meet you today and provide care. -Left ear lavage completed today with success.  -Recommend to use Debrox ear drops every 3-6 months to cleanse ear canals.

## 2023-08-17 ENCOUNTER — Encounter: Payer: Self-pay | Admitting: Internal Medicine

## 2023-08-17 ENCOUNTER — Ambulatory Visit (INDEPENDENT_AMBULATORY_CARE_PROVIDER_SITE_OTHER): Admitting: Internal Medicine

## 2023-08-17 VITALS — BP 110/70 | HR 105 | Temp 98.6°F | Wt 92.1 lb

## 2023-08-17 DIAGNOSIS — K5900 Constipation, unspecified: Secondary | ICD-10-CM | POA: Diagnosis not present

## 2023-08-17 NOTE — Progress Notes (Signed)
 Established Patient Office Visit     CC/Reason for Visit: Bowel issues  HPI: Marissa [name] is a 58 y.o. female who is coming in today for the above mentioned reasons. Past Medical History is significant for: Erler's Danlos.  For the last couple weeks she has had anorexia, mild weight loss some nausea and constipation.  She feels that when she does have a bowel movement it is a small amount and it is thin calibered.  No recent medication changes.  She has a colonoscopy scheduled for September.   Past Medical/Surgical History: Past Medical History:  Diagnosis Date   Allergy    Anemia 10/24   Thought to be related to NSAID   Arthritis    Asthma    Ehlers-Danlos syndrome    GERD (gastroesophageal reflux disease)    History of pituitary tumor    Ocular migraine     Past Surgical History:  Procedure Laterality Date   KNEE SURGERY N/A 01/17/2010   SHOULDER SURGERY N/A 01/18/2007   SPINE SURGERY     C5-6 fusion   UTERINE FIBROID SURGERY N/A     Social History:  reports that she has never smoked. She has never used smokeless tobacco. She reports that she does not drink alcohol and does not use drugs.  Allergies: Allergies  Allergen Reactions   Doxepin  Rash    Reaction of Erythema multiforme   Duloxetine  Hcl Other (See Comments)   Macrobid [Nitrofurantoin Monohyd Macro]     Family History:  Family History  Problem Relation Age of Onset   Arthritis Mother    Atrial fibrillation Mother    Obesity Mother    Varicose Veins Mother    Arthritis Father    Diabetes Father    Heart disease Father    Hypertension Father    Heart failure Father    Obesity Father    Arthritis Sister    Asthma Sister    Diabetes Sister    Obesity Sister    Hearing loss Sister    Breast cancer Neg Hx      Current Outpatient Medications:    AMBULATORY NON FORMULARY MEDICATION, Naltrexone 1mg /cc  4cc three times daily, Disp: 360 mL, Rfl: 4   diazepam  (VALIUM ) 5 MG tablet, 1 po tid  prn muscle spasm, Disp: 90 tablet, Rfl: 3   Fexofenadine HCl (ALLEGRA ALLERGY PO), Allegra Allergy, Disp: , Rfl:    hydrOXYzine  (ATARAX ) 10 MG tablet, Take 1 tablet (10 mg total) by mouth 4 (four) times daily as needed., Disp: 90 tablet, Rfl: 1   levocetirizine (XYZAL) 5 MG tablet, Take 5 mg by mouth every evening., Disp: , Rfl:    Multiple Vitamin (MULTIVITAMIN) capsule, Take 1 capsule by mouth daily., Disp: , Rfl:    norgestrel-ethinyl estradiol (CRYSELLE-28) 0.3-30 MG-MCG tablet, Cryselle (28) 0.3 mg-30 mcg tablet  TAKE 1 TABLET BY MOUTH EVERY DAY CONTINUOUSLY, Disp: , Rfl:    SYMBICORT 80-4.5 MCG/ACT inhaler, Inhale into the lungs., Disp: , Rfl:    VENTOLIN HFA 108 (90 Base) MCG/ACT inhaler, Inhale 2 puffs into the lungs every 4 (four) hours as needed., Disp: , Rfl:    Vitamin D , Ergocalciferol , (DRISDOL ) 1.25 MG (50000 UNIT) CAPS capsule, Take 1 capsule (50,000 Units total) by mouth every 7 (seven) days for 12 doses., Disp: 12 capsule, Rfl: 0   XOLAIR  150 MG/ML prefilled syringe, , Disp: , Rfl:    zolpidem  (AMBIEN ) 10 MG tablet, TAKE 1/2-1 TABLETS BY MOUTH AT BEDTIME AS NEEDED,  Disp: 30 tablet, Rfl: 3  Review of Systems:  Negative unless indicated in HPI.   Physical Exam: Vitals:   08/17/23 1125  BP: 110/70  Pulse: (!) 105  Temp: 98.6 F (37 C)  TempSrc: Oral  SpO2: 100%  Weight: 92 lb 1.6 oz (41.8 kg)    Body mass index is 19.25 kg/m.   Physical Exam Abdominal:     General: Abdomen is flat. Bowel sounds are normal. There is no distension.     Palpations: Abdomen is soft.     Tenderness: There is no abdominal tenderness.      Impression and Plan:  Constipation, unspecified constipation type   - We discussed stool softeners and MiraLAX.  If no improvement she can also use the colonoscopy prep she already has at home.  Time spent:22 minutes reviewing chart, interviewing and examining patient and formulating plan of care.     Tully Theophilus Andrews, MD [place]  [facility] at [facility]

## 2023-08-22 DIAGNOSIS — M5412 Radiculopathy, cervical region: Secondary | ICD-10-CM | POA: Diagnosis not present

## 2023-08-23 NOTE — Unmapped (Signed)
 [facility] Special[facility] and Home Delivery Pharmacy Refill Coordination Note    Natasha Copeland, DOB: April 27, 1965  Phone: [phone] (home)       All above HIPAA information was verified with patient.         08/22/2023     4:02 PM   Specialty Rx Medication Refill Questionnaire   Which Medications would you like refilled and shipped? Xolair , 0 day supply on hand   Please list all current allergies: Doxipen   Have you missed any doses in the last 30 days? No   Have you had any changes to your medication(s) since your last refill? No   How much of each medication do you have remaining at home? (eg. number of tablets, injections, etc.) 0   If receiving an injectable medication, next injection date is 09/04/2023   Have you experienced any side effects in the last 30 days? No   Please enter the full address (street address, city, state, zip code) where you would like your medication(s) to be delivered to. [address], [place], [place] [address]   Please specify on which day you would like your medication(s) to arrive. Note: if you need your medication(s) within 3 days, please call the pharmacy to schedule your order at [phone]  08/31/2023   Has your insurance changed since your last refill? No   Would you like a pharmacist to call you to discuss your medication(s)? No   Do you require a signature for your package? (Note: if we are billing Medicare Part B or your order contains a controlled substance, we will require a signature) No   I have been provided my out of pocket cost for my medication and approve the pharmacy to charge the amount to my credit card on file. Yes         Completed refill call assessment today to schedule patient's medication shipment from the [facility] and [facility] Delivery Pharm[facility] [phone]).  All relevant notes have been reviewed.       Confirmed patient received a Conservation officer, historic buildings and a Surveyor, mining with first shipment. The patient will receive a drug information handout for each medication shipped and additional FDA Medication Guides as required.         REFERRAL TO PHARMACIST     Referral to the pharmacist: Not needed      [facility]     Shipping address confirmed in Epic.     Delivery Scheduled: Yes, Expected medication delivery date: 08/31/23.     Medication will be delivered via UPS to the prescription address in Epic WAM.    Kyra Myron   [facility] and Home Delivery Pharm[facility] Specialty Technician

## 2023-08-29 DIAGNOSIS — K5904 Chronic idiopathic constipation: Secondary | ICD-10-CM | POA: Diagnosis not present

## 2023-08-29 DIAGNOSIS — R140 Abdominal distension (gaseous): Secondary | ICD-10-CM | POA: Diagnosis not present

## 2023-08-29 DIAGNOSIS — G90A Postural orthostatic tachycardia syndrome (POTS): Secondary | ICD-10-CM | POA: Diagnosis not present

## 2023-08-29 DIAGNOSIS — Z800 Family history of malignant neoplasm of digestive organs: Secondary | ICD-10-CM | POA: Diagnosis not present

## 2023-08-30 DIAGNOSIS — N760 Acute vaginitis: Secondary | ICD-10-CM | POA: Diagnosis not present

## 2023-08-30 DIAGNOSIS — N898 Other specified noninflammatory disorders of vagina: Secondary | ICD-10-CM | POA: Diagnosis not present

## 2023-08-30 MED FILL — XOLAIR 300 MG/2 ML SUBCUTANEOUS SYRINGE: SUBCUTANEOUS | 28 days supply | Qty: 2 | Fill #3

## 2023-09-05 ENCOUNTER — Other Ambulatory Visit: Payer: Self-pay | Admitting: Internal Medicine

## 2023-09-05 DIAGNOSIS — E559 Vitamin D deficiency, unspecified: Secondary | ICD-10-CM

## 2023-09-12 ENCOUNTER — Other Ambulatory Visit: Payer: Self-pay | Admitting: Gastroenterology

## 2023-09-12 ENCOUNTER — Ambulatory Visit: Payer: Self-pay | Admitting: Internal Medicine

## 2023-09-12 ENCOUNTER — Other Ambulatory Visit (INDEPENDENT_AMBULATORY_CARE_PROVIDER_SITE_OTHER)

## 2023-09-12 DIAGNOSIS — R109 Unspecified abdominal pain: Secondary | ICD-10-CM | POA: Diagnosis not present

## 2023-09-12 DIAGNOSIS — K581 Irritable bowel syndrome with constipation: Secondary | ICD-10-CM | POA: Diagnosis not present

## 2023-09-12 DIAGNOSIS — G90A Postural orthostatic tachycardia syndrome (POTS): Secondary | ICD-10-CM | POA: Diagnosis not present

## 2023-09-12 DIAGNOSIS — E559 Vitamin D deficiency, unspecified: Secondary | ICD-10-CM | POA: Diagnosis not present

## 2023-09-12 DIAGNOSIS — R1033 Periumbilical pain: Secondary | ICD-10-CM

## 2023-09-12 LAB — VITAMIN D 25 HYDROXY (VIT D DEFICIENCY, FRACTURES): VITD: 61.69 ng/mL (ref 30.00–100.00)

## 2023-09-14 DIAGNOSIS — M5412 Radiculopathy, cervical region: Secondary | ICD-10-CM | POA: Diagnosis not present

## 2023-09-20 ENCOUNTER — Ambulatory Visit (INDEPENDENT_AMBULATORY_CARE_PROVIDER_SITE_OTHER): Admitting: Sports Medicine

## 2023-09-20 ENCOUNTER — Ambulatory Visit
Admission: RE | Admit: 2023-09-20 | Discharge: 2023-09-20 | Disposition: A | Discharge status: Home / Self Care | Source: Ambulatory Visit | Attending: Gastroenterology | Admitting: Gastroenterology

## 2023-09-20 VITALS — BP 118/72 | Ht <= 58 in | Wt 90.0 lb

## 2023-09-20 DIAGNOSIS — R1033 Periumbilical pain: Secondary | ICD-10-CM | POA: Diagnosis not present

## 2023-09-20 DIAGNOSIS — M5010 Cervical disc disorder with radiculopathy, unspecified cervical region: Secondary | ICD-10-CM

## 2023-09-20 DIAGNOSIS — D8940 Mast cell activation, unspecified: Secondary | ICD-10-CM | POA: Diagnosis not present

## 2023-09-20 DIAGNOSIS — Q796 Ehlers-Danlos syndrome, unspecified: Secondary | ICD-10-CM

## 2023-09-20 MED ORDER — IOPAMIDOL (ISOVUE-370) INJECTION 76%
70.0000 mL | Freq: Once | INTRAVENOUS | Status: AC | PRN
  Administered 2023-09-20: 70 mL via INTRAVENOUS

## 2023-09-20 NOTE — Progress Notes (Signed)
 Chief complaint Ehlers-Danlos syndrome complications  Patient was brought back today for more evaluation because of my concerns that a lot of her gastrointestinal symptoms, skin symptoms and overall musculoskeletal symptoms probably relate to adverse effects of her Ehlers-Danlos syndrome.  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  She has had long-term dermatographia and sensitive skin.  Currently she is on Xyzal and normally takes Allegra and has been taking Pepcid.  She is not on cromolyn or on a leukotriene antagonist.  These have been manage by Dr. Dasie who unfortunately has died.  Recently her skin has been dry and sensitive.  She is having dryness in her mouth.  Vaginal mucosa.  However more significant has been worsening of GI symptoms.  This has been associated with almost a 10 pound weight loss.  Over 50% of the people with hypermobility Ehlers-Danlos who have mast cell hypersensitivity also have related GI problems and I was suspicious that this could be the situation for her.  This is possibly worse because she had stopped the Allegra because of her dryness.  She gets significant constipation and was started on a new her medication by Dr. Kristie but is still requiring some stimulant laxatives at times.  She is not on a 5-HT-4 agonist for dysmobility associated with the Ehlers-Danlos.  She had an abdominal CT today and is awaiting other GI tests by Dr. Kristie.  In addition she has had worsening pain radiating from her neck into her right arm.  This was associated with some changes on her recent cervical MRI showing a right paracentral disc protrusion at C6-7.  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  Medications have not effectively relieve this so last week she had a cervical cord close steroid injection under guidance by Dr. Ibazebo.  She has not  had much relief of symptoms in the 4 days since this injection.  Pain into the right trapezius and right upper arm down to her elbow is pretty much a daily occurrence and worsens when she has a hard workday.  Even more significant GI bloating and discomfort are troublesome almost every day and make it difficult to have a bowel movement more frequently than every 3 days.  She had a little more success with stimulant laxatives.  Those changes have destroyed her appetite for she has had significant weight loss.  Physical exam Pleasant female who looks depressed and uncomfortable BP 118/72   Ht 4' 10 (1.473 m)   Wt 90 lb (40.8 kg)   BMI 18.81 kg/m   Neck range of motion is full today.  She does not get much exacerbation of symptoms into her right arm by rotation of the neck to the right or back extension or flexion. Compared to her last exam before her cortisone injection in her neck her reflexes are now 2+ on the right wrist they were diminished before that time.  Strength is still preserved.  Skin reveals flushing of both cheeks.  There is dryness in her mouth and some geographic changes on the tongue and the gums.  She has mild dermatographia.  Abdomen is soft and nontender.  I did not detect any hepatosplenomegaly.

## 2023-09-20 NOTE — Assessment & Plan Note (Signed)
 I think her overall Ehlers-Danlos syndrome is unstable at this time with a combination of musculoskeletal pain, gastrointestinal dysfunction that may be related to her mast cell sensitivity issues.  In addition she has weight loss, poor sleep, more stress particularly with trying to work while having significant pain.  Inability to do her normal exercise regimen which in the past gave her symptomatic relief and better energy.  We are looking at medication options to change and improve her symptoms.  For the meantime we are holding her low-dose naltrexone treatment because her pain is more specific to the neck and radicular patterns.  I spent 1 hour face-to-face with the patient and documenting the various medical conditions associated with her Ehlers-Danlos.

## 2023-09-20 NOTE — Assessment & Plan Note (Signed)
 Unfortunately she has not had much symptomatic relief from her corticosteroid injection into her right cervical spine to date.  I encouraged her to go ahead and use Valium  for the muscle spasm and add some Tylenol  for pain and see if she can get some pain relief from the strategies.

## 2023-09-20 NOTE — Assessment & Plan Note (Signed)
 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  If so the type of medications she is taking would probably be ineffective and so far that seems to be the case. And hypermobility Ehlers-Danlos syndrome the most common medication used with associated mast cell activation is Gastrocrom.  For the motility issues Procalopride has been the most successful of the 5-HT 4 agonist.  Her abdominal CT scan today came back as unremarkable.  Once she discusses her other tests with Dr. Kristie her gastroenterologist I hope they will discuss whether the medicine changes in order.

## 2023-09-22 NOTE — Unmapped (Signed)
 [facility] Special[facility] and Home Delivery Pharmacy Refill Coordination Note    Natasha Copeland, DOB: 1965/10/22  Phone: [phone] (home)       All above HIPAA information was verified with patient.         09/22/2023     6:05 AM   Specialty Rx Medication Refill Questionnaire   Which Medications would you like refilled and shipped? Xolair    Please list all current allergies: Doxipen   Have you missed any doses in the last 30 days? No   Have you had any changes to your medication(s) since your last refill? No   How much of each medication do you have remaining at home? (eg. number of tablets, injections, etc.) 0   If receiving an injectable medication, next injection date is 10/02/2023   Have you experienced any side effects in the last 30 days? No   Please enter the full address (street address, city, state, zip code) where you would like your medication(s) to be delivered to. [address] [place], [place] [address]   Please specify on which day you would like your medication(s) to arrive. Note: if you need your medication(s) within 3 days, please call the pharmacy to schedule your order at [phone]  09/28/2023   Has your insurance changed since your last refill? No   Would you like a pharmacist to call you to discuss your medication(s)? No   Do you require a signature for your package? (Note: if we are billing Medicare Part B or your order contains a controlled substance, we will require a signature) No   I have been provided my out of pocket cost for my medication and approve the pharmacy to charge the amount to my credit card on file. Yes         Completed refill call assessment today to schedule patient's medication shipment from the [facility] and Home Delivery Pharmacy [phone]).  All relevant notes have been reviewed.       Confirmed patient received a Conservation officer, historic buildings and a Surveyor, mining with first shipment. The patient will receive a drug information handout for each medication shipped and additional FDA Medication Guides as required.         REFERRAL TO PHARMACIST     Referral to the pharmacist: Not needed      [facility]     Shipping address confirmed in Epic.     Delivery Scheduled: Yes, Expected medication delivery date: 09/28/23.     Medication will be delivered via UPS to the prescription address in Epic WAM.    Kyra Myron   [facility] Special[facility] and Home Delivery Pharmacy Specialty Technician

## 2023-09-26 DIAGNOSIS — R682 Dry mouth, unspecified: Secondary | ICD-10-CM | POA: Diagnosis not present

## 2023-09-26 DIAGNOSIS — R21 Rash and other nonspecific skin eruption: Secondary | ICD-10-CM | POA: Diagnosis not present

## 2023-09-26 DIAGNOSIS — J4520 Mild intermittent asthma, uncomplicated: Secondary | ICD-10-CM | POA: Diagnosis not present

## 2023-09-26 DIAGNOSIS — M791 Myalgia, unspecified site: Secondary | ICD-10-CM | POA: Diagnosis not present

## 2023-09-26 MED ORDER — XOLAIR 300 MG/2 ML SUBCUTANEOUS AUTO-INJECTOR
SUBCUTANEOUS | 6 refills | 28.00000 days
Start: 2023-09-26 — End: ?

## 2023-09-27 DIAGNOSIS — M791 Myalgia, unspecified site: Secondary | ICD-10-CM | POA: Diagnosis not present

## 2023-09-27 MED FILL — XOLAIR 300 MG/2 ML SUBCUTANEOUS SYRINGE: SUBCUTANEOUS | 28 days supply | Qty: 2 | Fill #4

## 2023-10-04 DIAGNOSIS — Z1231 Encounter for screening mammogram for malignant neoplasm of breast: Secondary | ICD-10-CM | POA: Diagnosis not present

## 2023-10-04 LAB — HM MAMMOGRAPHY

## 2023-10-06 DIAGNOSIS — K635 Polyp of colon: Secondary | ICD-10-CM | POA: Diagnosis not present

## 2023-10-06 DIAGNOSIS — K2951 Unspecified chronic gastritis with bleeding: Secondary | ICD-10-CM | POA: Diagnosis not present

## 2023-10-06 DIAGNOSIS — K2950 Unspecified chronic gastritis without bleeding: Secondary | ICD-10-CM | POA: Diagnosis not present

## 2023-10-06 DIAGNOSIS — R1013 Epigastric pain: Secondary | ICD-10-CM | POA: Diagnosis not present

## 2023-10-06 DIAGNOSIS — Q438 Other specified congenital malformations of intestine: Secondary | ICD-10-CM | POA: Diagnosis not present

## 2023-10-06 DIAGNOSIS — Z1211 Encounter for screening for malignant neoplasm of colon: Secondary | ICD-10-CM | POA: Diagnosis not present

## 2023-10-06 DIAGNOSIS — K319 Disease of stomach and duodenum, unspecified: Secondary | ICD-10-CM | POA: Diagnosis not present

## 2023-10-09 DIAGNOSIS — M5412 Radiculopathy, cervical region: Secondary | ICD-10-CM | POA: Diagnosis not present

## 2023-10-10 ENCOUNTER — Encounter: Payer: Self-pay | Admitting: Internal Medicine

## 2023-10-11 ENCOUNTER — Telehealth: Payer: Self-pay | Admitting: Internal Medicine

## 2023-10-11 NOTE — Telephone Encounter (Signed)
 Pt is asking about a referral sent from her pcp. I could not see it but told her you would give her a call. Betzabe [phone]

## 2023-10-12 ENCOUNTER — Ambulatory Visit: Admitting: Cardiology

## 2023-10-17 ENCOUNTER — Ambulatory Visit (INDEPENDENT_AMBULATORY_CARE_PROVIDER_SITE_OTHER): Admitting: Sports Medicine

## 2023-10-17 VITALS — Ht <= 58 in | Wt 90.0 lb

## 2023-10-17 DIAGNOSIS — M5010 Cervical disc disorder with radiculopathy, unspecified cervical region: Secondary | ICD-10-CM | POA: Diagnosis not present

## 2023-10-17 DIAGNOSIS — Q796 Ehlers-Danlos syndrome, unspecified: Secondary | ICD-10-CM | POA: Diagnosis not present

## 2023-10-17 DIAGNOSIS — M792 Neuralgia and neuritis, unspecified: Secondary | ICD-10-CM | POA: Diagnosis not present

## 2023-10-17 NOTE — Progress Notes (Signed)
 Follow-up of Ehlers-Danlos syndrome issues  Patient has had a very good response to the cervical injection with much less spasm of her right trapezius and less radicular pain into her arm This seems to return almost to her baseline  GI issues have improved dramatically since starting on prucalopride 2 mg daily She has gained several pounds Constipation and bloating has largely resolved She feels like she can eat again without discomfort  Mast cell hypersensitivity and skin issues still occur if she uses a patch or undergarment that puts too much pressure on her skin She feels they are pretty well-controlled with her current allergy medicines of Xyzal 5 mg, Pepcid, Xolair  injections She did not have a good experience with her last allergy evaluation and really does not feel she needs to change her medications at this time.  I have discussed with her that we could add cromolyn and/or montelukast if needed but at this time we will continue unless her symptoms worsen.  Physical exam Thin pleasant female in no acute distress Ht 4' 10 (1.473 m)   Wt 90 lb (40.8 kg)   BMI 18.81 kg/m  Right trapezius spasm has resolved on exam today She has good cervical spine motion and the only thing that brings on any symptoms is hyperextension She has normal neurologic testing to the right extremity and full range of motion of the shoulder No noted skin changes today

## 2023-10-17 NOTE — Assessment & Plan Note (Signed)
 Improved following ESI  Start isometric neck exercise  PT eval with Jen Paa  Gradually resume pilates.

## 2023-10-17 NOTE — Assessment & Plan Note (Signed)
 Basically, we have stabilized GI and skin situation  Follow with no changes today

## 2023-10-18 NOTE — Unmapped (Signed)
 [facility] Special[facility] and Home Delivery Pharmacy Refill Coordination Note    Natasha Copeland, DOB: 05/23/1965  Phone: [phone] (home)       All above HIPAA information was verified with patient.         10/17/2023     4:26 PM   Specialty Rx Medication Refill Questionnaire   Which Medications would you like refilled and shipped? Xolair    Please list all current allergies: Doxipen   Have you missed any doses in the last 30 days? No   Have you had any changes to your medication(s) since your last refill? No   How much of each medication do you have remaining at home? (eg. number of tablets, injections, etc.) 0 on hand   If receiving an injectable medication, next injection date is 09/30/2023   Have you experienced any side effects in the last 30 days? No   Please enter the full address (street address, city, state, zip code) where you would like your medication(s) to be delivered to. [address], [place], [place] [address]   Please specify on which day you would like your medication(s) to arrive. Note: if you need your medication(s) within 3 days, please call the pharmacy to schedule your order at [phone]  10/26/2023   Has your insurance changed since your last refill? No   Would you like a pharmacist to call you to discuss your medication(s)? No   Do you require a signature for your package? (Note: if we are billing Medicare Part B or your order contains a controlled substance, we will require a signature) No   I have been provided my out of pocket cost for my medication and approve the pharmacy to charge the amount to my credit card on file. Yes         Completed refill call assessment today to schedule patient's medication shipment from the [facility] and Home Delivery Pharmacy [phone]).  All relevant notes have been reviewed.       Confirmed patient received a Conservation officer, historic buildings and a Surveyor, mining with first shipment. The patient will receive a drug information handout for each medication shipped and additional FDA Medication Guides as required.         REFERRAL TO PHARMACIST     Referral to the pharmacist: Not needed      [facility]     Shipping address confirmed in Epic.     Delivery Scheduled: Yes, Expected medication delivery date: 10/26/23.     Medication will be delivered via UPS to the prescription address in Epic WAM.    Natasha Copeland   [facility] Special[facility] and Home Delivery Pharmacy Specialty Technician

## 2023-10-23 DIAGNOSIS — Z681 Body mass index (BMI) 19 or less, adult: Secondary | ICD-10-CM | POA: Diagnosis not present

## 2023-10-23 DIAGNOSIS — Z13820 Encounter for screening for osteoporosis: Secondary | ICD-10-CM | POA: Diagnosis not present

## 2023-10-23 DIAGNOSIS — Z01419 Encounter for gynecological examination (general) (routine) without abnormal findings: Secondary | ICD-10-CM | POA: Diagnosis not present

## 2023-10-23 LAB — HM DEXA SCAN: HM Dexa Scan: NORMAL

## 2023-10-25 MED FILL — XOLAIR 300 MG/2 ML SUBCUTANEOUS SYRINGE: SUBCUTANEOUS | 28 days supply | Qty: 2 | Fill #5

## 2023-11-07 DIAGNOSIS — K5904 Chronic idiopathic constipation: Secondary | ICD-10-CM | POA: Diagnosis not present

## 2023-11-07 DIAGNOSIS — R140 Abdominal distension (gaseous): Secondary | ICD-10-CM | POA: Diagnosis not present

## 2023-11-07 DIAGNOSIS — K2970 Gastritis, unspecified, without bleeding: Secondary | ICD-10-CM | POA: Diagnosis not present

## 2023-11-09 ENCOUNTER — Encounter: Payer: Self-pay | Admitting: Physical Therapy

## 2023-11-09 ENCOUNTER — Ambulatory Visit (INDEPENDENT_AMBULATORY_CARE_PROVIDER_SITE_OTHER): Admitting: Physical Therapy

## 2023-11-09 DIAGNOSIS — Q796 Ehlers-Danlos syndrome, unspecified: Secondary | ICD-10-CM

## 2023-11-09 DIAGNOSIS — M5412 Radiculopathy, cervical region: Secondary | ICD-10-CM

## 2023-11-09 NOTE — Therapy (Signed)
 OUTPATIENT PHYSICAL THERAPY HYPERMOBILITY EVAL  Patient Name: Marissa [name] MRN: [id number] DOB:1965/08/10, 58 y.o., female Today's Date: 11/09/2023  END OF SESSION:  PT End of Session - 11/09/23 1711     Visit Number 1    Number of Visits 12    Date for Recertification  12/21/23    Authorization Type BCBS    Authorization Time Period submitted 10/23    PT Start Time 1305    PT Stop Time 1344    PT Time Calculation (min) 39 min    Activity Tolerance Patient tolerated treatment well    Behavior During Therapy [facility] for tasks assessed/performed          Past Medical History:  Diagnosis Date   Allergy    Anemia 10/24   Thought to be related to NSAID   Arthritis    Asthma    Ehlers-Danlos syndrome    GERD (gastroesophageal reflux disease)    History of pituitary tumor    Ocular migraine    Past Surgical History:  Procedure Laterality Date   KNEE SURGERY N/A 01/17/2010   SHOULDER SURGERY N/A 01/18/2007   SPINE SURGERY     C5-6 fusion   UTERINE FIBROID SURGERY N/A    Patient Active Problem List   Diagnosis Date Noted   Vitamin D  deficiency 06/20/2023   Radicular pain in right arm 06/20/2023   Subluxation 04/07/2022   Right hip subluxation, sequela 02/15/2022   Asthma 11/11/2021   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   Finger pain, right 07/15/2021   Knee pain, left 04/25/2019   Bicipital tendonitis of left shoulder 04/25/2019   Varicose veins of bilateral lower extremities with other complications 10/25/2018   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   POTS (postural orthostatic tachycardia syndrome) 08/29/2017   Chronic pain syndrome 08/29/2017   Chronic insomnia 08/29/2017   Wrist pain, acute 06/02/2016   Enthesopathy of right shoulder 01/28/2016   Right shoulder pain 01/28/2016   Family history of early CAD 11/30/2015   Cervical disc disorder with radiculopathy of cervical region 05/13/2014   Chronic right SI joint pain 08/28/2013   Hip pain, right 06/25/2013    Elbow pain, right 02/13/2013   Bunionette 01/07/2013   Ehlers-Danlos disease 11/08/2012   Unspecified visual disturbance 04/19/2012   Pituitary adenoma with extrasellar extension (HCC) 04/19/2012    PCP: Theophilus Andrews, Tully MD   REFERRING PROVIDER: Harvey Seltzer MD   REFERRING DIAG: Mertha Bullock Syndrome, Cervical disc disorder with radiculopathy  Rationale for Evaluation and Treatment: Rehabilitation  THERAPY DIAG:  Ehlers-Danlos syndrome  Radiculopathy, cervical region  ONSET DATE: Chronic with exacerbation  SUBJECTIVE:  SUBJECTIVE STATEMENT: This patient presents for physical therapy evaluation for hypermobile Ehlers-Danlos syndrome with complex history and multisystem involvement. Most recently she has had cervical radiculopathy on the Rt side.  Pain became severe in April, noticed pain in Rt elbow as well.  The sx were a bit different than previous L sided pain from similar issue. She had difficulty gripping due to pain.  Her usual ways to manage her sx  did not help.  Saw Dr. Harvey. She had shockwave.  Finally she had an MRI (see below) . Over the years she has tried many things for her pain-  including private Pilates sessions, soft tissue work/massage , dry needling all without lasting relief.  Rt elbow pain may be a separate issue. It is hard to get comfortable at night.  She went to [facility] for a cervical injection which she reports 5 or 6 days of relief.  At that time she reports it was 50% improved but the initial benefit is gone.  The doctor wants her to try PT because she so unstable before doing another injection.  She reports the doctor does not think that another injection be harmful but she will not likely get full relief. Due to the severe pain she reports she has become  more deconditioned overall but this is also due to other GI issues and mast cell activation.     PERTINENT HISTORY:  Cervical fusion L4-5 2016. POTS dysautonomia, spontaneous rupture of pectoralis right side Multijoint pain Ehlers-Danlos syndrome Chronic pain GI issues   Mast cell hypersensitivity and skin issues       Relevant historical information:     PAIN:  Are you having pain? Yes: NPRS scale: 6/10 Pain location: Rt neck and Rt arm  Pain description: Radiating, tightness, elbow is sharp and localized  Aggravating factors: Activity? Relieving factors: Valium  helpful for muscle relaxer, ice/heat  NO KT TAPE!! , Lidoacaine patch but does have skin reactions as a result   PRECAUTIONS: Other: instability   RED FLAGS: None    WEIGHT BEARING RESTRICTIONS: No  FALLS:  Has patient fallen in last 6 months? 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  DIAGNOSTIC FINDINGS:  MRI 07/22/23: IMPRESSION: 1. Prior ACDF at C5-6 without residual or recurrent stenosis. 2. Right paracentral disc protrusion at C6-7 without significant stenosis. The ventral right C7 nerve root could be affected. 3. Mild disc bulge with uncovertebral spurring at C4-5 with resultant mild right C5 foraminal stenosis.  PATIENT SURVEYS:  NDI:  NECK DISABILITY INDEX  Date: 11/09/23 Score  Pain intensity 3  2. Personal care (washing, dressing, etc.) 1 =  I can look after myself normally but it causes extra pain  3. Lifting 3 = Pain prevents me from lifting heavy weights but I  can manage light to medium   weights if they are conveniently positioned  4. Reading 3 = I can't read as much as I want because of moderate pain in my neck  5. Headaches 3 = I have moderate headaches, which come frequently  6. Concentration 1 =  I can concentrate fully when I want to with slight difficulty   7. Work 2 = I can do most of my usual work, but no more  8. Driving 2 =  I can drive my car as long as I want with moderate pain in my neck  9. Sleeping 4 = My sleep is greatly disturbed (3-5 hrs sleepless)   10. Recreation 3 = I am able to engage in a few of my usual recreation activities because of pain in   my neck  Total 28/50    Minimum Detectable Change (90% confidence): 5 points or 10% points  COGNITION: Overall cognitive status: Within functional limits for tasks assessed     SENSATION: Squeezing sensation but not numb or tingling    POSTURE: pt with overcorrected posture, decreased lordosis , tonic upper traps   PALPATION: Rt upper trap tension, knotted throughout   CERVICAL ROM:  Cervical Fusion 4-5: 9 yrs ago   Active ROM A/PROM (deg) 11/09/2023  Flexion 70  Extension 70 P  Right lateral flexion 58  Left lateral flexion 60  Right rotation WFL  Left rotation WFL    (Blank rows = not tested)  UE ROM: WFL   UE MMT: 4+/5 with pain on Rt UE       Grip strength 43# 34#   (Blank rows = not tested)  CERVICAL SPECIAL TESTS:  Neck flexor muscle endurance test: 12 sec   FUNCTIONAL TESTS:  NT   GAIT:  TREATMENT DATE:   [facility] Adult PT Treatment:                                                DATE: 11/09/23  Self Care: PT, plan of care, isometrics,Home exercise program, reinforced neutral and muscle support over Gripping, nervous system role in tension, pain                                                                                                                       PATIENT EDUCATION:  Education details: See above posture, alignment, neutral  zone and joint protection PNE/Explain pain   Joint inflammation/collagen/ligament laxity, muscular support Stretching vs stabilizing  Person educated: Patient Education method: Explanation, Demonstration, and Verbal cues Education comprehension: verbalized understanding and needs further education   HOME EXERCISE PROGRAM: Access Code: [email or web address] URL: [email or web address] Date: 11/09/2023 Prepared by: Marissa [name]  Exercises - Standing Isometric Cervical Sidebending with Manual Resistance  - 1 x daily - 7 x weekly - 2 sets - 10 reps - 5 hold - Standing Isometric Cervical Flexion with Manual Resistance  - 1 x daily - 7 x weekly - 1-2 sets - 10 reps - 5 hold - Seated Isometric Cervical Rotation  - 1 x daily - 7 x weekly - 1-2 sets - 10 reps - 5 hold - Standing Isometric Cervical Extension with Manual  Resistance  - 1 x daily - 7 x weekly - 1-2 sets - 10 reps - 5 hold - Seated Cervical Rotation AROM  - 2-3 x daily - 7 x weekly - 1 sets - 30 hold  ASSESSMENT:  CLINICAL IMPRESSION: Patient is a 58y.o. female who was seen today for physical therapy evaluation and treatment for cervical radiculopathy in the presence of Ehlers-Danlos type III.   OBJECTIVE IMPAIRMENTS: decreased activity tolerance, decreased endurance, decreased strength, increased fascial restrictions, impaired tone, impaired UE functional use, postural dysfunction, pain, and joint hypermobility.   ACTIVITY LIMITATIONS: carrying, lifting, sitting, standing, sleeping, bathing, dressing, reach over head, hygiene/grooming, and locomotion level  PARTICIPATION LIMITATIONS: cleaning, interpersonal relationship, shopping, community activity, and occupation  PERSONAL FACTORS: Past/current experiences, Time since onset of injury/illness/exacerbation, and 3+ comorbidities: Genetic condition, MCAS, POTS and previous cervical fusion are also affecting patient's functional outcome.   REHAB POTENTIAL: Good  CLINICAL  DECISION MAKING: Unstable/unpredictable  EVALUATION COMPLEXITY: High   GOALS: Goals reviewed with patient? Yes  SHORT TERM GOALS= LONG TERM GOALS:  Target date: 12/21/2023   Patient will be independent with final HEP upon discharge from PT and report consistent benefit following exercise completion.    Baseline:  Goal status: INITIAL  2.  Patient will be I with concepts of joint protection and stability as it pertains to joint hypermobility.  Baseline:  Goal status: INITIAL  3.  Patient will be able to demonstrate proper posture and lifting techniques related to spine health and reduction of symptoms.   Baseline:  Goal status: INITIAL  4.  Pt will be able to report Rt arm symptoms improved by 25% and  Baseline:  Goal status: INITIAL  5.  Pt will report Rt elbow pain reduced to mild to allow for improved sleep/rest.  Baseline:  Goal status: INITIAL   PLAN:  PT FREQUENCY: 1-2x/week  PT DURATION: 6 weeks  PLANNED INTERVENTIONS: 97164- PT Re-evaluation, 97750- Physical Performance Testing, 97110-Therapeutic exercises, 97530- Therapeutic activity, [id number]- Neuromuscular re-education, 97535- Self Care, [id number]- Manual therapy, Patient/Family education, Balance training, Cryotherapy, and Moist heat.  PLAN FOR NEXT SESSION: cervical isometrics, gentle shouder A/AROM, manual    Ronith Berti, PT 11/09/2023, 5:13 PM   Marissa [name], PT 11/09/23 5:41 PM Phone: [phone] Fax: [phone]

## 2023-11-15 NOTE — Progress Notes (Signed)
 [facility] Refill Coordination Note    Natasha Copeland, DOB: [birth year]  Phone: [phone] (home)       All above HIPAA information was verified with patient.         11/14/2023     3:54 PM   Specialty Rx Medication Refill Questionnaire   Which Medications would you like refilled and shipped? Xolair  (none on hand)   Please list all current allergies: Doxepin   Have you missed any doses in the last 30 days? No   Have you had any changes to your medication(s) since your last refill? No   How much of each medication do you have remaining at home? (eg. number of tablets, injections, etc.) 0   If receiving an injectable medication, next injection date is 11/27/2023   Have you experienced any side effects in the last 30 days? No   Please enter the full address (street address, city, state, zip code) where you would like your medication(s) to be delivered to. [address], [place], [place] [address]   Please specify on which day you would like your medication(s) to arrive. Note: if you need your medication(s) within 3 days, please call the pharmacy to schedule your order at [phone]  11/23/2023   Has your insurance changed since your last refill? No   Would you like a pharmacist to call you to discuss your medication(s)? No   Do you require a signature for your package? (Note: if we are billing Medicare Part B or your order contains a controlled substance, we will require a signature) No   I have been provided my out of pocket cost for my medication and approve the pharmacy to charge the amount to my credit card on file. Yes         Completed refill call assessment today to schedule patient's medication shipment from the [facility] [phone]).  All relevant notes have been reviewed.       Confirmed patient received a Conservation Officer, Historic Buildings and a Surveyor, Mining with first shipment. The patient will receive a drug information handout for each medication shipped and additional FDA Medication Guides as required.         REFERRAL TO PHARMACIST     Referral to the pharmacist: Not needed      [facility]     Shipping address confirmed in Epic.     Delivery Scheduled: Yes, Expected medication delivery date: 11/23/23.     Medication will be delivered via UPS to the prescription address in Epic WAM.    Kyra Myron   [facility] Specialty Technician

## 2023-11-16 ENCOUNTER — Ambulatory Visit (INDEPENDENT_AMBULATORY_CARE_PROVIDER_SITE_OTHER): Admitting: Physical Therapy

## 2023-11-16 ENCOUNTER — Encounter: Payer: Self-pay | Admitting: Physical Therapy

## 2023-11-16 DIAGNOSIS — M5412 Radiculopathy, cervical region: Secondary | ICD-10-CM | POA: Diagnosis not present

## 2023-11-16 DIAGNOSIS — Q796 Ehlers-Danlos syndrome, unspecified: Secondary | ICD-10-CM | POA: Diagnosis not present

## 2023-11-16 NOTE — Therapy (Unsigned)
 OUTPATIENT PHYSICAL THERAPY NOTE   Patient Name: Marissa [name] MRN: [id number] DOB:11-20-1965, 58 y.o., female Today's Date: 11/17/2023  END OF SESSION:  PT End of Session - 11/16/23 1436     Visit Number 2    Number of Visits 12    Date for Recertification  12/21/23    Authorization Type [employer]    Authorization Time Period submitted 10/23, 7 approved    Authorization - Visit Number 2    Authorization - Number of Visits 7    PT Start Time 1433    PT Stop Time 1515    PT Time Calculation (min) 42 min    Activity Tolerance Patient tolerated treatment well    Behavior During Therapy [facility] for tasks assessed/performed           Past Medical History:  Diagnosis Date   Allergy    Anemia 10/24   Thought to be related to NSAID   Arthritis    Asthma    Ehlers-Danlos syndrome    GERD (gastroesophageal reflux disease)    History of pituitary tumor    Ocular migraine    Past Surgical History:  Procedure Laterality Date   KNEE SURGERY N/A 01/17/2010   SHOULDER SURGERY N/A 01/18/2007   SPINE SURGERY     C5-6 fusion   UTERINE FIBROID SURGERY N/A    Patient Active Problem List   Diagnosis Date Noted   Vitamin D  deficiency 06/20/2023   Radicular pain in right arm 06/20/2023   Subluxation 04/07/2022   Right hip subluxation, sequela 02/15/2022   Asthma 11/11/2021   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   Finger pain, right 07/15/2021   Knee pain, left 04/25/2019   Bicipital tendonitis of left shoulder 04/25/2019   Varicose veins of bilateral lower extremities with other complications 10/25/2018   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   POTS (postural orthostatic tachycardia syndrome) 08/29/2017   Chronic pain syndrome 08/29/2017   Chronic insomnia 08/29/2017   Wrist pain, acute 06/02/2016   Enthesopathy of right shoulder 01/28/2016   Right shoulder pain 01/28/2016   Family history of early CAD 11/30/2015   Cervical disc disorder with radiculopathy of cervical region 05/13/2014    Chronic right SI joint pain 08/28/2013   Hip pain, right 06/25/2013   Elbow pain, right 02/13/2013   Bunionette 01/07/2013   Ehlers-Danlos disease 11/08/2012   Unspecified visual disturbance 04/19/2012   Pituitary adenoma with extrasellar extension (HCC) 04/19/2012    PCP: Theophilus Andrews, Tully MD   REFERRING PROVIDER: Harvey Seltzer MD   REFERRING DIAG: Mertha Bullock Syndrome, Cervical disc disorder with radiculopathy  Rationale for Evaluation and Treatment: Rehabilitation  THERAPY DIAG:  Ehlers-Danlos syndrome  Radiculopathy, cervical region  ONSET DATE: Chronic with exacerbation  SUBJECTIVE:  SUBJECTIVE STATEMENT:  Patient had some dental work today.  Tried walking 15 min and had so much pain in my neck and Rt UE.   This patient presents for physical therapy evaluation for hypermobile Ehlers-Danlos syndrome with complex history and multisystem involvement. Most recently she has had cervical radiculopathy on the Rt side.  Pain became severe in April, noticed pain in Rt elbow as well.  The sx were a bit different than previous L sided pain from similar issue. She had difficulty gripping due to pain.  Her usual ways to manage her sx  did not help.  Saw Dr. Harvey. She had shockwave.  Finally she had an MRI (see below) . Over the years she has tried many things for her pain-  including private Pilates sessions, soft tissue work/massage , dry needling all without lasting relief.  Rt elbow pain may be a separate issue. It is hard to get comfortable at night.  She went to [facility] for a cervical injection which she reports 5 or 6 days of relief.  At that time she reports it was 50% improved but the initial benefit is gone.  The doctor wants her to try PT because she so unstable before doing  another injection.  She reports the doctor does not think that another injection be harmful but she will not likely get full relief. Due to the severe pain she reports she has become more deconditioned overall but this is also due to other GI issues and mast cell activation.     PERTINENT HISTORY:  Cervical fusion L4-5 2016. POTS dysautonomia, spontaneous rupture of pectoralis right side Multijoint pain Ehlers-Danlos syndrome Chronic pain GI issues   Mast cell hypersensitivity and skin issues       Relevant historical information:     PAIN:  Are you having pain? Yes: NPRS scale: 6/10 Pain location: Rt neck and Rt arm  Pain description: Radiating, tightness, elbow is sharp and localized  Aggravating factors: Activity? Relieving factors: Valium  helpful for muscle relaxer, ice/heat  NO KT TAPE!! , Lidoacaine patch but does have skin reactions as a result   PRECAUTIONS: Other: instability   RED FLAGS: None    WEIGHT BEARING RESTRICTIONS: No  FALLS:  Has patient fallen in last 6 months? No  LIVING ENVIRONMENT: Lives with: lives with their family and lives alone Lives in: House/apartment Stairs: NT  Has following equipment at home: Patient has multiple braces including a soft collar and a hard collar for her neck  OCCUPATION: Works as a ESTATE MANAGER/LAND AGENT- in [employer]   PLOF: Independent  PATIENT GOALS: Pain relief   NEXT MD VISIT: as needed   OBJECTIVE:  Note: Objective measures were completed at Evaluation unless otherwise noted.  DIAGNOSTIC FINDINGS:  MRI 07/22/23: IMPRESSION: 1. Prior ACDF at C5-6 without residual or recurrent stenosis. 2. Right paracentral disc protrusion at C6-7 without significant stenosis. The ventral right C7 nerve root could be affected. 3. Mild disc bulge with uncovertebral spurring at C4-5 with resultant mild right C5 foraminal stenosis.  PATIENT SURVEYS:  NDI:  NECK DISABILITY INDEX  Date: 11/09/23 Score  Pain  intensity 3  2. Personal care (washing, dressing, etc.) 1 =  I can look after myself normally but it causes extra pain  3. Lifting 3 = Pain prevents me from lifting heavy weights but I can manage light to medium   weights if they are conveniently positioned  4. Reading 3 = I can't read as much as  I want because of moderate pain in my neck  5. Headaches 3 = I have moderate headaches, which come frequently  6. Concentration 1 =  I can concentrate fully when I want to with slight difficulty   7. Work 2 = I can do most of my usual work, but no more  8. Driving 2 =  I can drive my car as long as I want with moderate pain in my neck  9. Sleeping 4 = My sleep is greatly disturbed (3-5 hrs sleepless)   10. Recreation 3 = I am able to engage in a few of my usual recreation activities because of pain in   my neck  Total 28/50    Minimum Detectable Change (90% confidence): 5 points or 10% points  COGNITION: Overall cognitive status: Within functional limits for tasks assessed     SENSATION: Squeezing sensation but not numb or tingling    POSTURE: pt with overcorrected posture, decreased lordosis , tonic upper traps   PALPATION: Rt upper trap tension, knotted throughout   CERVICAL ROM:  Cervical Fusion 4-5: 9 yrs ago   Active ROM A/PROM (deg) 11/17/2023  Flexion 70  Extension 70 P  Right lateral flexion 58  Left lateral flexion 60  Right rotation WFL  Left rotation WFL    (Blank rows = not tested)  UE ROM: WFL   UE MMT: 4+/5 with pain on Rt UE       Grip strength 43# 34#   (Blank rows = not tested)  CERVICAL SPECIAL TESTS:  Neck flexor muscle endurance test: 12 sec   FUNCTIONAL TESTS:  NT   GAIT:  TREATMENT DATE:  [facility] Adult PT Treatment:                                                DATE: 11/16/23 Neuromuscular reeducation Soft tissue work, trigger point therapy bilateral Rt upper traps , levator scap Suboccipital release and gentle friction Yellow band (looped)  ER, chest press, OH lift  Cervical isometrics with ball as alternative     [facility] Adult PT Treatment:                                                DATE: 11/09/23  Self Care: PT, plan of care, isometrics,Home exercise program, reinforced neutral and muscle support over Gripping, nervous system role in tension, pain                                                                                                                       PATIENT EDUCATION:  Education details: See above posture, alignment, neutral zone and joint protection PNE/Explain pain   Joint inflammation/collagen/ligament laxity, muscular support Stretching vs stabilizing  Person educated: Patient Education method: Explanation, Demonstration, and Verbal cues Education comprehension: verbalized understanding and needs further education   HOME EXERCISE PROGRAM: Access Code: [email or web address] URL: [email or web address] Date: 11/09/2023 Prepared by: Delon Norma  Access Code: [email or web address] URL: [email or web address] Date: 11/16/2023 Prepared by: Delon Norma  Exercises - Standing Isometric Cervical Sidebending with Manual Resistance  - 1 x daily - 7 x weekly - 2 sets - 10 reps - 5 hold - Standing Isometric Cervical Flexion with Manual Resistance  - 1 x daily - 7 x weekly - 1-2 sets - 10 reps - 5 hold - Seated Isometric Cervical Rotation  - 1 x daily - 7 x weekly - 1-2 sets - 10 reps - 5 hold - Standing Isometric Cervical Extension with Manual Resistance  - 1 x daily - 7 x weekly - 1-2 sets - 10 reps - 5 hold - Seated Cervical Rotation AROM  - 2-3 x daily - 7 x weekly - 1 sets - 30 hold - Standing Shoulder External Rotation with Resistance  - 1 x daily - 7 x weekly - 2 sets - 10 reps - 5 hold - Shoulder Flexion Serratus Activation with Resistance  - 1 x daily - 7 x weekly - 2 sets - 10 reps - 5 hold   ASSESSMENT:   CLINICAL IMPRESSION: Focus of today's session was to provide gentle relief for neck  tension and headache pain, arm symptoms with neuromuscular reeducation using manual techniques and light neuromuscular exercises.  She does report improvement in her symptoms at the end of the session.  Recommended alternatives for isometrics in the cervical spine doing them in supine with double arm support or using a soft Pilates ball under her neck or against the wall.   OBJECTIVE IMPAIRMENTS: decreased activity tolerance, decreased endurance, decreased strength, increased fascial restrictions, impaired tone, impaired UE functional use, postural dysfunction, pain, and joint hypermobility.   ACTIVITY LIMITATIONS: carrying, lifting, sitting, standing, sleeping, bathing, dressing, reach over head, hygiene/grooming, and locomotion level  PARTICIPATION LIMITATIONS: cleaning, interpersonal relationship, shopping, community activity, and occupation  PERSONAL FACTORS: Past/current experiences, Time since onset of injury/illness/exacerbation, and 3+ comorbidities: Genetic condition, MCAS, POTS and previous cervical fusion are also affecting patient's functional outcome.   REHAB and headaches: Good  CLINICAL DECISION MAKING: Unstable/unpredictable  EVALUATION COMPLEXITY: High   GOALS: Goals reviewed with patient? Yes  SHORT TERM GOALS= LONG TERM GOALS:  Target date: 12/21/2023   Patient will be independent with final HEP upon discharge from PT and report consistent benefit following exercise completion.    Baseline:  Goal status: INITIAL  2.  Patient will be I with concepts of joint protection and stability as it pertains to joint hypermobility.  Baseline:  Goal status: INITIAL  3.  Patient will be able to demonstrate proper posture and lifting techniques related to spine health and reduction of symptoms.   Baseline:  Goal status: INITIAL  4.  Pt will be able to report Rt arm symptoms improved by 25% and  Baseline:  Goal status: INITIAL  5.  Pt will report Rt elbow pain reduced to mild  to allow for improved sleep/rest.  Baseline:  Goal status: INITIAL   PLAN:  PT FREQUENCY: 1-2x/week  PT DURATION: 6 weeks  PLANNED INTERVENTIONS: 97164- PT Re-evaluation, 97750- Physical Performance Testing, 97110-Therapeutic exercises, 97530- Therapeutic activity, [id number]- Neuromuscular re-education, 97535- Self Care, [id number]- Manual therapy, Patient/Family education, Balance training, Cryotherapy, and Moist heat.  PLAN FOR NEXT SESSION: cervical isometrics, gentle shouder A/AROM, manual  Laelle Bridgett, PT 11/17/2023, 9:47 AM   Delon Norma, PT 11/17/23 9:47 AM Phone: [phone] Fax: [phone]

## 2023-11-20 ENCOUNTER — Encounter: Payer: Self-pay | Admitting: Physical Therapy

## 2023-11-20 ENCOUNTER — Ambulatory Visit: Admitting: Physical Therapy

## 2023-11-20 DIAGNOSIS — Q796 Ehlers-Danlos syndrome, unspecified: Secondary | ICD-10-CM

## 2023-11-20 DIAGNOSIS — M5412 Radiculopathy, cervical region: Secondary | ICD-10-CM

## 2023-11-20 NOTE — Therapy (Signed)
 OUTPATIENT PHYSICAL THERAPY NOTE   Patient Name: Marissa [name] MRN: [id number] DOB:08/09/1965, 58 y.o., female Today's Date: 11/20/2023  END OF SESSION:  PT End of Session - 11/20/23 0847     Visit Number 3    Number of Visits 12    Date for Recertification  12/21/23    Authorization Type [employer]    Authorization Time Period submitted 10/23, 7 approved    Authorization - Visit Number 3    Authorization - Number of Visits 7    PT Start Time 0845    PT Stop Time 0935    PT Time Calculation (min) 50 min    Activity Tolerance Patient tolerated treatment well    Behavior During Therapy [facility] for tasks assessed/performed            Past Medical History:  Diagnosis Date   Allergy    Anemia 10/24   Thought to be related to NSAID   Arthritis    Asthma    Ehlers-Danlos syndrome    GERD (gastroesophageal reflux disease)    History of pituitary tumor    Ocular migraine    Past Surgical History:  Procedure Laterality Date   KNEE SURGERY N/A 01/17/2010   SHOULDER SURGERY N/A 01/18/2007   SPINE SURGERY     C5-6 fusion   UTERINE FIBROID SURGERY N/A    Patient Active Problem List   Diagnosis Date Noted   Vitamin D  deficiency 06/20/2023   Radicular pain in right arm 06/20/2023   Subluxation 04/07/2022   Right hip subluxation, sequela 02/15/2022   Asthma 11/11/2021   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   Finger pain, right 07/15/2021   Knee pain, left 04/25/2019   Bicipital tendonitis of left shoulder 04/25/2019   Varicose veins of bilateral lower extremities with other complications 10/25/2018   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   POTS (postural orthostatic tachycardia syndrome) 08/29/2017   Chronic pain syndrome 08/29/2017   Chronic insomnia 08/29/2017   Wrist pain, acute 06/02/2016   Enthesopathy of right shoulder 01/28/2016   Right shoulder pain 01/28/2016   Family history of early CAD 11/30/2015   Cervical disc disorder with radiculopathy of cervical region 05/13/2014    Chronic right SI joint pain 08/28/2013   Hip pain, right 06/25/2013   Elbow pain, right 02/13/2013   Bunionette 01/07/2013   Ehlers-Danlos disease 11/08/2012   Unspecified visual disturbance 04/19/2012   Pituitary adenoma with extrasellar extension (HCC) 04/19/2012    PCP: Theophilus Andrews, Tully MD   REFERRING PROVIDER: Harvey Seltzer MD   REFERRING DIAG: Mertha Bullock Syndrome, Cervical disc disorder with radiculopathy  Rationale for Evaluation and Treatment: Rehabilitation  THERAPY DIAG:  Ehlers-Danlos syndrome  Radiculopathy, cervical region  ONSET DATE: Chronic with exacerbation  SUBJECTIVE:  SUBJECTIVE STATEMENT:  Driving is really hard to manage- has to be closed but arms are floating.  Had to hold a microphone at an event. Takes meds to start.  She can't predict what the setup will be ather speaking events. She did fine after the last session.      Patient had some dental work today.  Tried walking 15 min and had so much pain in my neck and Rt UE.   This patient presents for physical therapy evaluation for hypermobile Ehlers-Danlos syndrome with complex history and multisystem involvement. Most recently she has had cervical radiculopathy on the Rt side.  Pain became severe in April, noticed pain in Rt elbow as well.  The sx were a bit different than previous L sided pain from similar issue. She had difficulty gripping due to pain.  Her usual ways to manage her sx  did not help.  Saw Dr. Harvey. She had shockwave.  Finally she had an MRI (see below) . Over the years she has tried many things for her pain-  including private Pilates sessions, soft tissue work/massage , dry needling all without lasting relief.  Rt elbow pain may be a separate issue. It is hard to get comfortable at night.   She went to [facility] for a cervical injection which she reports 5 or 6 days of relief.  At that time she reports it was 50% improved but the initial benefit is gone.  The doctor wants her to try PT because she so unstable before doing another injection.  She reports the doctor does not think that another injection be harmful but she will not likely get full relief. Due to the severe pain she reports she has become more deconditioned overall but this is also due to other GI issues and mast cell activation.     PERTINENT HISTORY:  Cervical fusion L4-5 2016. POTS dysautonomia, spontaneous rupture of pectoralis right side Multijoint pain Ehlers-Danlos syndrome Chronic pain GI issues   Mast cell hypersensitivity and skin issues       Relevant historical information:     PAIN:  Are you having pain? Yes: NPRS scale: 4/10 Pain location: Rt ELBOW, WRIST, THUMB  Pain description: Radiating, tightness, elbow is sharp and localized  Aggravating factors: Activity? Relieving factors: Valium  helpful for muscle relaxer, ice/heat  NO KT TAPE!! , Lidoacaine patch but does have skin reactions as a result   PRECAUTIONS: Other: instability  ,  NO KT TAPE!! , Lidoacaine patch but does have skin reactions as a result  RED FLAGS: None    WEIGHT BEARING RESTRICTIONS: No  FALLS:  Has patient fallen in last 6 months? No  LIVING ENVIRONMENT: Lives with: lives with their family and lives alone Lives in: House/apartment Stairs: NT  Has following equipment at home: Patient has multiple braces including a soft collar and a hard collar for her neck  OCCUPATION: Works as a ESTATE MANAGER/LAND AGENT- in [employer]   PLOF: Independent  PATIENT GOALS: Pain relief   NEXT MD VISIT: as needed   OBJECTIVE:  Note: Objective measures were completed at Evaluation unless otherwise noted.  DIAGNOSTIC FINDINGS:  MRI 07/22/23: IMPRESSION: 1. Prior ACDF at C5-6 without residual or recurrent  stenosis. 2. Right paracentral disc protrusion at C6-7 without significant stenosis. The ventral right C7 nerve root could be affected. 3. Mild disc bulge with uncovertebral spurring at C4-5 with resultant mild right C5 foraminal stenosis.  PATIENT SURVEYS:  NDI:  NECK DISABILITY INDEX  Date: 11/09/23 Score  Pain intensity 3  2. Personal care (washing, dressing, etc.) 1 =  I can look after myself normally but it causes extra pain  3. Lifting 3 = Pain prevents me from lifting heavy weights but I can manage light to medium   weights if they are conveniently positioned  4. Reading 3 = I can't read as much as I want because of moderate pain in my neck  5. Headaches 3 = I have moderate headaches, which come frequently  6. Concentration 1 =  I can concentrate fully when I want to with slight difficulty   7. Work 2 = I can do most of my usual work, but no more  8. Driving 2 =  I can drive my car as long as I want with moderate pain in my neck  9. Sleeping 4 = My sleep is greatly disturbed (3-5 hrs sleepless)   10. Recreation 3 = I am able to engage in a few of my usual recreation activities because of pain in   my neck  Total 28/50    Minimum Detectable Change (90% confidence): 5 points or 10% points  COGNITION: Overall cognitive status: Within functional limits for tasks assessed     SENSATION: Squeezing sensation but not numb or tingling    POSTURE: pt with overcorrected posture, decreased lordosis , tonic upper traps   PALPATION: Rt upper trap tension, knotted throughout   CERVICAL ROM:  Cervical Fusion 4-5: 9 yrs ago   Active ROM A/PROM (deg) 11/20/2023  Flexion 70  Extension 70 P  Right lateral flexion 58  Left lateral flexion 60  Right rotation WFL  Left rotation WFL    (Blank rows = not tested)  UE ROM: WFL   UE MMT: 4+/5 with pain on Rt UE       Grip strength 43# 34#   (Blank rows = not tested)  CERVICAL SPECIAL TESTS:  Neck flexor muscle endurance test: 12  sec   FUNCTIONAL TESTS:  NT   GAIT:  TREATMENT DATE:   [facility] Adult PT Treatment:                                                DATE: 11/20/23 Manual Therapy: Soft tissue work, trigger point therapy to bilateral sternocleidomastoid anterior and posterior scalenes pectorals Light pin and stretch to the anterior shoulder and chest Gentle suboccipital work Neuromuscular re-ed: Supine chin tuck arms overhead  DNF arms OH 5 sec  ER yellow loop isometric ER iso with chest press  Serratus lift yellow  OH lift  Self Care: Problem solving for neck and UE tension 15 min  [facility] Adult PT Treatment:                                                DATE: 11/16/23 Neuromuscular reeducation Soft tissue work, trigger point therapy bilateral Rt upper traps , levator scap Suboccipital release and gentle friction Yellow band (looped) ER, chest press, OH lift  Cervical isometrics with ball as alternative     [facility] Adult PT Treatment:  DATE: 11/09/23  Self Care: PT, plan of care, isometrics,Home exercise program, reinforced neutral and muscle support over Gripping, nervous system role in tension, pain                                                                                                                       PATIENT EDUCATION:  Education details: See above posture, alignment, neutral zone and joint protection PNE/Explain pain   Joint inflammation/collagen/ligament laxity, muscular support Stretching vs stabilizing  Person educated: Patient Education method: Explanation, Demonstration, and Verbal cues Education comprehension: verbalized understanding and needs further education   HOME EXERCISE PROGRAM: Access Code: [email or web address] URL: [email or web address] Date: 11/09/2023 Prepared by: Delon Norma  Access Code: [email or web address] URL: [email or web address] Date: 11/16/2023 Prepared by: Delon Norma  Exercises -  Standing Isometric Cervical Sidebending with Manual Resistance  - 1 x daily - 7 x weekly - 2 sets - 10 reps - 5 hold - Standing Isometric Cervical Flexion with Manual Resistance  - 1 x daily - 7 x weekly - 1-2 sets - 10 reps - 5 hold - Seated Isometric Cervical Rotation  - 1 x daily - 7 x weekly - 1-2 sets - 10 reps - 5 hold - Standing Isometric Cervical Extension with Manual Resistance  - 1 x daily - 7 x weekly - 1-2 sets - 10 reps - 5 hold - Seated Cervical Rotation AROM  - 2-3 x daily - 7 x weekly - 1 sets - 30 hold - Standing Shoulder External Rotation with Resistance  - 1 x daily - 7 x weekly - 2 sets - 10 reps - 5 hold - Shoulder Flexion Serratus Activation with Resistance  - 1 x daily - 7 x weekly - 2 sets - 10 reps - 5 hold   ASSESSMENT:   CLINICAL IMPRESSION: Time spent today problem-solving and communicating regarding related job and driving tasks.  Patient has difficulty with arms in the dependent position due to the excessive nerve tension and pull on the muscles, ligaments and tendons of her neck.  She is also limited by her short stature related to positioning when she drives.  Patient has tried multiple strategies but it is been unsuccessful in finding something consistently to help her.  She was able to perform supine stabilization exercises and incorporated a yellow band for low resistance gentle movement.  No new exercises added today    OBJECTIVE IMPAIRMENTS: decreased activity tolerance, decreased endurance, decreased strength, increased fascial restrictions, impaired tone, impaired UE functional use, postural dysfunction, pain, and joint hypermobility.   ACTIVITY LIMITATIONS: carrying, lifting, sitting, standing, sleeping, bathing, dressing, reach over head, hygiene/grooming, and locomotion level  PARTICIPATION LIMITATIONS: cleaning, interpersonal relationship, shopping, community activity, and occupation  PERSONAL FACTORS: Past/current experiences, Time since onset of  injury/illness/exacerbation, and 3+ comorbidities: Genetic condition, MCAS, POTS and previous cervical fusion are also affecting patient's functional outcome.   REHAB and headaches: Good  CLINICAL DECISION MAKING: Unstable/unpredictable  EVALUATION COMPLEXITY: High   GOALS: Goals reviewed with patient?  Yes  SHORT TERM GOALS= LONG TERM GOALS:  Target date: 12/21/2023   Patient will be independent with final HEP upon discharge from PT and report consistent benefit following exercise completion.    Baseline:  Goal status: INITIAL  2.  Patient will be I with concepts of joint protection and stability as it pertains to joint hypermobility.  Baseline:  Goal status: INITIAL  3.  Patient will be able to demonstrate proper posture and lifting techniques related to spine health and reduction of symptoms.   Baseline:  Goal status: INITIAL  4.  Pt will be able to report Rt arm symptoms improved by 25% and  Baseline:  Goal status: INITIAL  5.  Pt will report Rt elbow pain reduced to mild to allow for improved sleep/rest.  Baseline:  Goal status: INITIAL   PLAN:  PT FREQUENCY: 1-2x/week  PT DURATION: 6 weeks  PLANNED INTERVENTIONS: [id number]- PT Re-evaluation, 97750- Physical Performance Testing, 97110-Therapeutic exercises, 97530- Therapeutic activity, [id number]- Neuromuscular re-education, 97535- Self Care, [id number]- Manual therapy, Patient/Family education, Balance training, Cryotherapy, and Moist heat.  PLAN FOR NEXT SESSION: cervical isometrics, gentle shouder A/AROM, manual    Natale Barba, PT 11/20/2023, 10:51 AM   Delon Norma, PT 11/20/23 10:51 AM Phone: [phone] Fax: [phone]

## 2023-11-22 ENCOUNTER — Encounter: Payer: Self-pay | Admitting: Physical Therapy

## 2023-11-22 ENCOUNTER — Ambulatory Visit (INDEPENDENT_AMBULATORY_CARE_PROVIDER_SITE_OTHER): Admitting: Physical Therapy

## 2023-11-22 DIAGNOSIS — M5412 Radiculopathy, cervical region: Secondary | ICD-10-CM | POA: Diagnosis not present

## 2023-11-22 DIAGNOSIS — Q796 Ehlers-Danlos syndrome, unspecified: Secondary | ICD-10-CM

## 2023-11-22 MED FILL — XOLAIR 300 MG/2 ML SUBCUTANEOUS SYRINGE: SUBCUTANEOUS | 28 days supply | Qty: 2 | Fill #6

## 2023-11-22 NOTE — Therapy (Signed)
 OUTPATIENT PHYSICAL THERAPY NOTE   Patient Name: Marissa [name] MRN: [id number] DOB:11/12/1965, 58 y.o., female Today's Date: 11/22/2023  END OF SESSION:  PT End of Session - 11/22/23 0849     Visit Number 4    Number of Visits 12    Date for Recertification  12/21/23    Authorization Type BCBS    Authorization Time Period submitted 10/23, 7 approved    Authorization - Visit Number 4    Authorization - Number of Visits 7    PT Start Time 0850    PT Stop Time 0933    PT Time Calculation (min) 43 min    Activity Tolerance Patient tolerated treatment well    Behavior During Therapy [facility] for tasks assessed/performed             Past Medical History:  Diagnosis Date   Allergy    Anemia 10/24   Thought to be related to NSAID   Arthritis    Asthma    Ehlers-Danlos syndrome    GERD (gastroesophageal reflux disease)    History of pituitary tumor    Ocular migraine    Past Surgical History:  Procedure Laterality Date   KNEE SURGERY N/A 01/17/2010   SHOULDER SURGERY N/A 01/18/2007   SPINE SURGERY     C5-6 fusion   UTERINE FIBROID SURGERY N/A    Patient Active Problem List   Diagnosis Date Noted   Vitamin D  deficiency 06/20/2023   Radicular pain in right arm 06/20/2023   Subluxation 04/07/2022   Right hip subluxation, sequela 02/15/2022   Asthma 11/11/2021   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   Finger pain, right 07/15/2021   Knee pain, left 04/25/2019   Bicipital tendonitis of left shoulder 04/25/2019   Varicose veins of bilateral lower extremities with other complications 10/25/2018   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   POTS (postural orthostatic tachycardia syndrome) 08/29/2017   Chronic pain syndrome 08/29/2017   Chronic insomnia 08/29/2017   Wrist pain, acute 06/02/2016   Enthesopathy of right shoulder 01/28/2016   Right shoulder pain 01/28/2016   Family history of early CAD 11/30/2015   Cervical disc disorder with radiculopathy of cervical region  05/13/2014   Chronic right SI joint pain 08/28/2013   Hip pain, right 06/25/2013   Elbow pain, right 02/13/2013   Bunionette 01/07/2013   Ehlers-Danlos disease 11/08/2012   Unspecified visual disturbance 04/19/2012   Pituitary adenoma with extrasellar extension (HCC) 04/19/2012    PCP: Theophilus Andrews, Tully MD   REFERRING PROVIDER: Harvey Seltzer MD   REFERRING DIAG: Mertha Bullock Syndrome, Cervical disc disorder with radiculopathy  Rationale for Evaluation and Treatment: Rehabilitation  THERAPY DIAG:  Ehlers-Danlos syndrome  Radiculopathy, cervical region  ONSET DATE: Chronic with exacerbation  SUBJECTIVE:  SUBJECTIVE STATEMENT: Patient    Driving is really hard to manage- has to be closed but arms are floating.  Had to hold a microphone at an event. Takes meds to start.  She can't predict what the setup will be ather speaking events. She did fine after the last session.      Patient had some dental work today.  Tried walking 15 min and had so much pain in my neck and Rt UE.   This patient presents for physical therapy evaluation for hypermobile Ehlers-Danlos syndrome with complex history and multisystem involvement. Most recently she has had cervical radiculopathy on the Rt side.  Pain became severe in April, noticed pain in Rt elbow as well.  The sx were a bit different than previous L sided pain from similar issue. She had difficulty gripping due to pain.  Her usual ways to manage her sx  did not help.  Saw Dr. Harvey. She had shockwave.  Finally she had an MRI (see below) . Over the years she has tried many things for her pain-  including private Pilates sessions, soft tissue work/massage , dry needling all without lasting relief.  Rt elbow pain may be a separate issue. It is hard to get  comfortable at night.  She went to [facility] for a cervical injection which she reports 5 or 6 days of relief.  At that time she reports it was 50% improved but the initial benefit is gone.  The doctor wants her to try PT because she so unstable before doing another injection.  She reports the doctor does not think that another injection be harmful but she will not likely get full relief. Due to the severe pain she reports she has become more deconditioned overall but this is also due to other GI issues and mast cell activation.     PERTINENT HISTORY:  Cervical fusion L4-5 2016. POTS dysautonomia, spontaneous rupture of pectoralis right side Multijoint pain Ehlers-Danlos syndrome Chronic pain GI issues   Mast cell hypersensitivity and skin issues       Relevant historical information:     PAIN:  Are you having pain? Yes: NPRS scale: 4/10 Pain location: Rt ELBOW, WRIST, THUMB  Pain description: Radiating, tightness, elbow is sharp and localized  Aggravating factors: Activity? Relieving factors: Valium  helpful for muscle relaxer, ice/heat  NO KT TAPE!! , Lidoacaine patch but does have skin reactions as a result   PRECAUTIONS: Other: instability  ,  NO KT TAPE!! , Lidoacaine patch but does have skin reactions as a result  RED FLAGS: None    WEIGHT BEARING RESTRICTIONS: No  FALLS:  Has patient fallen in last 6 months? No  LIVING ENVIRONMENT: Lives with: lives with their family and lives alone Lives in: House/apartment Stairs: NT  Has following equipment at home: Patient has multiple braces including a soft collar and a hard collar for her neck  OCCUPATION: Works as a ESTATE MANAGER/LAND AGENT- in [employer]   PLOF: Independent  PATIENT GOALS: Pain relief   NEXT MD VISIT: as needed   OBJECTIVE:  Note: Objective measures were completed at Evaluation unless otherwise noted.  DIAGNOSTIC FINDINGS:  MRI 07/22/23: IMPRESSION: 1. Prior ACDF at C5-6 without  residual or recurrent stenosis. 2. Right paracentral disc protrusion at C6-7 without significant stenosis. The ventral right C7 nerve root could be affected. 3. Mild disc bulge with uncovertebral spurring at C4-5 with resultant mild right C5 foraminal stenosis.  PATIENT SURVEYS:  NDI:  NECK  DISABILITY INDEX  Date: 11/09/23 Score  Pain intensity 3  2. Personal care (washing, dressing, etc.) 1 =  I can look after myself normally but it causes extra pain  3. Lifting 3 = Pain prevents me from lifting heavy weights but I can manage light to medium   weights if they are conveniently positioned  4. Reading 3 = I can't read as much as I want because of moderate pain in my neck  5. Headaches 3 = I have moderate headaches, which come frequently  6. Concentration 1 =  I can concentrate fully when I want to with slight difficulty   7. Work 2 = I can do most of my usual work, but no more  8. Driving 2 =  I can drive my car as long as I want with moderate pain in my neck  9. Sleeping 4 = My sleep is greatly disturbed (3-5 hrs sleepless)   10. Recreation 3 = I am able to engage in a few of my usual recreation activities because of pain in   my neck  Total 28/50    Minimum Detectable Change (90% confidence): 5 points or 10% points  COGNITION: Overall cognitive status: Within functional limits for tasks assessed     SENSATION: Squeezing sensation but not numb or tingling    POSTURE: pt with overcorrected posture, decreased lordosis , tonic upper traps   PALPATION: Rt upper trap tension, knotted throughout   CERVICAL ROM:  Cervical Fusion 4-5: 9 yrs ago   Active ROM A/PROM (deg) 11/22/2023  Flexion 70  Extension 70 P  Right lateral flexion 58  Left lateral flexion 60  Right rotation WFL  Left rotation WFL    (Blank rows = not tested)  UE ROM: WFL   UE MMT: 4+/5 with pain on Rt UE       Grip strength 43# 34#   (Blank rows = not tested)  CERVICAL SPECIAL TESTS:  Neck flexor  muscle endurance test: 12 sec   FUNCTIONAL TESTS:  NT   GAIT:  TREATMENT DATE:    [facility] Adult PT Treatment:                                                DATE: 11/22/23 Neuromuscular re-ed: Ball under sacrum Supine dowel OH with tactile cues for GH head  ER isometric with Pilates circle 2 x 5 sec  IR isometric as above  LE femur arc UE arm arcs Dead bug variations Gentle suboccipital release   Self Care: Scarf wrap, techniques for reducing stress and tension, breathing    [facility] Adult PT Treatment:                                                DATE: 11/20/23 Manual Therapy: Soft tissue work, trigger point therapy to bilateral sternocleidomastoid anterior and posterior scalenes pectorals Light pin and stretch to the anterior shoulder and chest Gentle suboccipital work Neuromuscular re-ed: Supine chin tuck arms overhead  DNF arms OH 5 sec  ER yellow loop isometric ER iso with chest press  Serratus lift yellow  OH lift  Self Care: Problem solving for neck and UE tension 15 min  [facility] Adult PT Treatment:  DATE: 11/16/23 Neuromuscular reeducation Soft tissue work, trigger point therapy bilateral Rt upper traps , levator scap Suboccipital release and gentle friction Yellow band (looped) ER, chest press, OH lift  Cervical isometrics with ball as alternative     [facility] Adult PT Treatment:                                                DATE: 11/09/23  Self Care: PT, plan of care, isometrics,Home exercise program, reinforced neutral and muscle support over Gripping, nervous system role in tension, pain                                                                                                                       PATIENT EDUCATION:  Education details: See above posture, alignment, neutral zone and joint protection PNE/Explain pain   Joint inflammation/collagen/ligament laxity, muscular support Stretching vs  stabilizing  Person educated: Patient Education method: Explanation, Demonstration, and Verbal cues Education comprehension: verbalized understanding and needs further education   HOME EXERCISE PROGRAM: Access Code: [email or web address] URL: [email or web address] Date: 11/09/2023 Prepared by: Delon Norma  Access Code: [email or web address] URL: [email or web address] Date: 11/16/2023 Prepared by: Delon Norma  Exercises - Standing Isometric Cervical Sidebending with Manual Resistance  - 1 x daily - 7 x weekly - 2 sets - 10 reps - 5 hold - Standing Isometric Cervical Flexion with Manual Resistance  - 1 x daily - 7 x weekly - 1-2 sets - 10 reps - 5 hold - Seated Isometric Cervical Rotation  - 1 x daily - 7 x weekly - 1-2 sets - 10 reps - 5 hold - Standing Isometric Cervical Extension with Manual Resistance  - 1 x daily - 7 x weekly - 1-2 sets - 10 reps - 5 hold - Seated Cervical Rotation AROM  - 2-3 x daily - 7 x weekly - 1 sets - 30 hold - Standing Shoulder External Rotation with Resistance  - 1 x daily - 7 x weekly - 2 sets - 10 reps - 5 hold - Shoulder Flexion Serratus Activation with Resistance  - 1 x daily - 7 x weekly - 2 sets - 10 reps - 5 hold   ASSESSMENT:   CLINICAL IMPRESSION: Patient was able to work on relaxing upper back, neck and shoulders with light stability exercises.  She needs tactile cues for optimal glenohumeral position.  The use of the soft Pilates ball under her pelvis was effective. Will take her exercises to standing next time for more challenge. Showed her a scarf wrap for gentle support for sleep, work.    Time spent today problem-solving and communicating regarding related job and driving tasks.  Patient has difficulty with arms in the dependent position due to the excessive nerve tension and pull on the muscles, ligaments and tendons of her neck.  She is also limited by her short stature  related to positioning when she drives.  Patient has tried  multiple strategies but it is been unsuccessful in finding something consistently to help her.  She was able to perform supine stabilization exercises and incorporated a yellow band for low resistance gentle movement.  No new exercises added today    OBJECTIVE IMPAIRMENTS: decreased activity tolerance, decreased endurance, decreased strength, increased fascial restrictions, impaired tone, impaired UE functional use, postural dysfunction, pain, and joint hypermobility.   ACTIVITY LIMITATIONS: carrying, lifting, sitting, standing, sleeping, bathing, dressing, reach over head, hygiene/grooming, and locomotion level  PARTICIPATION LIMITATIONS: cleaning, interpersonal relationship, shopping, community activity, and occupation  PERSONAL FACTORS: Past/current experiences, Time since onset of injury/illness/exacerbation, and 3+ comorbidities: Genetic condition, MCAS, POTS and previous cervical fusion are also affecting patient's functional outcome.   REHAB and headaches: Good  CLINICAL DECISION MAKING: Unstable/unpredictable  EVALUATION COMPLEXITY: High   GOALS: Goals reviewed with patient? Yes  SHORT TERM GOALS= LONG TERM GOALS:  Target date: 12/21/2023   Patient will be independent with final HEP upon discharge from PT and report consistent benefit following exercise completion.    Baseline:  Goal status:ongoing  2.  Patient will be I with concepts of joint protection and stability as it pertains to joint hypermobility.  Baseline:  Goal status: ongoing   3.  Patient will be able to demonstrate proper posture and lifting techniques related to spine health and reduction of symptoms.   Baseline:  Goal status:ongoing   4.  Pt will be able to report Rt arm symptoms improved by 25%  Baseline:  Goal status:ongoing   5.  Pt will report Rt elbow pain reduced to mild to allow for improved sleep/rest.  Baseline:  Goal status: ongoing   PLAN:  PT FREQUENCY: 1-2x/week  PT DURATION: 6  weeks  PLANNED INTERVENTIONS: 97164- PT Re-evaluation, 97750- Physical Performance Testing, 97110-Therapeutic exercises, 97530- Therapeutic activity, [id number]- Neuromuscular re-education, 97535- Self Care, [id number]- Manual therapy, Patient/Family education, Balance training, Cryotherapy, and Moist heat.  PLAN FOR NEXT SESSION: standing scap, posture, cervical isometrics, gentle shouder A/AROM, manual    Chaos Carlile, PT 11/22/2023, 10:35 AM   Delon Norma, PT 11/22/23 10:35 AM Phone: [phone] Fax: [phone]

## 2023-11-23 ENCOUNTER — Ambulatory Visit (INDEPENDENT_AMBULATORY_CARE_PROVIDER_SITE_OTHER): Admitting: Sports Medicine

## 2023-11-23 VITALS — BP 110/72 | Ht <= 58 in | Wt 94.0 lb

## 2023-11-23 DIAGNOSIS — M792 Neuralgia and neuritis, unspecified: Secondary | ICD-10-CM

## 2023-11-23 DIAGNOSIS — S73001S Unspecified subluxation of right hip, sequela: Secondary | ICD-10-CM

## 2023-11-23 DIAGNOSIS — Q796 Ehlers-Danlos syndrome, unspecified: Secondary | ICD-10-CM

## 2023-11-23 DIAGNOSIS — M79645 Pain in left finger(s): Secondary | ICD-10-CM | POA: Diagnosis not present

## 2023-11-23 NOTE — Progress Notes (Signed)
 Chief complaint bilateral CMC joint pain worse on the left  Patient with Ehlers-Danlos syndrome who has been limited by radicular pain going into her right arm and right trapezius for a number of months.  We finally arranged for her to have an epidural steroid injection by Dr. Ibazebo.  This intervention has really helped and she is having much less arm pain. She is now working with Jen paa on her new physical therapy approach with limited motion and more isometric stress so has not a flare of her Ehlers-Danlos issues.  The key thing that is worsening since her last visit has been that both hands are getting very sore at the [facility] joint level and the left first MCP is subluxing periodically She also is having a recurrence of some of the right hip subluxation  Note Prucalopride 2mg  at HS has dramatically improved her GI symptoms  Physical exam Thin white female who is in no acute distress BP 110/72   Ht 4' 10 (1.473 m)   Wt 94 lb (42.6 kg)   BMI 19.65 kg/m   Palpation of the CMC joints bilaterally show significant hypermobility.  She is also subluxing her left MCP joint with motion at times She can easily demonstrate anterior subluxation of her right hip There is right trapezius spasm No real scapular dyskinesis today She has full range of motion of her right shoulder and arm without worsening pain

## 2023-11-23 NOTE — Assessment & Plan Note (Signed)
 I am hoping this will not flare like it did in the past and I suggested working with physical therapy particularly on an isometric exercise program. Even at home I will start her on 3-5 isometric contractions for hip flexion and hip abduction to do on a daily basis

## 2023-11-23 NOTE — Assessment & Plan Note (Signed)
 She is having a number of hypermobility issues at present and we did provide her with a brace for her left CMC and MCP joints today.  She should use this when lifting or doing active work with her hands We will continue trying to maximize the value of physical therapy approaches with primarily isometric and concentric contractions.  No new medications at this time  She will recheck with me in 1 month

## 2023-11-23 NOTE — Assessment & Plan Note (Signed)
 Fortunately this pain has lessened significantly since her epidural steroid injection.  She is continuing to work on neck posture and physical therapy and seems to be maintaining improved posture and having less symptoms that radiate into her right arm.  We will continue this program

## 2023-11-24 ENCOUNTER — Other Ambulatory Visit: Payer: Self-pay

## 2023-11-27 ENCOUNTER — Ambulatory Visit (INDEPENDENT_AMBULATORY_CARE_PROVIDER_SITE_OTHER): Admitting: Physical Therapy

## 2023-11-27 ENCOUNTER — Encounter: Payer: Self-pay | Admitting: Physical Therapy

## 2023-11-27 DIAGNOSIS — Q796 Ehlers-Danlos syndrome, unspecified: Secondary | ICD-10-CM

## 2023-11-27 DIAGNOSIS — M5412 Radiculopathy, cervical region: Secondary | ICD-10-CM | POA: Diagnosis not present

## 2023-11-27 NOTE — Therapy (Signed)
 OUTPATIENT PHYSICAL THERAPY NOTE   Patient Name: Marissa [name] MRN: [id number] DOB:15-Jun-1965, 58 y.o., female Today's Date: 11/27/2023  END OF SESSION:  PT End of Session - 11/27/23 0752     Visit Number 5    Number of Visits 12    Date for Recertification  12/21/23    Authorization Type BCBS    Authorization Time Period submitted 10/23, 7 approved    Authorization - Visit Number 5    Authorization - Number of Visits 7    PT Start Time 0800    PT Stop Time 0845    PT Time Calculation (min) 45 min    Activity Tolerance Patient tolerated treatment well    Behavior During Therapy [facility] for tasks assessed/performed             Past Medical History:  Diagnosis Date   Allergy    Anemia 10/24   Thought to be related to NSAID   Arthritis    Asthma    Ehlers-Danlos syndrome    GERD (gastroesophageal reflux disease)    History of pituitary tumor    Ocular migraine    Past Surgical History:  Procedure Laterality Date   KNEE SURGERY N/A 01/17/2010   SHOULDER SURGERY N/A 01/18/2007   SPINE SURGERY     C5-6 fusion   UTERINE FIBROID SURGERY N/A    Patient Active Problem List   Diagnosis Date Noted   Vitamin D  deficiency 06/20/2023   Radicular pain in right arm 06/20/2023   Subluxation 04/07/2022   Right hip subluxation, sequela 02/15/2022   Asthma 11/11/2021   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   Finger pain, right 07/15/2021   Knee pain, left 04/25/2019   Bicipital tendonitis of left shoulder 04/25/2019   Varicose veins of bilateral lower extremities with other complications 10/25/2018   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   POTS (postural orthostatic tachycardia syndrome) 08/29/2017   Chronic pain syndrome 08/29/2017   Chronic insomnia 08/29/2017   Wrist pain, acute 06/02/2016   Enthesopathy of right shoulder 01/28/2016   Right shoulder pain 01/28/2016   Family history of early CAD 11/30/2015   Cervical disc disorder with radiculopathy of cervical region  05/13/2014   Chronic right SI joint pain 08/28/2013   Hip pain, right 06/25/2013   Elbow pain, right 02/13/2013   Bunionette 01/07/2013   Ehlers-Danlos disease 11/08/2012   Unspecified visual disturbance 04/19/2012   Pituitary adenoma with extrasellar extension (HCC) 04/19/2012    PCP: Theophilus Andrews, Tully MD   REFERRING PROVIDER: Harvey Seltzer MD   REFERRING DIAG: Mertha Bullock Syndrome, Cervical disc disorder with radiculopathy  Rationale for Evaluation and Treatment: Rehabilitation  THERAPY DIAG:  Ehlers-Danlos syndrome  Radiculopathy, cervical region  ONSET DATE: Chronic with exacerbation  SUBJECTIVE:  SUBJECTIVE STATEMENT: Patient saw Dr. Harvey.  Pain in L thumb, Rt elbow and Lt shoulder, neck is more of a mechanical bony pain. Overall 5/10. Got a thumb brace which feels pretty good.     This patient presents for physical therapy evaluation for hypermobile Ehlers-Danlos syndrome with complex history and multisystem involvement. Most recently she has had cervical radiculopathy on the Rt side.  Pain became severe in April, noticed pain in Rt elbow as well.  The sx were a bit different than previous L sided pain from similar issue. She had difficulty gripping due to pain.  Her usual ways to manage her sx  did not help.  Saw Dr. Harvey. She had shockwave.  Finally she had an MRI (see below) . Over the years she has tried many things for her pain-  including private Pilates sessions, soft tissue work/massage , dry needling all without lasting relief.  Rt elbow pain may be a separate issue. It is hard to get comfortable at night.  She went to [facility] for a cervical injection which she reports 5 or 6 days of relief.  At that time she reports it was 50% improved but the initial benefit is  gone.  The doctor wants her to try PT because she so unstable before doing another injection.  She reports the doctor does not think that another injection be harmful but she will not likely get full relief. Due to the severe pain she reports she has become more deconditioned overall but this is also due to other GI issues and mast cell activation.     PERTINENT HISTORY:  Cervical fusion L4-5 2016. POTS dysautonomia, spontaneous rupture of pectoralis right side Multijoint pain Ehlers-Danlos syndrome Chronic pain GI issues   Mast cell hypersensitivity and skin issues       Relevant historical information:     PAIN:  Are you having pain? Yes: NPRS scale: 5/10 Pain location: Rt ELBOW, WRIST, THUMB  Pain description: Radiating, tightness, elbow is sharp and localized  Aggravating factors: Activity? Relieving factors: Valium  helpful for muscle relaxer, ice/heat  NO KT TAPE!! , Lidoacaine patch but does have skin reactions as a result   PRECAUTIONS: Other: instability  ,  NO KT TAPE!! , Lidoacaine patch but does have skin reactions as a result  RED FLAGS: None    WEIGHT BEARING RESTRICTIONS: No  FALLS:  Has patient fallen in last 6 months? No  LIVING ENVIRONMENT: Lives with: lives with their family and lives alone Lives in: House/apartment Stairs: NT  Has following equipment at home: Patient has multiple braces including a soft collar and a hard collar for her neck  OCCUPATION: Works as a ESTATE MANAGER/LAND AGENT- in [employer]   PLOF: Independent  PATIENT GOALS: Pain relief   NEXT MD VISIT: as needed   OBJECTIVE:  Note: Objective measures were completed at Evaluation unless otherwise noted.  DIAGNOSTIC FINDINGS:  MRI 07/22/23: IMPRESSION: 1. Prior ACDF at C5-6 without residual or recurrent stenosis. 2. Right paracentral disc protrusion at C6-7 without significant stenosis. The ventral right C7 nerve root could be affected. 3. Mild disc bulge with  uncovertebral spurring at C4-5 with resultant mild right C5 foraminal stenosis.  PATIENT SURVEYS:  NDI:  NECK DISABILITY INDEX  Date: 11/09/23 Score  Pain intensity 3  2. Personal care (washing, dressing, etc.) 1 =  I can look after myself normally but it causes extra pain  3. Lifting 3 = Pain prevents me from lifting heavy weights  but I can manage light to medium   weights if they are conveniently positioned  4. Reading 3 = I can't read as much as I want because of moderate pain in my neck  5. Headaches 3 = I have moderate headaches, which come frequently  6. Concentration 1 =  I can concentrate fully when I want to with slight difficulty   7. Work 2 = I can do most of my usual work, but no more  8. Driving 2 =  I can drive my car as long as I want with moderate pain in my neck  9. Sleeping 4 = My sleep is greatly disturbed (3-5 hrs sleepless)   10. Recreation 3 = I am able to engage in a few of my usual recreation activities because of pain in   my neck  Total 28/50    Minimum Detectable Change (90% confidence): 5 points or 10% points  COGNITION: Overall cognitive status: Within functional limits for tasks assessed     SENSATION: Squeezing sensation but not numb or tingling    POSTURE: pt with overcorrected posture, decreased lordosis , tonic upper traps   PALPATION: Rt upper trap tension, knotted throughout   CERVICAL ROM:  Cervical Fusion 4-5: 9 yrs ago   Active ROM A/PROM (deg) 11/27/2023  Flexion 70  Extension 70 P  Right lateral flexion 58  Left lateral flexion 60  Right rotation WFL  Left rotation WFL    (Blank rows = not tested)  UE ROM: WFL   UE MMT: 4+/5 with pain on Rt UE       Grip strength 43# 34#   (Blank rows = not tested)  CERVICAL SPECIAL TESTS:  Neck flexor muscle endurance test: 12 sec   FUNCTIONAL TESTS:  NT   GAIT:  TREATMENT DATE:   [facility] Adult PT Treatment:                                                DATE: 11/27/23 Manual  Therapy: Done after NM Re-ed Bilateral posterior cervicals and levator scap.upper trap in both prone and supine  Gentle light cervical release (not traction) and suboccipital friction Neuromuscular re-ed: Standing foam roller exercises Shoulder flexon AAROM dowel  Red looped band 0-90 deg  ER to chest press 90 deg  Scapular retraction x 10 Shoulder extension  Goal post with scapular retraction/ER x 10  Goal post x 10   [facility] Adult PT Treatment:                                                DATE: 11/22/23 Neuromuscular re-ed: Ball under sacrum Supine dowel OH with tactile cues for GH head  ER isometric with Pilates circle 2 x 5 sec  IR isometric as above  LE femur arc UE arm arcs Dead bug variations Gentle suboccipital release   Self Care: Scarf wrap, techniques for reducing stress and tension, breathing    [facility] Adult PT Treatment:  DATE: 11/20/23 Manual Therapy: Soft tissue work, trigger point therapy to bilateral sternocleidomastoid anterior and posterior scalenes pectorals Light pin and stretch to the anterior shoulder and chest Gentle suboccipital work Neuromuscular re-ed: Supine chin tuck arms overhead  DNF arms OH 5 sec  ER yellow loop isometric ER iso with chest press  Serratus lift yellow  OH lift  Self Care: Problem solving for neck and UE tension 15 min  [facility] Adult PT Treatment:                                                DATE: 11/16/23 Neuromuscular reeducation Soft tissue work, trigger point therapy bilateral Rt upper traps , levator scap Suboccipital release and gentle friction Yellow band (looped) ER, chest press, OH lift  Cervical isometrics with ball as alternative     [facility] Adult PT Treatment:                                                DATE: 11/09/23  Self Care: PT, plan of care, isometrics,Home exercise program, reinforced neutral and muscle support over Gripping, nervous system role in tension,  pain                                                                                                                       PATIENT EDUCATION:  Education details: See above posture, alignment, neutral zone and joint protection PNE/Explain pain   Joint inflammation/collagen/ligament laxity, muscular support Stretching vs stabilizing  Person educated: Patient Education method: Explanation, Demonstration, and Verbal cues Education comprehension: verbalized understanding and needs further education   HOME EXERCISE PROGRAM: Access Code: [email or web address] URL: [email or web address] Date: 11/09/2023 Prepared by: Delon Norma  Access Code: [email or web address] URL: [email or web address] Date: 11/16/2023 Prepared by: Delon Norma  Exercises - Standing Isometric Cervical Sidebending with Manual Resistance  - 1 x daily - 7 x weekly - 2 sets - 10 reps - 5 hold - Standing Isometric Cervical Flexion with Manual Resistance  - 1 x daily - 7 x weekly - 1-2 sets - 10 reps - 5 hold - Seated Isometric Cervical Rotation  - 1 x daily - 7 x weekly - 1-2 sets - 10 reps - 5 hold - Standing Isometric Cervical Extension with Manual Resistance  - 1 x daily - 7 x weekly - 1-2 sets - 10 reps - 5 hold - Seated Cervical Rotation AROM  - 2-3 x daily - 7 x weekly - 1 sets - 30 hold - Standing Shoulder External Rotation with Resistance  - 1 x daily - 7 x weekly - 2 sets - 10 reps - 5 hold - Shoulder Flexion Serratus Activation with Resistance  - 1 x daily - 7 x weekly - 2  sets - 10 reps - 5 hold   ASSESSMENT:   CLINICAL IMPRESSION: Patient was able to work in standing for about 15 minutes using the foam roller for postural awareness and gentle upper body stability.  Also shows to work him prone to use gravity as resistance with time she was able to perform an excellent scapular retraction and maintain for the duration of the other exercises.  Patient will continue to benefit from skilled therapy to  address functional deficits and improve quality of life  Time spent today problem-solving and communicating regarding related job and driving tasks.  Patient has difficulty with arms in the dependent position due to the excessive nerve tension and pull on the muscles, ligaments and tendons of her neck.  She is also limited by her short stature related to positioning when she drives.  Patient has tried multiple strategies but it is been unsuccessful in finding something consistently to help her.  She was able to perform supine stabilization exercises and incorporated a yellow band for low resistance gentle movement.  No new exercises added today    OBJECTIVE IMPAIRMENTS: decreased activity tolerance, decreased endurance, decreased strength, increased fascial restrictions, impaired tone, impaired UE functional use, postural dysfunction, pain, and joint hypermobility.   ACTIVITY LIMITATIONS: carrying, lifting, sitting, standing, sleeping, bathing, dressing, reach over head, hygiene/grooming, and locomotion level  PARTICIPATION LIMITATIONS: cleaning, interpersonal relationship, shopping, community activity, and occupation  PERSONAL FACTORS: Past/current experiences, Time since onset of injury/illness/exacerbation, and 3+ comorbidities: Genetic condition, MCAS, POTS and previous cervical fusion are also affecting patient's functional outcome.   REHAB and headaches: Good  CLINICAL DECISION MAKING: Unstable/unpredictable  EVALUATION COMPLEXITY: High   GOALS: Goals reviewed with patient? Yes  SHORT TERM GOALS= LONG TERM GOALS:  Target date: 12/21/2023   Patient will be independent with final HEP upon discharge from PT and report consistent benefit following exercise completion.    Baseline:  Goal status:ongoing  2.  Patient will be I with concepts of joint protection and stability as it pertains to joint hypermobility.  Baseline:  Goal status: ongoing   3.  Patient will be able to  demonstrate proper posture and lifting techniques related to spine health and reduction of symptoms.   Baseline:  Goal status:ongoing   4.  Pt will be able to report Rt arm symptoms improved by 25%  Baseline:  Goal status:ongoing   5.  Pt will report Rt elbow pain reduced to mild to allow for improved sleep/rest.  Baseline:  Goal status: ongoing   PLAN:  PT FREQUENCY: 1-2x/week  PT DURATION: 6 weeks  PLANNED INTERVENTIONS: 97164- PT Re-evaluation, 97750- Physical Performance Testing, 97110-Therapeutic exercises, 97530- Therapeutic activity, [id number]- Neuromuscular re-education, 97535- Self Care, [id number]- Manual therapy, Patient/Family education, Balance training, Cryotherapy, and Moist heat.  PLAN FOR NEXT SESSION: standing scap, posture, cervical isometrics, gentle shouder A/AROM, manual    Alvester Eads, PT 11/27/2023, 10:05 AM   Delon Norma, PT 11/27/23 10:05 AM Phone: [phone] Fax: [phone]

## 2023-11-28 ENCOUNTER — Other Ambulatory Visit: Payer: Self-pay | Admitting: Sports Medicine

## 2023-11-28 MED ORDER — DIAZEPAM 5 MG PO TABS: ORAL_TABLET | ORAL | 3 refills | Status: AC

## 2023-11-28 NOTE — Progress Notes (Signed)
 Diazepam refill

## 2023-11-29 ENCOUNTER — Encounter: Payer: Self-pay | Admitting: Physical Therapy

## 2023-11-29 ENCOUNTER — Ambulatory Visit (INDEPENDENT_AMBULATORY_CARE_PROVIDER_SITE_OTHER): Admitting: Physical Therapy

## 2023-11-29 DIAGNOSIS — M5412 Radiculopathy, cervical region: Secondary | ICD-10-CM | POA: Diagnosis not present

## 2023-11-29 DIAGNOSIS — Q796 Ehlers-Danlos syndrome, unspecified: Secondary | ICD-10-CM | POA: Diagnosis not present

## 2023-11-29 NOTE — Therapy (Signed)
 OUTPATIENT PHYSICAL THERAPY NOTE   Patient Name: Marissa [name] MRN: [id number] DOB:13-Nov-1965, 58 y.o., female Today's Date: 11/29/2023  END OF SESSION:  PT End of Session - 11/29/23 0801     Visit Number 6    Number of Visits 12    Date for Recertification  12/21/23    Authorization Type BC[employer]    Authorization Time Period submitted 10/23, 7 approved    Authorization - Visit Number 6    Authorization - Number of Visits 7    PT Start Time 0800    PT Stop Time 0845    PT Time Calculation (min) 45 min    Activity Tolerance Patient tolerated treatment well    Behavior During Therapy [facility] for tasks assessed/performed              Past Medical History:  Diagnosis Date   Allergy    Anemia 10/24   Thought to be related to NSAID   Arthritis    Asthma    Ehlers-Danlos syndrome    GERD (gastroesophageal reflux disease)    History of pituitary tumor    Ocular migraine    Past Surgical History:  Procedure Laterality Date   KNEE SURGERY N/A 01/17/2010   SHOULDER SURGERY N/A 01/18/2007   SPINE SURGERY     C5-6 fusion   UTERINE FIBROID SURGERY N/A    Patient Active Problem List   Diagnosis Date Noted   Vitamin D  deficiency 06/20/2023   Radicular pain in right arm 06/20/2023   Subluxation 04/07/2022   Right hip subluxation, sequela 02/15/2022   Asthma 11/11/2021   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   Finger pain, right 07/15/2021   Knee pain, left 04/25/2019   Bicipital tendonitis of left shoulder 04/25/2019   Varicose veins of bilateral lower extremities with other complications 10/25/2018   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   POTS (postural orthostatic tachycardia syndrome) 08/29/2017   Chronic pain syndrome 08/29/2017   Chronic insomnia 08/29/2017   Wrist pain, acute 06/02/2016   Enthesopathy of right shoulder 01/28/2016   Right shoulder pain 01/28/2016   Family history of early CAD 11/30/2015   Cervical disc disorder with radiculopathy of cervical region  05/13/2014   Chronic right SI joint pain 08/28/2013   Hip pain, right 06/25/2013   Elbow pain, right 02/13/2013   Bunionette 01/07/2013   Ehlers-Danlos disease 11/08/2012   Unspecified visual disturbance 04/19/2012   Pituitary adenoma with extrasellar extension (HCC) 04/19/2012    PCP: Theophilus Andrews, Tully MD   REFERRING PROVIDER: Harvey Seltzer MD   REFERRING DIAG: Mertha Bullock Syndrome, Cervical disc disorder with radiculopathy  Rationale for Evaluation and Treatment: Rehabilitation  THERAPY DIAG:  Ehlers-Danlos syndrome  Radiculopathy, cervical region  ONSET DATE: Chronic with exacerbation  SUBJECTIVE:  SUBJECTIVE STATEMENT: Wearing brace and it is helping.  Hard to type with it.    This patient presents for physical therapy evaluation for hypermobile Ehlers-Danlos syndrome with complex history and multisystem involvement. Most recently she has had cervical radiculopathy on the Rt side.  Pain became severe in April, noticed pain in Rt elbow as well.  The sx were a bit different than previous L sided pain from similar issue. She had difficulty gripping due to pain.  Her usual ways to manage her sx  did not help.  Saw Dr. Harvey. She had shockwave.  Finally she had an MRI (see below) . Over the years she has tried many things for her pain-  including private Pilates sessions, soft tissue work/massage , dry needling all without lasting relief.  Rt elbow pain may be a separate issue. It is hard to get comfortable at night.  She went to [facility] for a cervical injection which she reports 5 or 6 days of relief.  At that time she reports it was 50% improved but the initial benefit is gone.  The doctor wants her to try PT because she so unstable before doing another injection.  She reports the  doctor does not think that another injection be harmful but she will not likely get full relief. Due to the severe pain she reports she has become more deconditioned overall but this is also due to other GI issues and mast cell activation.     PERTINENT HISTORY:  Cervical fusion L4-5 2016. POTS dysautonomia, spontaneous rupture of pectoralis right side Multijoint pain Ehlers-Danlos syndrome Chronic pain GI issues   Mast cell hypersensitivity and skin issues       Relevant historical information:     PAIN:  Are you having pain? Yes: NPRS scale: 4/10 Pain location: Rt ELBOW, WRIST, THUMB  Pain description: Radiating, tightness, elbow is sharp and localized  Aggravating factors: Activity? Relieving factors: Valium  helpful for muscle relaxer, ice/heat  NO KT TAPE!! , Lidoacaine patch but does have skin reactions as a result   PRECAUTIONS: Other: instability  ,  NO KT TAPE!! , Lidoacaine patch but does have skin reactions as a result  RED FLAGS: None    WEIGHT BEARING RESTRICTIONS: No  FALLS:  Has patient fallen in last 6 months? No  LIVING ENVIRONMENT: Lives with: lives with their family and lives alone Lives in: House/apartment Stairs: NT  Has following equipment at home: Patient has multiple braces including a soft collar and a hard collar for her neck  OCCUPATION: Works as a ESTATE MANAGER/LAND AGENT- in [employer]   PLOF: Independent  PATIENT GOALS: Pain relief   NEXT MD VISIT: as needed   OBJECTIVE:  Note: Objective measures were completed at Evaluation unless otherwise noted.  DIAGNOSTIC FINDINGS:  MRI 07/22/23: IMPRESSION: 1. Prior ACDF at C5-6 without residual or recurrent stenosis. 2. Right paracentral disc protrusion at C6-7 without significant stenosis. The ventral right C7 nerve root could be affected. 3. Mild disc bulge with uncovertebral spurring at C4-5 with resultant mild right C5 foraminal stenosis.  PATIENT SURVEYS:  NDI:  NECK  DISABILITY INDEX  Date: 11/09/23 Score  Pain intensity 3  2. Personal care (washing, dressing, etc.) 1 =  I can look after myself normally but it causes extra pain  3. Lifting 3 = Pain prevents me from lifting heavy weights but I can manage light to medium   weights if they are conveniently positioned  4. Reading 3 = I  can't read as much as I want because of moderate pain in my neck  5. Headaches 3 = I have moderate headaches, which come frequently  6. Concentration 1 =  I can concentrate fully when I want to with slight difficulty   7. Work 2 = I can do most of my usual work, but no more  8. Driving 2 =  I can drive my car as long as I want with moderate pain in my neck  9. Sleeping 4 = My sleep is greatly disturbed (3-5 hrs sleepless)   10. Recreation 3 = I am able to engage in a few of my usual recreation activities because of pain in   my neck  Total 28/50    Minimum Detectable Change (90% confidence): 5 points or 10% points  COGNITION: Overall cognitive status: Within functional limits for tasks assessed     SENSATION: Squeezing sensation but not numb or tingling    POSTURE: pt with overcorrected posture, decreased lordosis , tonic upper traps   PALPATION: Rt upper trap tension, knotted throughout   CERVICAL ROM:  Cervical Fusion 4-5: 9 yrs ago   Active ROM A/PROM (deg) 11/29/2023  Flexion 70  Extension 70 P  Right lateral flexion 58  Left lateral flexion 60  Right rotation WFL  Left rotation WFL    (Blank rows = not tested)  UE ROM: WFL   UE MMT: 4+/5 with pain on Rt UE       Grip strength 43# 34#   (Blank rows = not tested)  CERVICAL SPECIAL TESTS:  Neck flexor muscle endurance test: 12 sec   FUNCTIONAL TESTS:  NT   GAIT:  TREATMENT DATE:    [facility] Adult PT Treatment:                                                DATE: 11/29/23 Manual Therapy: Bilateral posterior cervicals and levator scap.upper trap in and supine , trigger point release Gentle  light cervical release (not traction) and suboccipital friction Therapeutic Activity: Standing foam roller (horizontal) glides  Standing (longitudinal) red looped band ER Serratus Flexion arms overhead red band  Sidelying scaption red band  x 12, 2nd set with 2 lbs wgt.   ER 2 lbs  x 15  Supine 3 lbs narrow press x 15 and triceps  x 10   [facility] Adult PT Treatment:                                                DATE: 11/27/23 Manual Therapy: Done after NM Re-ed Bilateral posterior cervicals and levator scap.upper trap in both prone and supine  Gentle light cervical release (not traction) and suboccipital friction Neuromuscular re-ed: Standing foam roller exercises Shoulder flexon AAROM dowel  Red looped band 0-90 deg  ER to chest press 90 deg  Scapular retraction x 10 Shoulder extension  Goal post with scapular retraction/ER x 10  Goal post x 10   [facility] Adult PT Treatment:  DATE: 11/22/23 Neuromuscular re-ed: Ball under sacrum Supine dowel OH with tactile cues for GH head  ER isometric with Pilates circle 2 x 5 sec  IR isometric as above  LE femur arc UE arm arcs Dead bug variations Gentle suboccipital release   Self Care: Scarf wrap, techniques for reducing stress and tension, breathing    [facility] Adult PT Treatment:                                                DATE: 11/20/23 Manual Therapy: Soft tissue work, trigger point therapy to bilateral sternocleidomastoid anterior and posterior scalenes pectorals Light pin and stretch to the anterior shoulder and chest Gentle suboccipital work Neuromuscular re-ed: Supine chin tuck arms overhead  DNF arms OH 5 sec  ER yellow loop isometric ER iso with chest press  Serratus lift yellow  OH lift  Self Care: Problem solving for neck and UE tension 15 min  [facility] Adult PT Treatment:                                                DATE: 11/16/23 Neuromuscular reeducation Soft tissue work,  trigger point therapy bilateral Rt upper traps , levator scap Suboccipital release and gentle friction Yellow band (looped) ER, chest press, OH lift  Cervical isometrics with ball as alternative     [facility] Adult PT Treatment:                                                DATE: 11/09/23  Self Care: PT, plan of care, isometrics,Home exercise program, reinforced neutral and muscle support over Gripping, nervous system role in tension, pain                                                                                                                       PATIENT EDUCATION:  Education details: See above posture, alignment, neutral zone and joint protection PNE/Explain pain   Joint inflammation/collagen/ligament laxity, muscular support Stretching vs stabilizing  Person educated: Patient Education method: Explanation, Demonstration, and Verbal cues Education comprehension: verbalized understanding and needs further education   HOME EXERCISE PROGRAM: Access Code: [email or web address] URL: [email or web address] Date: 11/09/2023 Prepared by: Delon Norma  Access Code: [email or web address] URL: [email or web address] Date: 11/16/2023 Prepared by: Delon Norma  Exercises - Standing Isometric Cervical Sidebending with Manual Resistance  - 1 x daily - 7 x weekly - 2 sets - 10 reps - 5 hold - Standing Isometric Cervical Flexion with Manual Resistance  - 1 x daily - 7 x weekly - 1-2 sets - 10 reps - 5 hold -  Seated Isometric Cervical Rotation  - 1 x daily - 7 x weekly - 1-2 sets - 10 reps - 5 hold - Standing Isometric Cervical Extension with Manual Resistance  - 1 x daily - 7 x weekly - 1-2 sets - 10 reps - 5 hold - Seated Cervical Rotation AROM  - 2-3 x daily - 7 x weekly - 1 sets - 30 hold - Standing Shoulder External Rotation with Resistance  - 1 x daily - 7 x weekly - 2 sets - 10 reps - 5 hold - Shoulder Flexion Serratus Activation with Resistance  - 1 x daily - 7 x weekly - 2 sets  - 10 reps - 5 hold   ASSESSMENT:   CLINICAL IMPRESSION:  Session focused primarily on building stability and strength in the upper body while monitoring cervical spine position.  Encouraged scapular motion and not so much scapular depression when performing upper body moves.  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  She has 1 more visit next week will ask for more authorized visits at her next appointment.  Her progress has been gradual but she has been able to tolerate gentle strengthening exercises without excessive rebound pain.    OBJECTIVE IMPAIRMENTS: decreased activity tolerance, decreased endurance, decreased strength, increased fascial restrictions, impaired tone, impaired UE functional use, postural dysfunction, pain, and joint hypermobility.   ACTIVITY LIMITATIONS: carrying, lifting, sitting, standing, sleeping, bathing, dressing, reach over head, hygiene/grooming, and locomotion level  PARTICIPATION LIMITATIONS: cleaning, interpersonal relationship, shopping, community activity, and occupation  PERSONAL FACTORS: Past/current experiences, Time since onset of injury/illness/exacerbation, and 3+ comorbidities: Genetic condition, MCAS, POTS and previous cervical fusion are also affecting patient's functional outcome.   REHAB and headaches: Good  CLINICAL DECISION MAKING: Unstable/unpredictable  EVALUATION COMPLEXITY: High   GOALS: Goals reviewed with patient? Yes  SHORT TERM GOALS= LONG TERM GOALS:  Target date: 12/21/2023   Patient will be independent with final HEP upon discharge from PT and report consistent benefit following exercise completion.    Baseline:  Goal status:ongoing  2.  Patient will be I with concepts of joint protection and stability as it pertains to joint hypermobility.  Baseline:  Goal status: ongoing   3.  Patient will be able to demonstrate  proper posture and lifting techniques related to spine health and reduction of symptoms.   Baseline:  Goal status:ongoing   4.  Pt will be able to report Rt arm symptoms improved by 25%  Baseline:  Goal status:ongoing   5.  Pt will report Rt elbow pain reduced to mild to allow for improved sleep/rest.  Baseline:  Goal status: ongoing   PLAN:  PT FREQUENCY: 1-2x/week  PT DURATION: 6 weeks  PLANNED INTERVENTIONS: 97164- PT Re-evaluation, 97750- Physical Performance Testing, 97110-Therapeutic exercises, 97530- Therapeutic activity, [id number]- Neuromuscular re-education, 97535- Self Care, [id number]- Manual therapy, Patient/Family education, Balance training, Cryotherapy, and Moist heat.  PLAN FOR NEXT SESSION: standing scap, posture, cervical isometrics, gentle shouder A/AROM, manual    Timiyah Romito, PT 11/29/2023, 9:00 AM   Delon Norma, PT 11/29/23 9:00 AM Phone: [phone] Fax: [phone]

## 2023-12-04 ENCOUNTER — Encounter: Admitting: Physical Therapy

## 2023-12-05 ENCOUNTER — Ambulatory Visit: Admitting: Sports Medicine

## 2023-12-06 ENCOUNTER — Ambulatory Visit (INDEPENDENT_AMBULATORY_CARE_PROVIDER_SITE_OTHER): Admitting: Physical Therapy

## 2023-12-06 ENCOUNTER — Encounter: Payer: Self-pay | Admitting: Physical Therapy

## 2023-12-06 DIAGNOSIS — M5412 Radiculopathy, cervical region: Secondary | ICD-10-CM

## 2023-12-06 DIAGNOSIS — Q796 Ehlers-Danlos syndrome, unspecified: Secondary | ICD-10-CM | POA: Diagnosis not present

## 2023-12-06 NOTE — Therapy (Signed)
 OUTPATIENT PHYSICAL THERAPY RENEWAL  Patient Name: Marissa [name] MRN: [id number] DOB:01/16/1966, 58 y.o., female Today's Date: 12/06/2023  END OF SESSION:  PT End of Session - 12/06/23 0805     Visit Number 7    Number of Visits 15    Date for Recertification  01/31/24    Authorization Type BCBS    Authorization Time Period submitted 10/23, 7 approved, submitted on 12/06/23 for 8 more    Authorization - Visit Number 7    Authorization - Number of Visits 7    PT Start Time 0800    PT Stop Time 0845    PT Time Calculation (min) 45 min    Activity Tolerance Patient tolerated treatment well    Behavior During Therapy [facility] for tasks assessed/performed              Past Medical History:  Diagnosis Date   Allergy    Anemia 10/24   Thought to be related to NSAID   Arthritis    Asthma    Ehlers-Danlos syndrome    GERD (gastroesophageal reflux disease)    History of pituitary tumor    Ocular migraine    Past Surgical History:  Procedure Laterality Date   KNEE SURGERY N/A 01/17/2010   SHOULDER SURGERY N/A 01/18/2007   SPINE SURGERY     C5-6 fusion   UTERINE FIBROID SURGERY N/A    Patient Active Problem List   Diagnosis Date Noted   Vitamin D  deficiency 06/20/2023   Radicular pain in right arm 06/20/2023   Subluxation 04/07/2022   Right hip subluxation, sequela 02/15/2022   Asthma 11/11/2021   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   Finger pain, right 07/15/2021   Knee pain, left 04/25/2019   Bicipital tendonitis of left shoulder 04/25/2019   Varicose veins of bilateral lower extremities with other complications 10/25/2018   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   POTS (postural orthostatic tachycardia syndrome) 08/29/2017   Chronic pain syndrome 08/29/2017   Chronic insomnia 08/29/2017   Wrist pain, acute 06/02/2016   Enthesopathy of right shoulder 01/28/2016   Right shoulder pain 01/28/2016   Family history of early CAD 11/30/2015   Cervical disc disorder with  radiculopathy of cervical region 05/13/2014   Chronic right SI joint pain 08/28/2013   Hip pain, right 06/25/2013   Elbow pain, right 02/13/2013   Bunionette 01/07/2013   Ehlers-Danlos disease 11/08/2012   Unspecified visual disturbance 04/19/2012   Pituitary adenoma with extrasellar extension (HCC) 04/19/2012    PCP: Theophilus Andrews, Tully MD   REFERRING PROVIDER: Harvey Seltzer MD   REFERRING DIAG: Mertha Bullock Syndrome, Cervical disc disorder with radiculopathy  Rationale for Evaluation and Treatment: Rehabilitation  THERAPY DIAG:  Ehlers-Danlos syndrome  Radiculopathy, cervical region  ONSET DATE: Chronic with exacerbation  SUBJECTIVE:  SUBJECTIVE STATEMENT: I have more full body pain which has been ongoing the past few weeks. Maybe because my arm is a bit better. L shoulder is more than the other and my weird.    This patient presents for physical therapy evaluation for hypermobile Ehlers-Danlos syndrome with complex history and multisystem involvement. Most recently she has had cervical radiculopathy on the Rt side.  Pain became severe in April, noticed pain in Rt elbow as well.  The sx were a bit different than previous L sided pain from similar issue. She had difficulty gripping due to pain.  Her usual ways to manage her sx  did not help.  Saw Dr. Harvey. She had shockwave.  Finally she had an MRI (see below) . Over the years she has tried many things for her pain-  including private Pilates sessions, soft tissue work/massage , dry needling all without lasting relief.  Rt elbow pain may be a separate issue. It is hard to get comfortable at night.  She went to [facility] for a cervical injection which she reports 5 or 6 days of relief.  At that time she reports it was 50% improved but  the initial benefit is gone.  The doctor wants her to try PT because she so unstable before doing another injection.  She reports the doctor does not think that another injection be harmful but she will not likely get full relief. Due to the severe pain she reports she has become more deconditioned overall but this is also due to other GI issues and mast cell activation.     PERTINENT HISTORY:  Cervical fusion L4-5 2016. POTS dysautonomia, spontaneous rupture of pectoralis right side Multijoint pain Ehlers-Danlos syndrome Chronic pain GI issues   Mast cell hypersensitivity and skin issues       Relevant historical information:     PAIN:  Are you having pain? Yes: NPRS scale: 3/10 Pain location: Rt arm  Pain description: Radiating, tightness, elbow is sharp and localized  Aggravating factors: Activity? Relieving factors: Valium  helpful for muscle relaxer, ice/heat  NO KT TAPE!! , Lidoacaine patch but does have skin reactions as a result  Lt. 6/10  PRECAUTIONS: Other: instability  ,  NO KT TAPE!! , Lidoacaine patch but does have skin reactions as a result  RED FLAGS: None    WEIGHT BEARING RESTRICTIONS: No  FALLS:  Has patient fallen in last 6 months? No  LIVING ENVIRONMENT: Lives with: lives with their family and lives alone Lives in: House/apartment Stairs: NT  Has following equipment at home: Patient has multiple braces including a soft collar and a hard collar for her neck  OCCUPATION: Works as a ESTATE MANAGER/LAND AGENT- in [employer]   PLOF: Independent  PATIENT GOALS: Pain relief   NEXT MD VISIT: as needed   OBJECTIVE:  Note: Objective measures were completed at Evaluation unless otherwise noted.  DIAGNOSTIC FINDINGS:  MRI 07/22/23: IMPRESSION: 1. Prior ACDF at C5-6 without residual or recurrent stenosis. 2. Right paracentral disc protrusion at C6-7 without significant stenosis. The ventral right C7 nerve root could be affected. 3. Mild disc bulge  with uncovertebral spurring at C4-5 with resultant mild right C5 foraminal stenosis.  PATIENT SURVEYS:  NDI:  NECK DISABILITY INDEX  Date: 11/09/23 Score  Pain intensity 3  2. Personal care (washing, dressing, etc.) 1 =  I can look after myself normally but it causes extra pain  3. Lifting 3 = Pain prevents me from lifting heavy weights but  I can manage light to medium   weights if they are conveniently positioned  4. Reading 3 = I can't read as much as I want because of moderate pain in my neck  5. Headaches 3 = I have moderate headaches, which come frequently  6. Concentration 1 =  I can concentrate fully when I want to with slight difficulty   7. Work 2 = I can do most of my usual work, but no more  8. Driving 2 =  I can drive my car as long as I want with moderate pain in my neck  9. Sleeping 4 = My sleep is greatly disturbed (3-5 hrs sleepless)   10. Recreation 3 = I am able to engage in a few of my usual recreation activities because of pain in   my neck  Total 28/50    Minimum Detectable Change (90% confidence): 5 points or 10% points  COGNITION: Overall cognitive status: Within functional limits for tasks assessed     SENSATION: Squeezing sensation but not numb or tingling    POSTURE: pt with overcorrected posture, decreased lordosis , tonic upper traps   PALPATION: Rt upper trap tension, knotted throughout   CERVICAL ROM:  Cervical Fusion 4-5: 9 yrs ago   Active ROM A/PROM (deg) 12/06/2023 AROM 12/06/23  Flexion 70 70  Extension 70 P 70  Right lateral flexion 58 60+  Left lateral flexion 60 60+  Right rotation [facility] WFL, hyper   Left rotation WFL  WFL, hyper   (Blank rows = not tested)  UE ROM: WFL   UE MMT: 4+/5 with pain on Rt UE  12/06/23: WFL in shoulder flex/abd 4+/5         Grip strength 43# 34#   (Blank rows = not tested)  CERVICAL SPECIAL TESTS:  Neck flexor muscle endurance test: 12 sec   FUNCTIONAL TESTS:  NT   GAIT:  TREATMENT  DATE:   [facility] Adult PT Treatment:                                                DATE: 12/06/23 Therapeutic Exercise: Recumbent bike 6 min L1 for conditioning  Manual Therapy: Gentle suboccipital release  facilitated chin tuck Therapeutic Activity: Standing green Thera-Band row and extension , modified to red band for improved shoulder position  Standing looped yellow band for postural exercises and shoulder stability  Supine chest pull  Bridging  x 10  Added horiz abd yellow band pull to iso bridge  Self care:  Discussion about goals plan of care and upcoming trip to [place] which will require her to travel, walk, carry and lift items.  She is understandably quite anxious and uneasy about it and she has to work 2 days after she returns and there are a lot of things out of her control while on the trip.    [facility] Adult PT Treatment:                                                DATE: 11/29/23 Manual Therapy: Bilateral posterior cervicals and levator scap.upper trap in and supine , trigger point release Gentle light cervical release (not traction) and suboccipital friction Therapeutic Activity: Standing foam roller (horizontal)  glides  Standing (longitudinal) red looped band ER Serratus Flexion arms overhead red band  Sidelying scaption red band  x 12, 2nd set with 2 lbs wgt.   ER 2 lbs  x 15  Supine 3 lbs narrow press x 15 and triceps  x 10   [facility] Adult PT Treatment:                                                DATE: 11/27/23 Manual Therapy: Done after NM Re-ed Bilateral posterior cervicals and levator scap.upper trap in both prone and supine  Gentle light cervical release (not traction) and suboccipital friction Neuromuscular re-ed: Standing foam roller exercises Shoulder flexon AAROM dowel  Red looped band 0-90 deg  ER to chest press 90 deg  Scapular retraction x 10 Shoulder extension  Goal post with scapular retraction/ER x 10  Goal post x 10   [facility] Adult PT  Treatment:                                                DATE: 11/22/23 Neuromuscular re-ed: Ball under sacrum Supine dowel OH with tactile cues for GH head  ER isometric with Pilates circle 2 x 5 sec  IR isometric as above  LE femur arc UE arm arcs Dead bug variations Gentle suboccipital release   Self Care: Scarf wrap, techniques for reducing stress and tension, breathing    [facility] Adult PT Treatment:                                                DATE: 11/20/23 Manual Therapy: Soft tissue work, trigger point therapy to bilateral sternocleidomastoid anterior and posterior scalenes pectorals Light pin and stretch to the anterior shoulder and chest Gentle suboccipital work Neuromuscular re-ed: Supine chin tuck arms overhead  DNF arms OH 5 sec  ER yellow loop isometric ER iso with chest press  Serratus lift yellow  OH lift  Self Care: Problem solving for neck and UE tension 15 min  [facility] Adult PT Treatment:                                                DATE: 11/16/23 Neuromuscular reeducation Soft tissue work, trigger point therapy bilateral Rt upper traps , levator scap Suboccipital release and gentle friction Yellow band (looped) ER, chest press, OH lift  Cervical isometrics with ball as alternative     [facility] Adult PT Treatment:                                                DATE: 11/09/23  Self Care: PT, plan of care, isometrics,Home exercise program, reinforced neutral and muscle support over Gripping, nervous system role in tension, pain  PATIENT EDUCATION:  Education details: See above posture, alignment, neutral zone and joint protection PNE/Explain pain   Joint inflammation/collagen/ligament laxity, muscular support Stretching vs stabilizing  Person educated: Patient Education method: Explanation, Demonstration, and Verbal cues Education  comprehension: verbalized understanding and needs further education   HOME EXERCISE PROGRAM: Access Code: [email or web address] URL: [email or web address] Date: 11/09/2023 Prepared by: Delon Norma  Access Code: [email or web address] URL: [email or web address] Date: 11/16/2023 Prepared by: Delon Norma  Exercises - Standing Isometric Cervical Sidebending with Manual Resistance  - 1 x daily - 7 x weekly - 2 sets - 10 reps - 5 hold - Standing Isometric Cervical Flexion with Manual Resistance  - 1 x daily - 7 x weekly - 1-2 sets - 10 reps - 5 hold - Seated Isometric Cervical Rotation  - 1 x daily - 7 x weekly - 1-2 sets - 10 reps - 5 hold - Standing Isometric Cervical Extension with Manual Resistance  - 1 x daily - 7 x weekly - 1-2 sets - 10 reps - 5 hold - Seated Cervical Rotation AROM  - 2-3 x daily - 7 x weekly - 1 sets - 30 hold - Standing Shoulder External Rotation with Resistance  - 1 x daily - 7 x weekly - 2 sets - 10 reps - 5 hold - Shoulder Flexion Serratus Activation with Resistance  - 1 x daily - 7 x weekly - 2 sets - 10 reps - 5 hold   ASSESSMENT:   CLINICAL IMPRESSION: Fleda has completed 7 physical therapy sessions which have included joint positioning postural stability and manual therapy.  She continues to have pain in her neck but much improvement in her right arm symptoms.  She notices her left shoulder is painful.  Recently she has noted increased global pain in her body not only upper but hips and legs as well.  Some of this she questions might just be from aging and a hypermobile body.  She may consider reaching out regarding medications to address this.  Patient would like to continue PT to address fatigue and deconditioning which may be related to her chronic issues.  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  When she has these all day events she notes 2 days after she is completely wiped out.  While this is  part of her chronic health condition it is something that she could improve with gradual progression and symptom tracking.  She did improve her NDI score but only by 2 points (26/50).  She will continue to benefit from skilled therapy to achieve her goals and a realistic but consistent manner.   Her progress has been gradual but she has been able to tolerate gentle strengthening exercises without excessive rebound pain.    OBJECTIVE IMPAIRMENTS: decreased activity tolerance, decreased endurance, decreased strength, increased fascial restrictions, impaired tone, impaired UE functional use, postural dysfunction, pain, and joint hypermobility.   ACTIVITY LIMITATIONS: carrying, lifting, sitting, standing, sleeping, bathing, dressing, reach over head, hygiene/grooming, and locomotion level  PARTICIPATION LIMITATIONS: cleaning, interpersonal relationship, shopping, community activity, and occupation  PERSONAL FACTORS: Past/current experiences, Time since onset of injury/illness/exacerbation, and 3+ comorbidities: Genetic condition, MCAS, POTS and previous cervical fusion are also affecting patient's functional outcome.   REHAB and headaches: Good  CLINICAL DECISION MAKING: Unstable/unpredictable  EVALUATION COMPLEXITY: High   GOALS: Goals reviewed with patient? Yes  SHORT TERM GOALS= LONG TERM GOALS:  Target date: 12/21/2023   Patient will be independent with  final HEP upon discharge from PT and report consistent benefit following exercise completion.    Baseline:  Goal status:ongoing  2.  Patient will be I with concepts of joint protection and stability as it pertains to joint hypermobility.  Baseline:  Goal status: ongoing   3.  Patient will be able to demonstrate proper posture and lifting techniques related to spine health and reduction of symptoms.   Baseline:  Goal status:ongoing   4.  Pt will be able to report Rt arm symptoms improved by 25%  Baseline:  Goal status:ongoing    5.  Pt will report Rt elbow pain reduced to mild to allow for improved sleep/rest.  Baseline:  Goal status: ongoing , more sporatic  6. Patient will be able to walk or use bike for 15 min without increased neck pain .    Baseline: consistent pain with any walking and this  frustrates her    Goal status: NEW    7.  Pt will be able to feel more confident in her positioning during HEP and activity as a proxy for proprioception.    Baseline: she commonly doubts shoulder position    Goal status: NEW  PLAN:  PT FREQUENCY: 1-2x/week  PT DURATION: 6 weeks  PLANNED INTERVENTIONS: 97164- PT Re-evaluation, 97750- Physical Performance Testing, 97110-Therapeutic exercises, 97530- Therapeutic activity, [id number]- Neuromuscular re-education, 97535- Self Care, 02859- Manual therapy, Patient/Family education, Balance training, Cryotherapy, and Moist heat.  PLAN FOR NEXT SESSION: standing scap, posture, cervical isometrics, gentle shouder A/AROM, manual    Kalese Ensz, PT 12/06/2023, 9:55 AM   Delon Norma, PT 12/06/23 9:55 AM Phone: [phone] Fax: [phone]

## 2023-12-10 IMAGING — MR MR CERVICAL SPINE W/O CM
4 of 6 series · 30 of 48 positions shown · non-contrast
Comparison: Radiographs of the cervical spine 01/21/2021. Cervical
spine MRI 08/01/2018.

CLINICAL DATA: Provided history: Neck pain, chronic. Additional
history provided: Patient reports neck pain and left shoulder
numbness since November 2020. Prior C5-C6 fusion 6 years ago.

EXAM:
MRI CERVICAL SPINE WITHOUT CONTRAST
TECHNIQUE: Multiplanar, multisequence MR imaging of the cervical spine was
performed. No intravenous contrast was administered.

[Series 3: T2 · sagittal · 3.0mm · 0.41mm/px · 6 of 17 slices shown (1 of 3)]
[im 1/17]
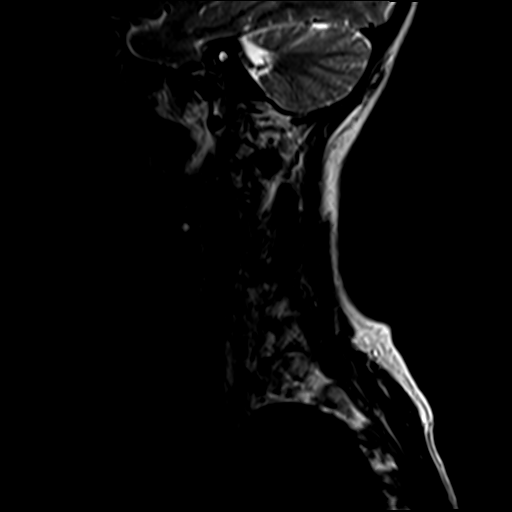
[im 4/17]
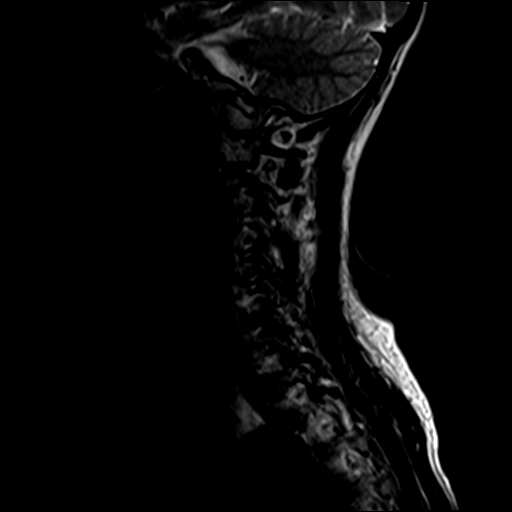
[im 7/17]
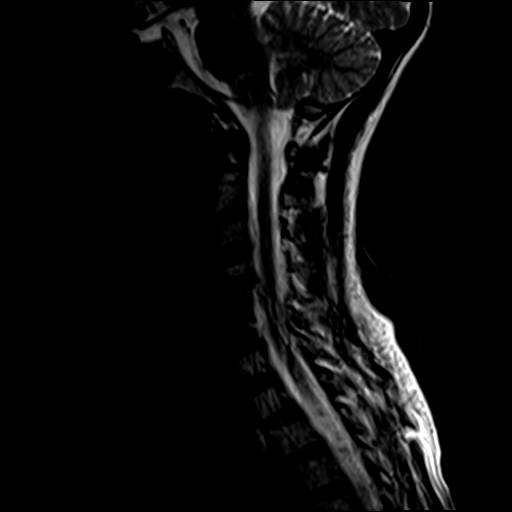
[im 10/17]
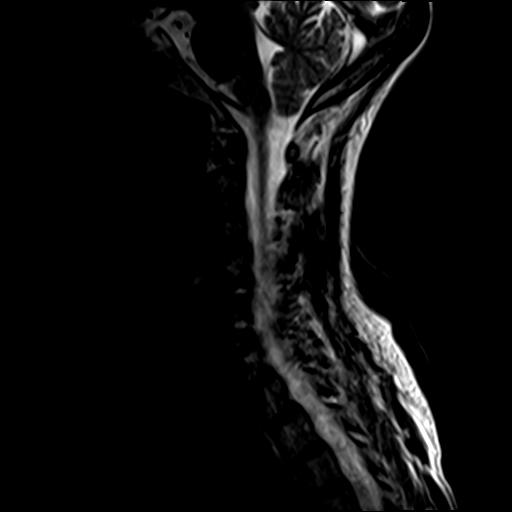
[im 13/17]
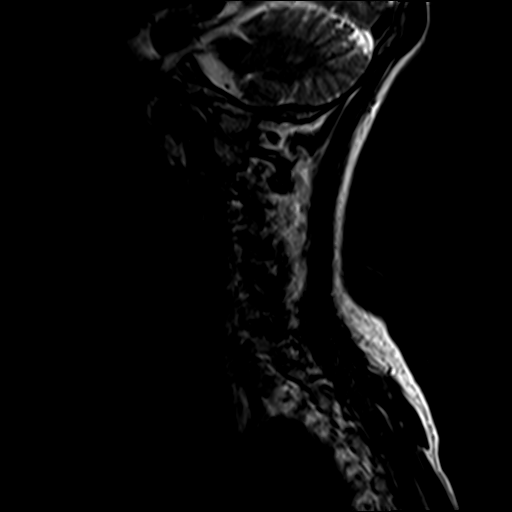
[im 17/17]
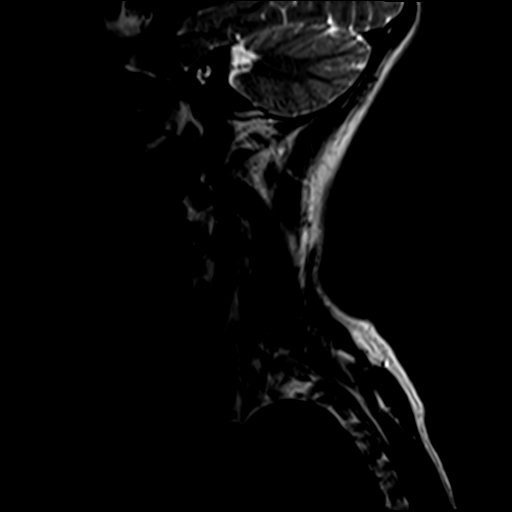

[Series 5: T2 · sagittal · 3.0mm · 0.41mm/px · 7 of 17 slices shown (2 of 3)]
[im 1/17]
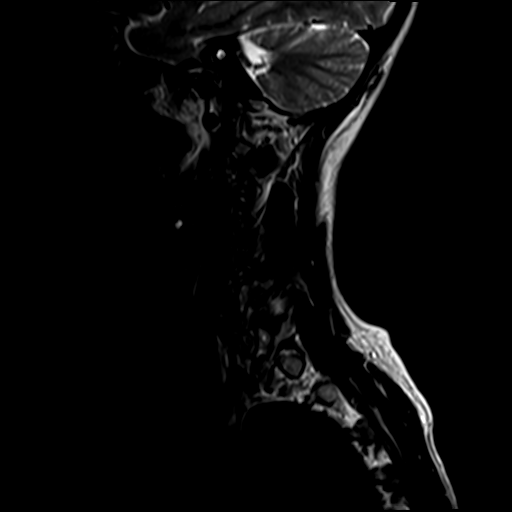
[im 3/17]
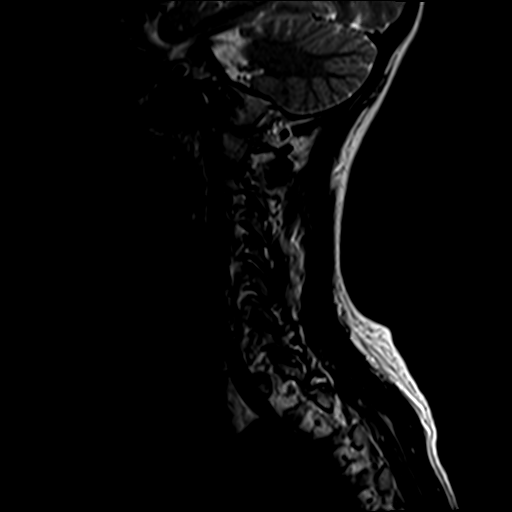
[im 6/17]
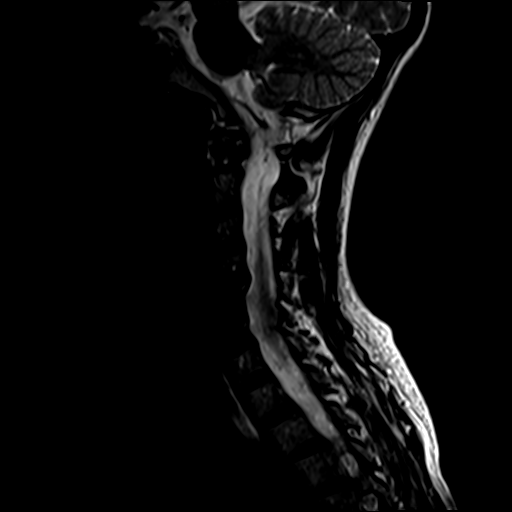
[im 9/17]
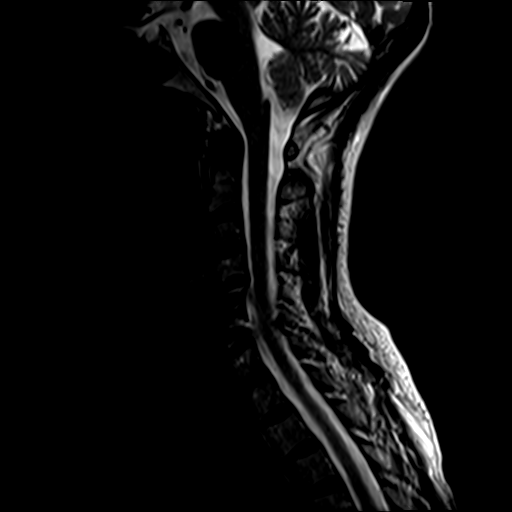
[im 11/17]
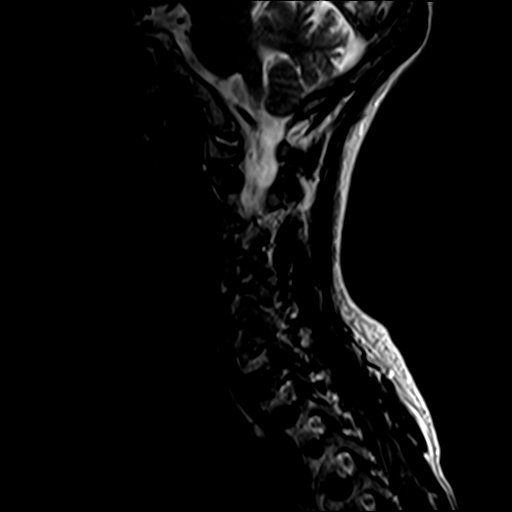
[im 14/17]
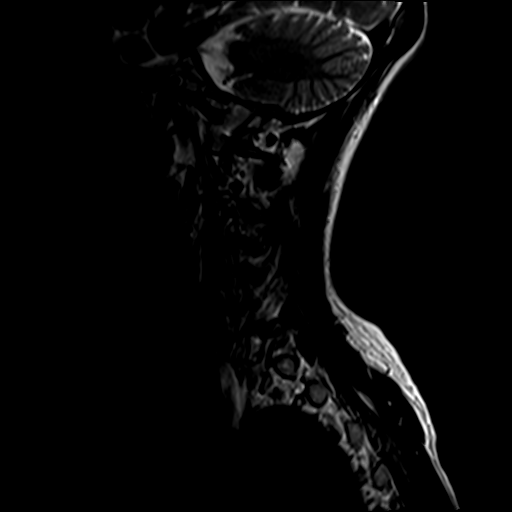
[im 17/17]
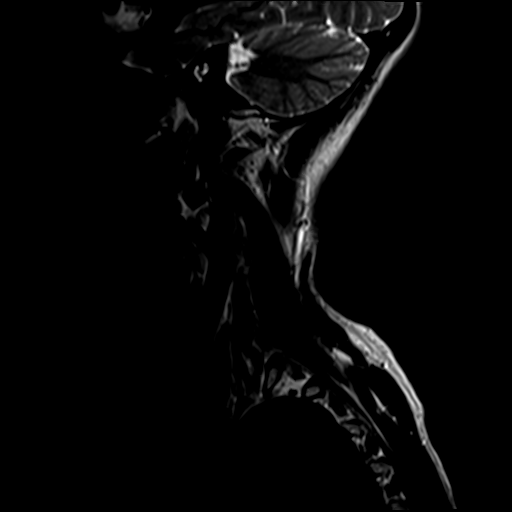

[Series 6: T1 · sagittal · 3.0mm · 0.82mm/px · 6 of 17 slices shown]
[im 1/17]
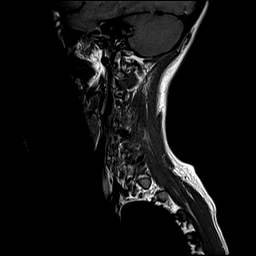
[im 3/17]
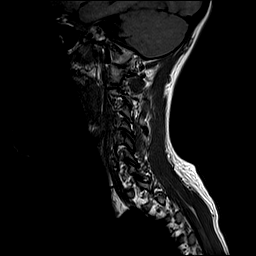
[im 6/17]
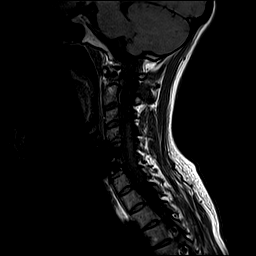
[im 9/17]
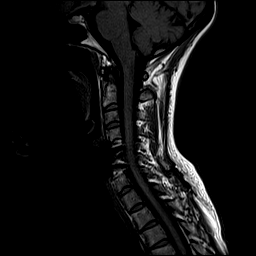
[im 11/17]
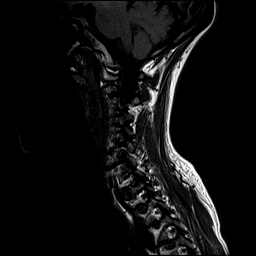
[im 14/17]
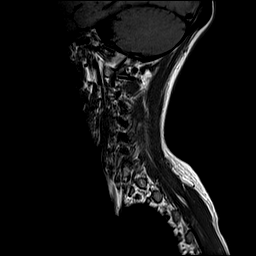

[Series 7: T2 · axial · 3.0mm · 0.70mm/px · z∈[-71,+24]mm · 11 of 27 slices shown (3 of 3)]
[im 1/27]
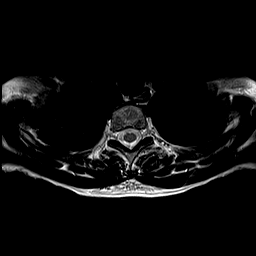
[im 3/27]
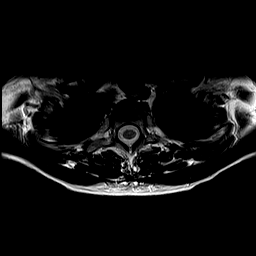
[im 6/27]
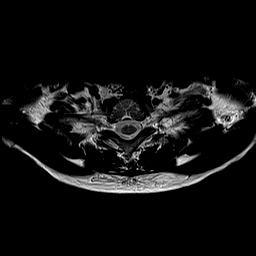
[im 8/27]
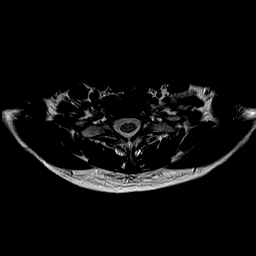
[im 11/27]
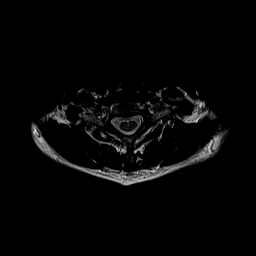
[im 14/27]
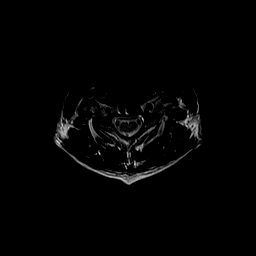
[im 16/27]
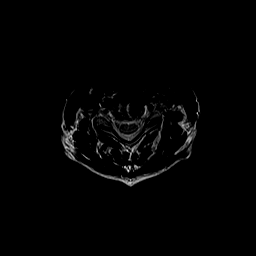
[im 19/27]
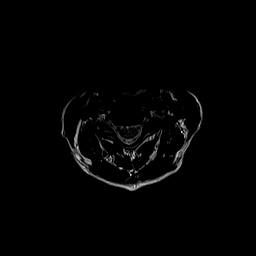
[im 21/27]
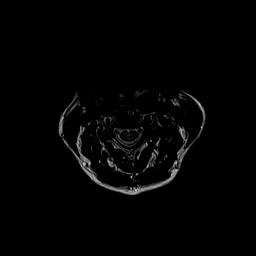
[im 24/27]
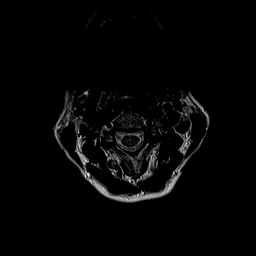
[im 27/27]
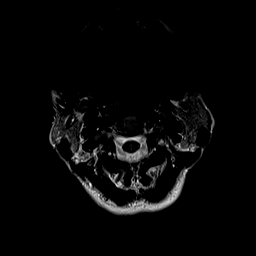

[30 of 48 positions shown; findings below may reference images not displayed]

FINDINGS: Alignment: No significant spondylolisthesis.

Vertebrae: Cp artifact arising from ACDF hardware at the C5-C6
level. No appreciable significant marrow edema or focal suspicious
osseous lesion.

Cord: No signal abnormality identified within the cervical spinal
cord.

Posterior Fossa, vertebral arteries, paraspinal tissues: No
abnormality identified within included portions of the posterior
fossa. Flow voids preserved within the imaged cervical vertebral
arteries. Paraspinal soft tissues unremarkable.

Disc levels:

Unless otherwise stated, the level by level findings below have not
significantly changed from the prior MRI of 08/01/2018.

Mild-to-moderate disc degeneration at C4-C5 and C6-C7. No more than
mild disc degeneration at the remaining non operative levels.

C2-C3: No significant disc herniation or stenosis.

C3-C4: No significant disc herniation or stenosis.

C4-C5: Shallow disc bulge. Minimal facet arthrosis (predominantly on
the left). No significant spinal canal or foraminal stenosis.

C5-C6: Prior ACDF. No significant spinal canal or foraminal
stenosis.

C6-C7: Shallow broad-based central disc protrusion. Minimal partial
effacement of the ventral thecal sac (without spinal cord mass
effect). No significant foraminal stenosis.

C7-T1: Minimal facet arthrosis. No significant disc herniation or
stenosis.
IMPRESSION: Prior C5-C6 ACDF. No residual/recurrent spinal canal or foraminal
stenosis at this level.

Cervical spondylosis, as outlined and not significantly changed from
the prior MRI of 08/01/2018. Findings are most notably as follows.

At C6-C7, there is mild-to-moderate disc degeneration. Shallow
broad-based central disc protrusion. The disc protrusion minimally
effaces the ventral thecal sac (without spinal cord mass effect). No
significant foraminal stenosis.

At C4-C5, there is mild-to-moderate disc degeneration. Shallow disc
bulge. Minimal facet arthrosis (predominantly on the left). No
significant spinal canal or foraminal stenosis.

Minimal facet arthrosis also present at C7-T1.

## 2023-12-12 MED ORDER — OMALIZUMAB 300 MG/2 ML SUBCUTANEOUS SYRINGE
SUBCUTANEOUS | 6 refills | 28.00000 days
Start: 2023-12-12 — End: ?

## 2023-12-12 MED ORDER — XOLAIR 300 MG/2 ML SUBCUTANEOUS SYRINGE
SUBCUTANEOUS | 6 refills | 0.00000 days
Start: 2023-12-12 — End: ?

## 2023-12-12 NOTE — Progress Notes (Signed)
 [facility] Pharm[facility] Refill Coordination Note    Natasha Copeland, DOB: 1965-10-15  Phone: [phone] (home)       All above HIPAA information was verified with patient.         12/11/2023     1:56 PM   Specialty Rx Medication Refill Questionnaire   Which Medications would you like refilled and shipped? Xolair  (have none on hand)   Please list all current allergies: Doxepin   Have you missed any doses in the last 30 days? No   Have you had any changes to your medication(s) since your last refill? No   How much of each medication do you have remaining at home? (eg. number of tablets, injections, etc.) 0   If receiving an injectable medication, next injection date is 12/25/2023   Have you experienced any side effects in the last 30 days? No   Please enter the full address (street address, city, state, zip code) where you would like your medication(s) to be delivered to. [address] [place], [place] [address]   Please specify on which day you would like your medication(s) to arrive. Note: if you need your medication(s) within 3 days, please call the pharmacy to schedule your order at [phone]  12/21/2023   Has your insurance changed since your last refill? No   Would you like a pharmacist to call you to discuss your medication(s)? No   Do you require a signature for your package? (Note: if we are billing Medicare Part B or your order contains a controlled substance, we will require a signature) No   I have been provided my out of pocket cost for my medication and approve the pharmacy to charge the amount to my credit card on file. Yes         Completed refill call assessment today to schedule patient's medication shipment from the [facility] [phone]).  All relevant notes have been reviewed.       Confirmed patient received a Conservation Officer, Historic Buildings and a Surveyor, Mining with first shipment. The patient will receive a drug information handout for each medication shipped and additional FDA Medication Guides as required.         REFERRAL TO PHARMACIST     Referral to the pharmacist: Not needed      [facility]     Shipping address confirmed in Epic.     Delivery Scheduled: Yes, Expected medication delivery date: 12/21/23.     Medication will be delivered via UPS to the prescription address in Epic WAM.    Kyra Myron   [facility] Specialty Technician

## 2023-12-19 ENCOUNTER — Encounter: Admitting: Physical Therapy

## 2023-12-20 MED FILL — XOLAIR 300 MG/2 ML SUBCUTANEOUS SYRINGE: SUBCUTANEOUS | 28 days supply | Qty: 2 | Fill #0

## 2023-12-26 ENCOUNTER — Ambulatory Visit: Admitting: Sports Medicine

## 2023-12-26 NOTE — Therapy (Unsigned)
 OUTPATIENT PHYSICAL THERAPY RENEWAL  Patient Name: Marissa [name] MRN: [id number] DOB:11/17/1965, 58 y.o., female Today's Date: 12/26/2023  END OF SESSION:        Past Medical History:  Diagnosis Date   Allergy    Anemia 10/24   Thought to be related to NSAID   Arthritis    Asthma    Ehlers-Danlos syndrome    GERD (gastroesophageal reflux disease)    History of pituitary tumor    Ocular migraine    Past Surgical History:  Procedure Laterality Date   KNEE SURGERY N/A 01/17/2010   SHOULDER SURGERY N/A 01/18/2007   SPINE SURGERY     C5-6 fusion   UTERINE FIBROID SURGERY N/A    Patient Active Problem List   Diagnosis Date Noted   Vitamin D  deficiency 06/20/2023   Radicular pain in right arm 06/20/2023   Subluxation 04/07/2022   Right hip subluxation, sequela 02/15/2022   Asthma 11/11/2021   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   Finger pain, right 07/15/2021   Knee pain, left 04/25/2019   Bicipital tendonitis of left shoulder 04/25/2019   Varicose veins of bilateral lower extremities with other complications 10/25/2018   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   POTS (postural orthostatic tachycardia syndrome) 08/29/2017   Chronic pain syndrome 08/29/2017   Chronic insomnia 08/29/2017   Wrist pain, acute 06/02/2016   Enthesopathy of right shoulder 01/28/2016   Right shoulder pain 01/28/2016   Family history of early CAD 11/30/2015   Cervical disc disorder with radiculopathy of cervical region 05/13/2014   Chronic right SI joint pain 08/28/2013   Hip pain, right 06/25/2013   Elbow pain, right 02/13/2013   Bunionette 01/07/2013   Ehlers-Danlos disease 11/08/2012   Unspecified visual disturbance 04/19/2012   Pituitary adenoma with extrasellar extension (HCC) 04/19/2012    PCP: Theophilus Andrews, Tully MD   REFERRING PROVIDER: Harvey Seltzer MD   REFERRING DIAG: Mertha Bullock Syndrome, Cervical disc disorder with radiculopathy  Rationale for Evaluation and  Treatment: Rehabilitation  THERAPY DIAG:  No diagnosis found.  ONSET DATE: Chronic with exacerbation  SUBJECTIVE:                                                                                                                                                                                           SUBJECTIVE STATEMENT: I have more full body pain which has been ongoing the past few weeks. Maybe because my arm is a bit better. L shoulder is more than the other and my weird.    This patient presents for physical therapy evaluation for hypermobile Ehlers-Danlos syndrome with complex history and multisystem involvement. Most recently she  has had cervical radiculopathy on the Rt side.  Pain became severe in April, noticed pain in Rt elbow as well.  The sx were a bit different than previous L sided pain from similar issue. She had difficulty gripping due to pain.  Her usual ways to manage her sx  did not help.  Saw Dr. Harvey. She had shockwave.  Finally she had an MRI (see below) . Over the years she has tried many things for her pain-  including private Pilates sessions, soft tissue work/massage , dry needling all without lasting relief.  Rt elbow pain may be a separate issue. It is hard to get comfortable at night.  She went to [facility] for a cervical injection which she reports 5 or 6 days of relief.  At that time she reports it was 50% improved but the initial benefit is gone.  The doctor wants her to try PT because she so unstable before doing another injection.  She reports the doctor does not think that another injection be harmful but she will not likely get full relief. Due to the severe pain she reports she has become more deconditioned overall but this is also due to other GI issues and mast cell activation.     PERTINENT HISTORY:  Cervical fusion L4-5 2016. POTS dysautonomia, spontaneous rupture of pectoralis right side Multijoint pain Ehlers-Danlos syndrome Chronic  pain GI issues   Mast cell hypersensitivity and skin issues       Relevant historical information:     PAIN:  Are you having pain? Yes: NPRS scale: 3/10 Pain location: Rt arm  Pain description: Radiating, tightness, elbow is sharp and localized  Aggravating factors: Activity? Relieving factors: Valium  helpful for muscle relaxer, ice/heat  NO KT TAPE!! , Lidoacaine patch but does have skin reactions as a result  Lt. 6/10  PRECAUTIONS: Other: instability  ,  NO KT TAPE!! , Lidoacaine patch but does have skin reactions as a result  RED FLAGS: None    WEIGHT BEARING RESTRICTIONS: No  FALLS:  Has patient fallen in last 6 months? No  LIVING ENVIRONMENT: Lives with: lives with their family and lives alone Lives in: House/apartment Stairs: NT  Has following equipment at home: Patient has multiple braces including a soft collar and a hard collar for her neck  OCCUPATION: Works as a ESTATE MANAGER/LAND AGENT- in [employer]   PLOF: Independent  PATIENT GOALS: Pain relief   NEXT MD VISIT: as needed   OBJECTIVE:  Note: Objective measures were completed at Evaluation unless otherwise noted.  DIAGNOSTIC FINDINGS:  MRI 07/22/23: IMPRESSION: 1. Prior ACDF at C5-6 without residual or recurrent stenosis. 2. Right paracentral disc protrusion at C6-7 without significant stenosis. The ventral right C7 nerve root could be affected. 3. Mild disc bulge with uncovertebral spurring at C4-5 with resultant mild right C5 foraminal stenosis.  PATIENT SURVEYS:  NDI:  NECK DISABILITY INDEX  Date: 11/09/23 Score  Pain intensity 3  2. Personal care (washing, dressing, etc.) 1 =  I can look after myself normally but it causes extra pain  3. Lifting 3 = Pain prevents me from lifting heavy weights but I can manage light to medium   weights if they are conveniently positioned  4. Reading 3 = I can't read as much as I want because of moderate pain in my neck  5. Headaches 3 = I have moderate  headaches, which come frequently  6. Concentration 1 =  I can concentrate fully when  I want to with slight difficulty   7. Work 2 = I can do most of my usual work, but no more  8. Driving 2 =  I can drive my car as long as I want with moderate pain in my neck  9. Sleeping 4 = My sleep is greatly disturbed (3-5 hrs sleepless)   10. Recreation 3 = I am able to engage in a few of my usual recreation activities because of pain in   my neck  Total 28/50    Minimum Detectable Change (90% confidence): 5 points or 10% points  COGNITION: Overall cognitive status: Within functional limits for tasks assessed     SENSATION: Squeezing sensation but not numb or tingling    POSTURE: pt with overcorrected posture, decreased lordosis , tonic upper traps   PALPATION: Rt upper trap tension, knotted throughout   CERVICAL ROM:  Cervical Fusion 4-5: 9 yrs ago   Active ROM A/PROM (deg) 12/26/2023 AROM 12/06/23  Flexion 70 70  Extension 70 P 70  Right lateral flexion 58 60+  Left lateral flexion 60 60+  Right rotation [facility] WFL, hyper   Left rotation WFL  WFL, hyper   (Blank rows = not tested)  UE ROM: WFL   UE MMT: 4+/5 with pain on Rt UE  12/06/23: WFL in shoulder flex/abd 4+/5         Grip strength 43# 34#   (Blank rows = not tested)  CERVICAL SPECIAL TESTS:  Neck flexor muscle endurance test: 12 sec   FUNCTIONAL TESTS:  NT   GAIT:  TREATMENT DATE:    [facility] Adult PT Treatment:                                                DATE: 12/26/23 Therapeutic Exercise: *** Manual Therapy: *** Neuromuscular re-ed: *** Therapeutic Activity: *** Modalities: *** Self Care: ***  [facility] Adult PT Treatment:                                                DATE: 12/06/23 Therapeutic Exercise: Recumbent bike 6 min L1 for conditioning  Manual Therapy: Gentle suboccipital release  facilitated chin tuck Therapeutic Activity: Standing green Thera-Band row and extension , modified to red  band for improved shoulder position  Standing looped yellow band for postural exercises and shoulder stability  Supine chest pull  Bridging  x 10  Added horiz abd yellow band pull to iso bridge  Self care:  Discussion about goals plan of care and upcoming trip to [place] which will require her to travel, walk, carry and lift items.  She is understandably quite anxious and uneasy about it and she has to work 2 days after she returns and there are a lot of things out of her control while on the trip.    [facility] Adult PT Treatment:                                                DATE: 11/29/23 Manual Therapy: Bilateral posterior cervicals and levator scap.upper trap in and supine , trigger point release  Gentle light cervical release (not traction) and suboccipital friction Therapeutic Activity: Standing foam roller (horizontal) glides  Standing (longitudinal) red looped band ER Serratus Flexion arms overhead red band  Sidelying scaption red band  x 12, 2nd set with 2 lbs wgt.   ER 2 lbs  x 15  Supine 3 lbs narrow press x 15 and triceps  x 10   [facility] Adult PT Treatment:                                                DATE: 11/27/23 Manual Therapy: Done after NM Re-ed Bilateral posterior cervicals and levator scap.upper trap in both prone and supine  Gentle light cervical release (not traction) and suboccipital friction Neuromuscular re-ed: Standing foam roller exercises Shoulder flexon AAROM dowel  Red looped band 0-90 deg  ER to chest press 90 deg  Scapular retraction x 10 Shoulder extension  Goal post with scapular retraction/ER x 10  Goal post x 10   [facility] Adult PT Treatment:                                                DATE: 11/22/23 Neuromuscular re-ed: Ball under sacrum Supine dowel OH with tactile cues for GH head  ER isometric with Pilates circle 2 x 5 sec  IR isometric as above  LE femur arc UE arm arcs Dead bug variations Gentle suboccipital release   Self  Care: Scarf wrap, techniques for reducing stress and tension, breathing    [facility] Adult PT Treatment:                                                DATE: 11/20/23 Manual Therapy: Soft tissue work, trigger point therapy to bilateral sternocleidomastoid anterior and posterior scalenes pectorals Light pin and stretch to the anterior shoulder and chest Gentle suboccipital work Neuromuscular re-ed: Supine chin tuck arms overhead  DNF arms OH 5 sec  ER yellow loop isometric ER iso with chest press  Serratus lift yellow  OH lift  Self Care: Problem solving for neck and UE tension 15 min  [facility] Adult PT Treatment:                                                DATE: 11/16/23 Neuromuscular reeducation Soft tissue work, trigger point therapy bilateral Rt upper traps , levator scap Suboccipital release and gentle friction Yellow band (looped) ER, chest press, OH lift  Cervical isometrics with ball as alternative     [facility] Adult PT Treatment:                                                DATE: 11/09/23  Self Care: PT, plan of care, isometrics,Home exercise program, reinforced neutral and muscle support over Gripping, nervous system role in tension, pain  PATIENT EDUCATION:  Education details: See above posture, alignment, neutral zone and joint protection PNE/Explain pain   Joint inflammation/collagen/ligament laxity, muscular support Stretching vs stabilizing  Person educated: Patient Education method: Explanation, Demonstration, and Verbal cues Education comprehension: verbalized understanding and needs further education   HOME EXERCISE PROGRAM: Access Code: [email or web address] URL: [email or web address] Date: 11/09/2023 Prepared by: Delon Norma  Access Code: [email or web address] URL: [email or web address] Date: 11/16/2023 Prepared by: Delon Norma  Exercises - Standing Isometric Cervical Sidebending with Manual Resistance  - 1 x daily - 7 x weekly - 2 sets - 10 reps - 5 hold - Standing Isometric Cervical Flexion with Manual Resistance  - 1 x daily - 7 x weekly - 1-2 sets - 10 reps - 5 hold - Seated Isometric Cervical Rotation  - 1 x daily - 7 x weekly - 1-2 sets - 10 reps - 5 hold - Standing Isometric Cervical Extension with Manual Resistance  - 1 x daily - 7 x weekly - 1-2 sets - 10 reps - 5 hold - Seated Cervical Rotation AROM  - 2-3 x daily - 7 x weekly - 1 sets - 30 hold - Standing Shoulder External Rotation with Resistance  - 1 x daily - 7 x weekly - 2 sets - 10 reps - 5 hold - Shoulder Flexion Serratus Activation with Resistance  - 1 x daily - 7 x weekly - 2 sets - 10 reps - 5 hold   ASSESSMENT:   CLINICAL IMPRESSION: Marissa [name] has completed 7 physical therapy sessions which have included joint positioning postural stability and manual therapy.  She continues to have pain in her neck but much improvement in her right arm symptoms.  She notices her left shoulder is painful.  Recently she has noted increased global pain in her body not only upper but hips and legs as well.  Some of this she questions might just be from aging and a hypermobile body.  She may consider reaching out regarding medications to address this.  Patient would like to continue PT to address fatigue and deconditioning which may be related to her chronic issues.  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  When she has these all day events she notes 2 days after she is completely wiped out.  While this is part of her chronic health condition it is something that she could improve with gradual progression and symptom tracking.  She did improve her NDI score but only by 2 points (26/50).  She will continue to benefit from skilled therapy to achieve her goals and a realistic but consistent manner.   Her progress has  been gradual but she has been able to tolerate gentle strengthening exercises without excessive rebound pain.    OBJECTIVE IMPAIRMENTS: decreased activity tolerance, decreased endurance, decreased strength, increased fascial restrictions, impaired tone, impaired UE functional use, postural dysfunction, pain, and joint hypermobility.   ACTIVITY LIMITATIONS: carrying, lifting, sitting, standing, sleeping, bathing, dressing, reach over head, hygiene/grooming, and locomotion level  PARTICIPATION LIMITATIONS: cleaning, interpersonal relationship, shopping, community activity, and occupation  PERSONAL FACTORS: Past/current experiences, Time since onset of injury/illness/exacerbation, and 3+ comorbidities: Genetic condition, MCAS, POTS and previous cervical fusion are also affecting patient's functional outcome.   REHAB and headaches: Good  CLINICAL DECISION MAKING: Unstable/unpredictable  EVALUATION COMPLEXITY: High   GOALS: Goals reviewed with patient? Yes  SHORT TERM GOALS= LONG TERM GOALS:  Target date: 12/21/2023   Patient will be independent with  final HEP upon discharge from PT and report consistent benefit following exercise completion.    Baseline:  Goal status:ongoing  2.  Patient will be I with concepts of joint protection and stability as it pertains to joint hypermobility.  Baseline:  Goal status: ongoing   3.  Patient will be able to demonstrate proper posture and lifting techniques related to spine health and reduction of symptoms.   Baseline:  Goal status:ongoing   4.  Pt will be able to report Rt arm symptoms improved by 25%  Baseline:  Goal status:ongoing   5.  Pt will report Rt elbow pain reduced to mild to allow for improved sleep/rest.  Baseline:  Goal status: ongoing , more sporatic  6. Patient will be able to walk or use bike for 15 min without increased neck pain .    Baseline: consistent pain with any walking and this  frustrates her    Goal status: NEW     7.  Pt will be able to feel more confident in her positioning during HEP and activity as a proxy for proprioception.    Baseline: she commonly doubts shoulder position    Goal status: NEW  PLAN:  PT FREQUENCY: 1-2x/week  PT DURATION: 6 weeks  PLANNED INTERVENTIONS: 97164- PT Re-evaluation, 97750- Physical Performance Testing, 97110-Therapeutic exercises, 97530- Therapeutic activity, [id number]- Neuromuscular re-education, 97535- Self Care, [id number]- Manual therapy, Patient/Family education, Balance training, Cryotherapy, and Moist heat.  PLAN FOR NEXT SESSION: standing scap, posture, cervical isometrics, gentle shouder A/AROM, manual    Arlette Schaad, PT 12/26/2023, 3:06 PM   Delon Norma, PT 12/26/23 3:06 PM Phone: [phone] Fax: [phone]

## 2023-12-27 ENCOUNTER — Encounter: Payer: Self-pay | Admitting: Physical Therapy

## 2023-12-27 ENCOUNTER — Ambulatory Visit (INDEPENDENT_AMBULATORY_CARE_PROVIDER_SITE_OTHER): Admitting: Physical Therapy

## 2023-12-27 DIAGNOSIS — M5412 Radiculopathy, cervical region: Secondary | ICD-10-CM

## 2023-12-27 DIAGNOSIS — Q796 Ehlers-Danlos syndrome, unspecified: Secondary | ICD-10-CM | POA: Diagnosis not present

## 2023-12-29 NOTE — Therapy (Deleted)
 OUTPATIENT PHYSICAL THERAPY NOTE   Patient Name: Marissa [name] MRN: [id number] DOB:30-Dec-1965, 58 y.o., female Today's Date: 12/29/2023  END OF SESSION:         Past Medical History:  Diagnosis Date   Allergy    Anemia 10/24   Thought to be related to NSAID   Arthritis    Asthma    Ehlers-Danlos syndrome    GERD (gastroesophageal reflux disease)    History of pituitary tumor    Ocular migraine    Past Surgical History:  Procedure Laterality Date   KNEE SURGERY N/A 01/17/2010   SHOULDER SURGERY N/A 01/18/2007   SPINE SURGERY     C5-6 fusion   UTERINE FIBROID SURGERY N/A    Patient Active Problem List   Diagnosis Date Noted   Vitamin D  deficiency 06/20/2023   Radicular pain in right arm 06/20/2023   Subluxation 04/07/2022   Right hip subluxation, sequela 02/15/2022   Asthma 11/11/2021   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   Finger pain, right 07/15/2021   Knee pain, left 04/25/2019   Bicipital tendonitis of left shoulder 04/25/2019   Varicose veins of bilateral lower extremities with other complications 10/25/2018   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   POTS (postural orthostatic tachycardia syndrome) 08/29/2017   Chronic pain syndrome 08/29/2017   Chronic insomnia 08/29/2017   Wrist pain, acute 06/02/2016   Enthesopathy of right shoulder 01/28/2016   Right shoulder pain 01/28/2016   Family history of early CAD 11/30/2015   Cervical disc disorder with radiculopathy of cervical region 05/13/2014   Chronic right SI joint pain 08/28/2013   Hip pain, right 06/25/2013   Elbow pain, right 02/13/2013   Bunionette 01/07/2013   Ehlers-Danlos disease 11/08/2012   Unspecified visual disturbance 04/19/2012   Pituitary adenoma with extrasellar extension (HCC) 04/19/2012    PCP: Theophilus Andrews, Tully MD   REFERRING PROVIDER: Harvey Seltzer MD   REFERRING DIAG: Mertha Bullock Syndrome, Cervical disc disorder with radiculopathy  Rationale for Evaluation and  Treatment: Rehabilitation  THERAPY DIAG:  No diagnosis found.  ONSET DATE: Chronic with exacerbation  SUBJECTIVE:                                                                                                                                                                                           SUBJECTIVE STATEMENT:  Patient has felt extreme muscle and joint soreness, achiness for a few weeks.  She cannot figure out why.   It's almost like I have the flu but I'm not sick Reports she stopped taking her birth control which she has used for 40 yrs and so maybe that had something  to do with it.    This patient presents for physical therapy evaluation for hypermobile Ehlers-Danlos syndrome with complex history and multisystem involvement. Most recently she has had cervical radiculopathy on the Rt side.  Pain became severe in April, noticed pain in Rt elbow as well.  The sx were a bit different than previous L sided pain from similar issue. She had difficulty gripping due to pain.  Her usual ways to manage her sx  did not help.  Saw Dr. Harvey. She had shockwave.  Finally she had an MRI (see below) . Over the years she has tried many things for her pain-  including private Pilates sessions, soft tissue work/massage , dry needling all without lasting relief.  Rt elbow pain may be a separate issue. It is hard to get comfortable at night.  She went to [facility] for a cervical injection which she reports 5 or 6 days of relief.  At that time she reports it was 50% improved but the initial benefit is gone.  The doctor wants her to try PT because she so unstable before doing another injection.  She reports the doctor does not think that another injection be harmful but she will not likely get full relief. Due to the severe pain she reports she has become more deconditioned overall but this is also due to other GI issues and mast cell activation.     PERTINENT HISTORY:  Cervical fusion L4-5  2016. POTS dysautonomia, spontaneous rupture of pectoralis right side Multijoint pain Ehlers-Danlos syndrome Chronic pain GI issues   Mast cell hypersensitivity and skin issues       Relevant historical information:     PAIN:  Are you having pain? Yes: NPRS scale: 3/10 Pain location: Rt arm  Pain description: Radiating, tightness, elbow is sharp and localized  Aggravating factors: Activity? Relieving factors: Valium  helpful for muscle relaxer, ice/heat  NO KT TAPE!! , Lidoacaine patch but does have skin reactions as a result  Rt hip pain is pretty bad right now, 6/10-7/10   PRECAUTIONS: Other: instability  ,  NO KT TAPE!! , Lidoacaine patch but does have skin reactions as a result  RED FLAGS: None    WEIGHT BEARING RESTRICTIONS: No  FALLS:  Has patient fallen in last 6 months? No  LIVING ENVIRONMENT: Lives with: lives with their family and lives alone Lives in: House/apartment Stairs: NT  Has following equipment at home: Patient has multiple braces including a soft collar and a hard collar for her neck  OCCUPATION: Works as a ESTATE MANAGER/LAND AGENT- in [employer]   PLOF: Independent  PATIENT GOALS: Pain relief   NEXT MD VISIT: as needed   OBJECTIVE:  Note: Objective measures were completed at Evaluation unless otherwise noted.  DIAGNOSTIC FINDINGS:  MRI 07/22/23: IMPRESSION: 1. Prior ACDF at C5-6 without residual or recurrent stenosis. 2. Right paracentral disc protrusion at C6-7 without significant stenosis. The ventral right C7 nerve root could be affected. 3. Mild disc bulge with uncovertebral spurring at C4-5 with resultant mild right C5 foraminal stenosis.  PATIENT SURVEYS:  NDI:  Total 28/50    Minimum Detectable Change (90% confidence): 5 points or 10% points  COGNITION: Overall cognitive status: Within functional limits for tasks assessed     SENSATION: Squeezing sensation but not numb or tingling    POSTURE: pt with overcorrected  posture, decreased lordosis , tonic upper traps   PALPATION: Rt upper trap tension, knotted throughout   CERVICAL ROM:  Cervical  Fusion 4-5: 9 yrs ago   Active ROM A/PROM (deg) 12/29/2023 AROM 12/06/23  Flexion 70 70  Extension 70 P 70  Right lateral flexion 58 60+  Left lateral flexion 60 60+  Right rotation [facility] WFL, hyper   Left rotation WFL  WFL, hyper   (Blank rows = not tested)  UE ROM: WFL   UE MMT: 4+/5 with pain on Rt UE  12/06/23: WFL in shoulder flex/abd 4+/5         Grip strength 43# 34#   (Blank rows = not tested)  CERVICAL SPECIAL TESTS:  Neck flexor muscle endurance test: 12 sec   FUNCTIONAL TESTS:  NT   GAIT:  TREATMENT DATE:   [facility] Adult PT Treatment:                                                DATE: 01/01/24 Therapeutic Exercise: *** Manual Therapy: *** Neuromuscular re-ed: *** Therapeutic Activity: *** Modalities: *** Self Care: ***  [facility] Adult PT Treatment:                                                DATE: 12/26/23 Therapeutic Exercise: LTR with head rotation Yellow looped band: ER, added chest press and overhead  Sidelying IR with ball between knees Clam  TFL stretch, brief  Therapeutic Activity: Mod quadruped Cat/camel Neutral spine  Alt arm reaches  Hip extension  Manual: prone and sidelying Rt hip/pelvis, soft tissue to Rt glute and TFL, elongation stretch on L side.    [facility] Adult PT Treatment:                                                DATE: 12/06/23 Therapeutic Exercise: Recumbent bike 6 min L1 for conditioning  Manual Therapy: Gentle suboccipital release  facilitated chin tuck Therapeutic Activity: Standing green Thera-Band row and extension , modified to red band for improved shoulder position  Standing looped yellow band for postural exercises and shoulder stability  Supine chest pull  Bridging  x 10  Added horiz abd yellow band pull to iso bridge  Self care:  Discussion about goals plan of care  and upcoming trip to [place] which will require her to travel, walk, carry and lift items.  She is understandably quite anxious and uneasy about it and she has to work 2 days after she returns and there are a lot of things out of her control while on the trip.      PATIENT EDUCATION:  Education details: See above posture, alignment, neutral zone and joint protection PNE/Explain pain   Joint inflammation/collagen/ligament laxity, muscular support Stretching vs stabilizing  Person educated: Patient Education method: Explanation, Demonstration, and Verbal cues Education comprehension: verbalized understanding and needs further education   HOME EXERCISE PROGRAM:  Access Code: [email or web address] URL: [email or web address] Date: 11/16/2023 Prepared by: Delon Norma  Exercises - Standing Isometric Cervical Sidebending with Manual Resistance  - 1 x daily - 7 x weekly - 2 sets - 10 reps - 5 hold - Standing Isometric Cervical Flexion with Manual Resistance  - 1 x daily -  7 x weekly - 1-2 sets - 10 reps - 5 hold - Seated Isometric Cervical Rotation  - 1 x daily - 7 x weekly - 1-2 sets - 10 reps - 5 hold - Standing Isometric Cervical Extension with Manual Resistance  - 1 x daily - 7 x weekly - 1-2 sets - 10 reps - 5 hold - Seated Cervical Rotation AROM  - 2-3 x daily - 7 x weekly - 1 sets - 30 hold - Standing Shoulder External Rotation with Resistance  - 1 x daily - 7 x weekly - 2 sets - 10 reps - 5 hold - Shoulder Flexion Serratus Activation with Resistance  - 1 x daily - 7 x weekly - 2 sets - 10 reps - 5 hold   ASSESSMENT:   CLINICAL IMPRESSION: Patient returns after a few weeks out from PT, was travelling. She is overall having more generalized body pain which is not very typical for her.  She plans to address this with Dr. Harvey next visit. Worked today on the most acute pain she had which was along her Rt pelvis/TFL and post glute.  She has a lengthy presentation tomorrow and is  not looking for ward to the standing she has to do.  She was able to do some light exercise today but with pain.  Patient will continue to benefit from skilled PT in order to return to PLOF and optimize functional mobility.      Hajer has completed 7 physical therapy sessions which have included joint positioning postural stability and manual therapy.  She continues to have pain in her neck but much improvement in her right arm symptoms.  She notices her left shoulder is painful.  Recently she has noted increased global pain in her body not only upper but hips and legs as well.  Some of this she questions might just be from aging and a hypermobile body.  She may consider reaching out regarding medications to address this.  Patient would like to continue PT to address fatigue and deconditioning which may be related to her chronic issues.  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  When she has these all day events she notes 2 days after she is completely wiped out.  While this is part of her chronic health condition it is something that she could improve with gradual progression and symptom tracking.  She did improve her NDI score but only by 2 points (26/50).  She will continue to benefit from skilled therapy to achieve her goals and a realistic but consistent manner.   Her progress has been gradual but she has been able to tolerate gentle strengthening exercises without excessive rebound pain.    OBJECTIVE IMPAIRMENTS: decreased activity tolerance, decreased endurance, decreased strength, increased fascial restrictions, impaired tone, impaired UE functional use, postural dysfunction, pain, and joint hypermobility.   ACTIVITY LIMITATIONS: carrying, lifting, sitting, standing, sleeping, bathing, dressing, reach over head, hygiene/grooming, and locomotion level  PARTICIPATION LIMITATIONS: cleaning, interpersonal relationship, shopping, community  activity, and occupation  PERSONAL FACTORS: Past/current experiences, Time since onset of injury/illness/exacerbation, and 3+ comorbidities: Genetic condition, MCAS, POTS and previous cervical fusion are also affecting patient's functional outcome.   REHAB and headaches: Good  CLINICAL DECISION MAKING: Unstable/unpredictable  EVALUATION COMPLEXITY: High   GOALS: Goals reviewed with patient? Yes  SHORT TERM GOALS= LONG TERM GOALS:  Target date: 12/21/2023   Patient will be independent with final HEP upon  discharge from PT and report consistent benefit following exercise completion.    Baseline:  Goal status:ongoing  2.  Patient will be I with concepts of joint protection and stability as it pertains to joint hypermobility.  Baseline:  Goal status: ongoing   3.  Patient will be able to demonstrate proper posture and lifting techniques related to spine health and reduction of symptoms.   Baseline:  Goal status:ongoing   4.  Pt will be able to report Rt arm symptoms improved by 25%  Baseline:  Goal status:ongoing   5.  Pt will report Rt elbow pain reduced to mild to allow for improved sleep/rest.  Baseline:  Goal status: ongoing , more sporatic  6. Patient will be able to walk or use bike for 15 min without increased neck pain .    Baseline: consistent pain with any walking and this  frustrates her    Goal status: NEW    7.  Pt will be able to feel more confident in her positioning during HEP and activity as a proxy for proprioception.    Baseline: she commonly doubts shoulder position    Goal status: NEW  PLAN:  PT FREQUENCY: 1-2x/week  PT DURATION: 6 weeks  PLANNED INTERVENTIONS: 97164- PT Re-evaluation, 97750- Physical Performance Testing, 97110-Therapeutic exercises, 97530- Therapeutic activity, [id number]- Neuromuscular re-education, 97535- Self Care, [id number]- Manual therapy, Patient/Family education, Balance training, Cryotherapy, and Moist heat.  PLAN FOR NEXT SESSION:  ask for more time/visits?  standing scap, posture, cervical isometrics, gentle shouder A/AROM, manual    Niyonna Betsill, PT 12/29/2023, 8:42 AM   Delon Norma, PT 12/29/2023 8:42 AM Phone: [phone] Fax: [phone]

## 2024-01-01 ENCOUNTER — Encounter: Admitting: Physical Therapy

## 2024-01-02 ENCOUNTER — Ambulatory Visit: Admitting: Sports Medicine

## 2024-01-02 VITALS — BP 108/68 | Ht <= 58 in | Wt 95.0 lb

## 2024-01-02 DIAGNOSIS — Q796 Ehlers-Danlos syndrome, unspecified: Secondary | ICD-10-CM

## 2024-01-02 DIAGNOSIS — G8929 Other chronic pain: Secondary | ICD-10-CM

## 2024-01-02 DIAGNOSIS — M533 Sacrococcygeal disorders, not elsewhere classified: Secondary | ICD-10-CM | POA: Diagnosis not present

## 2024-01-02 MED ORDER — METHYLPREDNISOLONE ACETATE 40 MG/ML IJ SUSP
40.0000 mg | Freq: Once | INTRAMUSCULAR | Status: AC
  Administered 2024-01-02: 10:00:00 40 mg via INTRA_ARTICULAR

## 2024-01-02 NOTE — Assessment & Plan Note (Signed)
 While the estrogen withdrawal may have worsened some of her musculoskeletal symptoms I am not sure that this is not just a part of the normal painful myalgias she has had with her Ehlers-Danlos If she feels stable from a GI standpoint I think she should try resuming the low-dose naltrexone therapy In the past this was helpful for her chronic musculoskeletal pain

## 2024-01-02 NOTE — Assessment & Plan Note (Signed)
 She has responded to corticosteroid injection in the past and we will try this again today  Procedure:  Injection of right SI joint Consent obtained and verified. Time-out conducted. Noted no overlying erythema, induration, or other signs of local infection. Skin prepped in a sterile fashion. Topical analgesic spray: Ethyl chloride. Completed without difficulty. Meds: 1 cc of Solu-Medrol  40 and 4 cc of lidocaine 1% Advised to call if fevers/chills, erythema, induration, drainage, or persistent bleeding.  Continue some easy motion for her right SI joint Hopefully she will get a good response to the injection again We will recheck in 1 month

## 2024-01-02 NOTE — Progress Notes (Signed)
 Discussed the use of AI scribe software for clinical note transcription with the patient, who gave verbal consent to proceed.  History of Present Illness Marissa [name] is a 58 year old female with Ehlers-Danlos syndrome who presents with worsening diffuse joint pain and musculoskeletal symptoms following cessation of long-term estrogen therapy.  Diffuse myalgias and arthralgias - Worsening diffuse joint pain and musculoskeletal symptoms since cessation of long-term estrogen replacement (birth control pills) in mid-October 2025 - Myalgias described as like I have the flu - Prominent rib cage discomfort and nocturnal pain - Uncertainty regarding etiology: estrogen withdrawal, recent travel, or Ehlers-Danlos syndrome - Persistent musculoskeletal symptoms since an aborted takeoff incident in [place] over Thanksgiving, during which she was pitched forward in her seat and has not felt right since  Physical therapy and rehabilitation - Continues physical therapy with isometrics, TheraBand exercises, and deep tissue massage - Insurance coverage for physical therapy is limited and may be near the end of authorized sessions - Considering one-on-one Pilates with an instructor experienced in hypermobility Her current physical therapy has been really helpful  Left great toe pain and instability - Severe tenderness and pain at night - Episodes of subluxation with difficulty relocating the joint - Uses a splint for support - Right great toe is less symptomatic and has better range of motion  Left knee pain - Lateral knee pain worsened by stairs, bed transfers, and movement - Lateral joint line tenderness - No patellar instability - Uses a knee sleeve; pressure from the sleeve, rather than the material, causes skin discomfort  Right hip and sacroiliac joint pain - Ongoing right hip and sacroiliac pain - Physical therapy focuses on massage and soft tissue work around the SI joint and ASIS - SI  joint brace trialed but caused additional problems  Medication intolerance and bowel function - Not using anti-inflammatory medications due to gastrointestinal intolerance - Discontinued low dose naltrexone in the summer due to gastrointestinal issues - Taking prucalopride with improved but not baseline bowel function - Mild weight gain since starting prucalopride  Upper extremity pain and radiculopathy - Residual elbow pain and tightness - Prior radiculopathy and right trapezius symptoms have improved - Improved neck motion - Performing gentle upper body exercises  Physical Exam MUSCULOSKELETAL: Thin pleasant female in no acute distress  left great toe with significant motion limitation, 15 flexion, 15 extension, and dorsal spurring limiting motion. Right great toe within normal range of motion.  Left knee without swelling, patella without excessive mobility. Knee flexion hypermobile, painful at terminal range, with 3-5 hyperextension. Lachman test shows laxity with good endpoint. Slight laxity on lateral collateral ligament testing. McMurray test shows lateral joint line pain.  Pelvis rotated anteriorly on right, 2 cm more than left. Right SI joint tighter than left, more fixed with limited motion, and motion significantly decreased compared to left. Fibrous nodules on both SI joints. Sore soft tissue muscles around SI joint and ASIS of right pelvis.  Assessment and Plan Assessment & Plan Right sacroiliac joint dysfunction Chronic dysfunction with recurrent pain and decreased mobility, associated with Ehlers-Danlos syndrome. Fixation and limited motion noted. - Performed right sacroiliac joint injection with 1 cc Solu-Medrol  and 4 cc lidocaine. - Recommended continuation of physical therapy focusing on sacroiliac joint and periarticular soft tissue.  Left knee pain with hypermobility Pain with hypermobility, lateral joint line tenderness, and terminal range pain. Mild laxity noted,  likely due to cartilage irritation and hypermobility from Ehlers-Danlos syndrome. - Recommended use of knee sleeve for  compression and protection as tolerated. - Advised to follow up if pain or instability worsens.  Left great toe osteoarthritis Osteoarthritis with significant motion limitation, dorsal spurring, and nocturnal pain. Joint subluxation and restriction noted. - Recommended Voltaren  gel application to the left great toe three to four times daily. - Provided instructions for self-reduction in case of toe subluxation (apply ice and steady traction).  Ehlers-Danlos syndrome with joint pain and hypermobility Chronic syndrome with generalized joint pain, hypermobility, and recurrent musculoskeletal complaints. Benefits from physical therapy with periodic exacerbations. - Encouraged continuation of physical therapy with periodic revisits as needed. - Discussed potential for individual Pilates instruction with a therapist experienced in hypermobility and encouraged exploration of this option.  Chronic constipation Previously severe, now improved with prucalopride. Reports less complete bowel movements but overall improvement and weight gain. - Recommended continuation of prucalopride therapy.

## 2024-01-03 ENCOUNTER — Encounter: Payer: Self-pay | Admitting: Physical Therapy

## 2024-01-03 ENCOUNTER — Ambulatory Visit: Admitting: Physical Therapy

## 2024-01-03 DIAGNOSIS — Q796 Ehlers-Danlos syndrome, unspecified: Secondary | ICD-10-CM

## 2024-01-03 DIAGNOSIS — M5412 Radiculopathy, cervical region: Secondary | ICD-10-CM

## 2024-01-03 NOTE — Therapy (Signed)
 OUTPATIENT PHYSICAL THERAPY NOTE   Patient Name: Marissa [name] MRN: [id number] DOB:[birth year], 58 y.o., female Today's Date: 01/03/2024  END OF SESSION:  PT End of Session - 01/03/24 1057     Visit Number 9    Number of Visits 15    Date for Recertification  01/31/24    Authorization Type BCBS    Authorization Time Period submitted 10/23, 7 approved, submitted on 12/06/23 for 8 more, approved 4 visits until 12/18 JP    Authorization - Visit Number 2    Authorization - Number of Visits 4    PT Start Time 1100    PT Stop Time 1145    PT Time Calculation (min) 45 min    Activity Tolerance Patient tolerated treatment well    Behavior During Therapy [facility] for tasks assessed/performed                Past Medical History:  Diagnosis Date   Allergy    Anemia 10/24   Thought to be related to NSAID   Arthritis    Asthma    Ehlers-Danlos syndrome    GERD (gastroesophageal reflux disease)    History of pituitary tumor    Ocular migraine    Past Surgical History:  Procedure Laterality Date   KNEE SURGERY N/A 01/17/2010   SHOULDER SURGERY N/A 01/18/2007   SPINE SURGERY     C5-6 fusion   UTERINE FIBROID SURGERY N/A    Patient Active Problem List   Diagnosis Date Noted   Vitamin D  deficiency 06/20/2023   Radicular pain in right arm 06/20/2023   Subluxation 04/07/2022   Right hip subluxation, sequela 02/15/2022   Asthma 11/11/2021   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   Finger pain, right 07/15/2021   Knee pain, left 04/25/2019   Bicipital tendonitis of left shoulder 04/25/2019   Varicose veins of bilateral lower extremities with other complications 10/25/2018   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   POTS (postural orthostatic tachycardia syndrome) 08/29/2017   Chronic pain syndrome 08/29/2017   Chronic insomnia 08/29/2017   Wrist pain, acute 06/02/2016   Enthesopathy of right shoulder 01/28/2016   Right shoulder pain 01/28/2016   Family history of early CAD  11/30/2015   Cervical disc disorder with radiculopathy of cervical region 05/13/2014   Chronic right SI joint pain 08/28/2013   Hip pain, right 06/25/2013   Elbow pain, right 02/13/2013   Bunionette 01/07/2013   Ehlers-Danlos disease 11/08/2012   Unspecified visual disturbance 04/19/2012   Pituitary adenoma with extrasellar extension (HCC) 04/19/2012    PCP: Theophilus Andrews, Tully MD   REFERRING PROVIDER: Harvey Seltzer MD   REFERRING DIAG: Mertha Bullock Syndrome, Cervical disc disorder with radiculopathy  Rationale for Evaluation and Treatment: Rehabilitation  THERAPY DIAG:  Ehlers-Danlos syndrome  Radiculopathy, cervical region  ONSET DATE: Chronic with exacerbation  SUBJECTIVE:  SUBJECTIVE STATEMENT: Saw Dr. Harvey and he injected my SIJ.  He said weaning off the estrogen may have been better than taking it off suddenly.   Return of elbow sx when elbow extended.  Gripping bothers it again.  Shooting nerve pain.  Pulling with wrist flexion.    This patient presents for physical therapy evaluation for hypermobile Ehlers-Danlos syndrome with complex history and multisystem involvement. Most recently she has had cervical radiculopathy on the Rt side.  Pain became severe in April, noticed pain in Rt elbow as well.  The sx were a bit different than previous L sided pain from similar issue. She had difficulty gripping due to pain.  Her usual ways to manage her sx  did not help.  Saw Dr. Harvey. She had shockwave.  Finally she had an MRI (see below) . Over the years she has tried many things for her pain-  including private Pilates sessions, soft tissue work/massage , dry needling all without lasting relief.  Rt elbow pain may be a separate issue. It is hard to get comfortable at night.  She went to  [facility] for a cervical injection which she reports 5 or 6 days of relief.  At that time she reports it was 50% improved but the initial benefit is gone.  The doctor wants her to try PT because she so unstable before doing another injection.  She reports the doctor does not think that another injection be harmful but she will not likely get full relief. Due to the severe pain she reports she has become more deconditioned overall but this is also due to other GI issues and mast cell activation.     PERTINENT HISTORY:  Cervical fusion L4-5 2016. POTS dysautonomia, spontaneous rupture of pectoralis right side Multijoint pain Ehlers-Danlos syndrome Chronic pain GI issues   Mast cell hypersensitivity and skin issues       Relevant historical information:     PAIN:  Are you having pain? Yes: NPRS scale: 3/10 Pain location: Rt arm  Pain description: Radiating, tightness, elbow is sharp and localized  Aggravating factors: Activity? Relieving factors: Valium  helpful for muscle relaxer, ice/heat  NO KT TAPE!! , Lidoacaine patch but does have skin reactions as a result  Rt hip pain is pretty bad right now, 6/10-7/10   PRECAUTIONS: Other: instability  ,  NO KT TAPE!! , Lidoacaine patch but does have skin reactions as a result  RED FLAGS: None    WEIGHT BEARING RESTRICTIONS: No  FALLS:  Has patient fallen in last 6 months? No  LIVING ENVIRONMENT: Lives with: lives with their family and lives alone Lives in: House/apartment Stairs: NT  Has following equipment at home: Patient has multiple braces including a soft collar and a hard collar for her neck  OCCUPATION: Works as a ESTATE MANAGER/LAND AGENT- in [employer]   PLOF: Independent  PATIENT GOALS: Pain relief   NEXT MD VISIT: as needed   OBJECTIVE:  Note: Objective measures were completed at Evaluation unless otherwise noted.  DIAGNOSTIC FINDINGS:  MRI 07/22/23: IMPRESSION: 1. Prior ACDF at C5-6 without  residual or recurrent stenosis. 2. Right paracentral disc protrusion at C6-7 without significant stenosis. The ventral right C7 nerve root could be affected. 3. Mild disc bulge with uncovertebral spurring at C4-5 with resultant mild right C5 foraminal stenosis.  PATIENT SURVEYS:  NDI:  Total 28/50    Minimum Detectable Change (90% confidence): 5 points or 10% points  COGNITION: Overall cognitive status: Within functional  limits for tasks assessed     SENSATION: Squeezing sensation but not numb or tingling    POSTURE: pt with overcorrected posture, decreased lordosis , tonic upper traps   PALPATION: Rt upper trap tension, knotted throughout   CERVICAL ROM:  Cervical Fusion 4-5: 9 yrs ago   Active ROM A/PROM (deg) 01/03/2024 AROM 12/06/23  Flexion 70 70  Extension 70 P 70  Right lateral flexion 58 60+  Left lateral flexion 60 60+  Right rotation Csf - [place] WFL, hyper   Left rotation WFL  WFL, hyper   (Blank rows = not tested)  UE ROM: WFL   UE MMT: 4+/5 with pain on Rt UE  12/06/23: WFL in shoulder flex/abd 4+/5         Grip strength 43# 34#   (Blank rows = not tested)  CERVICAL SPECIAL TESTS:  Neck flexor muscle endurance test: 12 sec   FUNCTIONAL TESTS:  NT   GAIT:  TREATMENT DATE:  [facility] Adult PT Treatment:                                                DATE: 01/03/24 Therapeutic Activity: Standing core press with physio ball facing forward and then lateral  Standing at wall with physio ball  Serratus activation ER isometric press out Flexion 0-90 deg looped band  Ball bridges with ball  x 10 knees bent Hold ball bridge with upper body horizontal pull red  Manual Soft tissue work to bilateral posterior and lateral cervicals Gentle suboccipital release  [facility] Adult PT Treatment:                                                DATE: 12/26/23 Therapeutic Exercise: LTR with head rotation Yellow looped band: ER, added chest press and overhead  Sidelying  IR with ball between knees Clam  TFL stretch, brief  Therapeutic Activity: Mod quadruped Cat/camel Neutral spine  Alt arm reaches  Hip extension  Manual: prone and sidelying Rt hip/pelvis, soft tissue to Rt glute and TFL, elongation stretch on L side.    [facility] Adult PT Treatment:                                                DATE: 12/06/23 Therapeutic Exercise: Recumbent bike 6 min L1 for conditioning  Manual Therapy: Gentle suboccipital release  facilitated chin tuck Therapeutic Activity: Standing green Thera-Band row and extension , modified to red band for improved shoulder position  Standing looped yellow band for postural exercises and shoulder stability  Supine chest pull  Bridging  x 10  Added horiz abd yellow band pull to iso bridge  Self care:  Discussion about goals plan of care and upcoming trip to [place] which will require her to travel, walk, carry and lift items.  She is understandably quite anxious and uneasy about it and she has to work 2 days after she returns and there are a lot of things out of her control while on the trip.      PATIENT EDUCATION:  Education details: See above posture, alignment, neutral zone and  joint protection PNE/Explain pain   Joint inflammation/collagen/ligament laxity, muscular support Stretching vs stabilizing  Person educated: Patient Education method: Explanation, Demonstration, and Verbal cues Education comprehension: verbalized understanding and needs further education   HOME EXERCISE PROGRAM:  Access Code: [email or web address] URL: [email or web address] Date: 11/16/2023 Prepared by: Delon Norma  Exercises - Standing Isometric Cervical Sidebending with Manual Resistance  - 1 x daily - 7 x weekly - 2 sets - 10 reps - 5 hold - Standing Isometric Cervical Flexion with Manual Resistance  - 1 x daily - 7 x weekly - 1-2 sets - 10 reps - 5 hold - Seated Isometric Cervical Rotation  - 1 x daily - 7 x weekly - 1-2 sets -  10 reps - 5 hold - Standing Isometric Cervical Extension with Manual Resistance  - 1 x daily - 7 x weekly - 1-2 sets - 10 reps - 5 hold - Seated Cervical Rotation AROM  - 2-3 x daily - 7 x weekly - 1 sets - 30 hold - Standing Shoulder External Rotation with Resistance  - 1 x daily - 7 x weekly - 2 sets - 10 reps - 5 hold - Shoulder Flexion Serratus Activation with Resistance  - 1 x daily - 7 x weekly - 2 sets - 10 reps - 5 hold   ASSESSMENT:   CLINICAL IMPRESSION:  Patient with good relief of SIJ injection from Dr. Harvey.  MD is agreeable to [name] getting more maintenance PT given her multi-joint/system involvement. She si feeling a slight increase in the Rt UE symtpoms.  Able to work in standing for core and postural exercises today using the physioball, which she has at home.  Will ask for more time on next visit in addition to adding in the lower body/hip/SIJ.      Braylen has completed 7 physical therapy sessions which have included joint positioning postural stability and manual therapy.  She continues to have pain in her neck but much improvement in her right arm symptoms.  She notices her left shoulder is painful.  Recently she has noted increased global pain in her body not only upper but hips and legs as well.  Some of this she questions might just be from aging and a hypermobile body.  She may consider reaching out regarding medications to address this.  Patient would like to continue PT to address fatigue and deconditioning which may be related to her chronic issues.  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  When she has these all day events she notes 2 days after she is completely wiped out.  While this is part of her chronic health condition it is something that she could improve with gradual progression and symptom tracking.  She did improve her NDI score but only by 2 points (26/50).  She will continue to benefit from skilled  therapy to achieve her goals and a realistic but consistent manner.   Her progress has been gradual but she has been able to tolerate gentle strengthening exercises without excessive rebound pain.    OBJECTIVE IMPAIRMENTS: decreased activity tolerance, decreased endurance, decreased strength, increased fascial restrictions, impaired tone, impaired UE functional use, postural dysfunction, pain, and joint hypermobility.   ACTIVITY LIMITATIONS: carrying, lifting, sitting, standing, sleeping, bathing, dressing, reach over head, hygiene/grooming, and locomotion level  PARTICIPATION LIMITATIONS: cleaning, interpersonal relationship, shopping, community activity, and occupation  PERSONAL FACTORS: Past/current experiences, Time since onset of injury/illness/exacerbation, and 3+ comorbidities:  Genetic condition, MCAS, POTS and previous cervical fusion are also affecting patient's functional outcome.   REHAB and headaches: Good  CLINICAL DECISION MAKING: Unstable/unpredictable  EVALUATION COMPLEXITY: High   GOALS: Goals reviewed with patient? Yes  SHORT TERM GOALS= LONG TERM GOALS:  Target date: 12/21/2023   Patient will be independent with final HEP upon discharge from PT and report consistent benefit following exercise completion.    Baseline:  Goal status:ongoing  2.  Patient will be I with concepts of joint protection and stability as it pertains to joint hypermobility.  Baseline:  Goal status: ongoing   3.  Patient will be able to demonstrate proper posture and lifting techniques related to spine health and reduction of symptoms.   Baseline:  Goal status:ongoing   4.  Pt will be able to report Rt arm symptoms improved by 25%  Baseline:  Goal status:ongoing   5.  Pt will report Rt elbow pain reduced to mild to allow for improved sleep/rest.  Baseline:  Goal status: ongoing , more sporatic  6. Patient will be able to walk or use bike for 15 min without increased neck pain .     Baseline: consistent pain with any walking and this  frustrates her    Goal status: NEW    7.  Pt will be able to feel more confident in her positioning during HEP and activity as a proxy for proprioception.    Baseline: she commonly doubts shoulder position    Goal status: NEW  PLAN:  PT FREQUENCY: 1-2x/week  PT DURATION: 6 weeks  PLANNED INTERVENTIONS: 97164- PT Re-evaluation, 97750- Physical Performance Testing, 97110-Therapeutic exercises, 97530- Therapeutic activity, [id number]- Neuromuscular re-education, 97535- Self Care, [id number]- Manual therapy, Patient/Family education, Balance training, Cryotherapy, and Moist heat.  PLAN FOR NEXT SESSION: check goals, ask for more time/visits?  standing scap, posture, cervical isometrics, gentle shouder A/AROM, manual    Nixon Kolton, PT 01/03/2024, 12:06 PM   Delon Norma, PT 01/03/2024 12:06 PM Phone: [phone] Fax: [phone]

## 2024-01-12 NOTE — Progress Notes (Signed)
 [facility] Refill Coordination Note    Specialty Medication(s) to be Shipped:   CF/Pulmonary/Asthma: Xolair     Other medication(s) to be shipped: No additional medications requested for fill at this time    Specialty Medications not needed at this time: N/A     Natasha Copeland, DOB: 1965/03/23  Phone: [phone] (home)       All above HIPAA information was verified with patient.     Was a nurse, learning disability used for this call? No    Completed refill call assessment today to schedule patient's medication shipment from the [facility]  [phone]).  All relevant notes have been reviewed.     Specialty medication(s) and dose(s) confirmed: Regimen is correct and unchanged.   Changes to medications: Arohi reports no changes at this time.  Changes to insurance: No  New side effects reported not previously addressed with a pharmacist or physician: None reported  Questions for the pharmacist: No    Confirmed patient received a Conservation Officer, Historic Buildings and a Surveyor, Mining with first shipment. The patient will receive a drug information handout for each medication shipped and additional FDA Medication Guides as required.       DISEASE/MEDICATION-SPECIFIC INFORMATION        N/A    SPECIALTY MEDICATION ADHERENCE     Medication Adherence    Patient reported X missed doses in the last month: 0  Specialty Medication: XOLAIR  300 mg/2 mL syringe (omalizumab )  Patient is on additional specialty medications: No              Were doses missed due to medication being on hold? No      XOLAIR  300 mg/2 mL syringe (omalizumab ): 0 doses of medicine on hand       Specialty medication is an injection or given on a cycle: Yes, Next injection is scheduled for 12/22/23.    REFERRAL TO PHARMACIST     Referral to the pharmacist: Not needed      [facility]     Shipping address confirmed in Epic.     Cost and Payment: Patient has a $0 copay, payment information is not required.    Delivery Scheduled: Yes, Expected medication delivery date: 01/17/24.     Medication will be delivered via UPS to the prescription address in Epic WAM.    [facility]  Specialty Technician

## 2024-01-16 MED FILL — XOLAIR 300 MG/2 ML SUBCUTANEOUS SYRINGE: SUBCUTANEOUS | 28 days supply | Qty: 2 | Fill #1

## 2024-01-16 NOTE — Therapy (Addendum)
 " OUTPATIENT PHYSICAL THERAPY NOTE/RENEWAL  Patient Name: Marissa [name] MRN: [id number] DOB:06/23/1965, 58 y.o., female Today's Date: 01/17/2024  END OF SESSION:  PT End of Session - 01/17/24 0805     Visit Number 10    Number of Visits 15    Date for Recertification  01/31/24    Authorization Type BCBS    Authorization Time Period submitted 10/23, 7 approved, submitted on 12/06/23 for 8 more, approved 4 visits until 12/18 JP    Authorization - Visit Number 3    Authorization - Number of Visits 4    PT Start Time 0803    PT Stop Time 0845    PT Time Calculation (min) 42 min    Activity Tolerance Patient tolerated treatment well    Behavior During Therapy [facility] for tasks assessed/performed                 Past Medical History:  Diagnosis Date   Allergy    Anemia 10/24   Thought to be related to NSAID   Arthritis    Asthma    Ehlers-Danlos syndrome    GERD (gastroesophageal reflux disease)    History of pituitary tumor    Ocular migraine    Past Surgical History:  Procedure Laterality Date   KNEE SURGERY N/A 01/17/2010   SHOULDER SURGERY N/A 01/18/2007   SPINE SURGERY     C5-6 fusion   UTERINE FIBROID SURGERY N/A    Patient Active Problem List   Diagnosis Date Noted   Vitamin D  deficiency 06/20/2023   Radicular pain in right arm 06/20/2023   Subluxation 04/07/2022   Right hip subluxation, sequela 02/15/2022   Asthma 11/11/2021   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   Finger pain, right 07/15/2021   Knee pain, left 04/25/2019   Bicipital tendonitis of left shoulder 04/25/2019   Varicose veins of bilateral lower extremities with other complications 10/25/2018   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   POTS (postural orthostatic tachycardia syndrome) 08/29/2017   Chronic pain syndrome 08/29/2017   Chronic insomnia 08/29/2017   Wrist pain, acute 06/02/2016   Enthesopathy of right shoulder 01/28/2016   Right shoulder pain 01/28/2016   Family history of early CAD  11/30/2015   Cervical disc disorder with radiculopathy of cervical region 05/13/2014   Chronic right SI joint pain 08/28/2013   Hip pain, right 06/25/2013   Elbow pain, right 02/13/2013   Bunionette 01/07/2013   Ehlers-Danlos disease 11/08/2012   Unspecified visual disturbance 04/19/2012   Pituitary adenoma with extrasellar extension (HCC) 04/19/2012    PCP: Theophilus Andrews, Tully MD   REFERRING PROVIDER: Harvey Seltzer MD   REFERRING DIAG: Mertha Bullock Syndrome, Cervical disc disorder with radiculopathy  Rationale for Evaluation and Treatment: Rehabilitation  THERAPY DIAG:  Ehlers-Danlos syndrome  Radiculopathy, cervical region  ONSET DATE: Chronic with exacerbation  SUBJECTIVE:  SUBJECTIVE STATEMENT: Getting more pain in neck and headache pain which seems to be related to the neck. Return of L scapular pain.  Overall her body does feel  cranked up, including the right hip SI joint and other mentioned body parts.  She is just generally sore and achy and stiff feeling.  She still wonders if this has something to do with her stopping estrogen despite being told it probably was not.  Her work schedule has plateaued a bit is not like she has been overly stressed and busy.  She would like to continue therapy as it has helped reduce some of the tension she feels in her neck.  Rt elbow  Head neck pain 6/10-7/10 L scap 7/10   This patient presents for physical therapy evaluation for hypermobile Ehlers-Danlos syndrome with complex history and multisystem involvement. Most recently she has had cervical radiculopathy on the Rt side.  Pain became severe in April, noticed pain in Rt elbow as well.  The sx were a bit different than previous L sided pain from similar issue. She had difficulty gripping due to  pain.  Her usual ways to manage her sx  did not help.  Saw Dr. Harvey. She had shockwave.  Finally she had an MRI (see below) . Over the years she has tried many things for her pain-  including private Pilates sessions, soft tissue work/massage , dry needling all without lasting relief.  Rt elbow pain may be a separate issue. It is hard to get comfortable at night.  She went to [facility] for a cervical injection which she reports 5 or 6 days of relief.  At that time she reports it was 50% improved but the initial benefit is gone.  The doctor wants her to try PT because she so unstable before doing another injection.  She reports the doctor does not think that another injection be harmful but she will not likely get full relief. Due to the severe pain she reports she has become more deconditioned overall but this is also due to other GI issues and mast cell activation.     PERTINENT HISTORY:  Cervical fusion L4-5 2016. POTS dysautonomia, spontaneous rupture of pectoralis right side Multijoint pain Ehlers-Danlos syndrome Chronic pain GI issues   Mast cell hypersensitivity and skin issues       Relevant historical information:     PAIN:  Are you having pain? Yes: NPRS scale: 3/10 Pain location: Rt arm  Pain description: Radiating, tightness, elbow is sharp and localized  Aggravating factors: Activity? Relieving factors: Valium  helpful for muscle relaxer, ice/heat  NO KT TAPE!! , Lidoacaine patch but does have skin reactions as a result  Rt hip pain is pretty bad right now, 6/10-7/10   PRECAUTIONS: Other: instability ,  NO KT TAPE!! , Lidoacaine patch but does have skin reactions as a result  RED FLAGS: None    WEIGHT BEARING RESTRICTIONS: No  FALLS:  Has patient fallen in last 6 months? No  LIVING ENVIRONMENT: Lives with: lives with their family and lives alone Lives in: House/apartment Stairs: NT  Has following equipment at home: Patient has multiple braces  including a soft collar and a hard collar for her neck  OCCUPATION: Works as a ESTATE MANAGER/LAND AGENT- in [employer]   PLOF: Independent  PATIENT GOALS: Pain relief   NEXT MD VISIT: as needed   OBJECTIVE:  Note: Objective measures were completed at Evaluation unless otherwise noted.  DIAGNOSTIC FINDINGS:  MRI 07/22/23:  IMPRESSION: 1. Prior ACDF at C5-6 without residual or recurrent stenosis. 2. Right paracentral disc protrusion at C6-7 without significant stenosis. The ventral right C7 nerve root could be affected. 3. Mild disc bulge with uncovertebral spurring at C4-5 with resultant mild right C5 foraminal stenosis.  PATIENT SURVEYS:  NDI:  Total 28/50    01/17/24: 22/50    Minimum Detectable Change (90% confidence): 5 points or 10% points  COGNITION: Overall cognitive status: Within functional limits for tasks assessed     SENSATION: Squeezing sensation but not numb or tingling    POSTURE: pt with overcorrected posture, decreased lordosis , tonic upper traps   PALPATION: Rt upper trap tension, knotted throughout   CERVICAL ROM:  Cervical Fusion 4-5: 9 yrs ago   Active ROM A/PROM (deg) 01/17/2024 AROM 12/06/23  Flexion 70 70  Extension 70 P 70  Right lateral flexion 58 60+  Left lateral flexion 60 60+  Right rotation [facility] WFL, hyper   Left rotation WFL  WFL, hyper   (Blank rows = not tested)  UE ROM: WFL   UE MMT: 4+/5 with pain on Rt UE  12/06/23: WFL in shoulder flex/abd 4+/5   01/17/24: Strength of her upper body and range of motion of her cervical spine continue to be at a baseline.  Cervical spine shows hypermobility as outlined above .  A lack of strength is not the cause of her issues.       Grip strength 43# 34#   (Blank rows = not tested)  CERVICAL SPECIAL TESTS:  Neck flexor muscle endurance test: 12 sec   FUNCTIONAL TESTS:  NT   GAIT:  TREATMENT DATE:   [facility] Adult PT Treatment:                                                 DATE: 01/17/24  Manual Therapy: Soft tissue work to bilateral posterior and lateral cervicals Gentle suboccipital release Therapeutic Activity: Patient made comfortable with soft pilates ball under her neck  Footwork  Double leg Parallel heels, toes narrow and wide Pilates V heels and toes narrow and wide  2 red 1 blue 1 Yellow Single leg  2 red 1 blue  Double leg heel raise and calf stretch, prancing  Supine arm arcs 1 red in abduction and in flexion x 10 each    [facility] Adult PT Treatment:                                                DATE: 01/03/24 Therapeutic Activity: Standing core press with physio ball facing forward and then lateral  Standing at wall with physio ball  Serratus activation ER isometric press out Flexion 0-90 deg looped band  Ball bridges with ball  x 10 knees bent Hold ball bridge with upper body horizontal pull red  Manual Soft tissue work to bilateral posterior and lateral cervicals Gentle suboccipital release  [facility] Adult PT Treatment:                                                DATE: 12/26/23 Therapeutic  Exercise: LTR with head rotation Yellow looped band: ER, added chest press and overhead  Sidelying IR with ball between knees Clam  TFL stretch, brief  Therapeutic Activity: Mod quadruped Cat/camel Neutral spine  Alt arm reaches  Hip extension  Manual: prone and sidelying Rt hip/pelvis, soft tissue to Rt glute and TFL, elongation stretch on L side.    [facility] Adult PT Treatment:                                                DATE: 12/06/23 Therapeutic Exercise: Recumbent bike 6 min L1 for conditioning  Manual Therapy: Gentle suboccipital release  facilitated chin tuck Therapeutic Activity: Standing green Thera-Band row and extension , modified to red band for improved shoulder position  Standing looped yellow band for postural exercises and shoulder stability  Supine chest pull  Bridging  x 10  Added horiz abd yellow band pull to iso  bridge  Self care:  Discussion about goals plan of care and upcoming trip to [place] which will require her to travel, walk, carry and lift items.  She is understandably quite anxious and uneasy about it and she has to work 2 days after she returns and there are a lot of things out of her control while on the trip.      PATIENT EDUCATION:  Education details: See above posture, alignment, neutral zone and joint protection PNE/Explain pain   Joint inflammation/collagen/ligament laxity, muscular support Stretching vs stabilizing  Person educated: Patient Education method: Explanation, Demonstration, and Verbal cues Education comprehension: verbalized understanding and needs further education   HOME EXERCISE PROGRAM:  Access Code: [email or web address] URL: [email or web address] Date: 11/16/2023 Prepared by: Delon Norma  Exercises - Standing Isometric Cervical Sidebending with Manual Resistance  - 1 x daily - 7 x weekly - 2 sets - 10 reps - 5 hold - Standing Isometric Cervical Flexion with Manual Resistance  - 1 x daily - 7 x weekly - 1-2 sets - 10 reps - 5 hold - Seated Isometric Cervical Rotation  - 1 x daily - 7 x weekly - 1-2 sets - 10 reps - 5 hold - Standing Isometric Cervical Extension with Manual Resistance  - 1 x daily - 7 x weekly - 1-2 sets - 10 reps - 5 hold - Seated Cervical Rotation AROM  - 2-3 x daily - 7 x weekly - 1 sets - 30 hold - Standing Shoulder External Rotation with Resistance  - 1 x daily - 7 x weekly - 2 sets - 10 reps - 5 hold - Shoulder Flexion Serratus Activation with Resistance  - 1 x daily - 7 x weekly - 2 sets - 10 reps - 5 hold   ASSESSMENT:   CLINICAL IMPRESSION: 01/17/24: Patient continues to be limited due to her genetic condition hypermobile Ehlers-Danlos as well as comorbidities.  Recent has noticed improved ability to work in standing but as time goes on between appointments her pain and physical limitations worsen.  She suffers with  chronic pain in her neck,upper back, shoulders, elbow, hip and SI joint. The tension she feels throughout her body is concerning for her and even though she is hypermobile and unstable in some of these joints her nervous system is giving her signals that it is unsafe and as a result tightens to protect the area. She will need a significant amount of physical  therapy to address her multi factorial and multijoint issues.  Goals are in progress but she has seen an improvement albeit short lived.  Today's session we tried to do some lower body/ core stability work mostly to calm her nervous system and reduce the focus of attention on her cervical spine.   Add: Her NDI score has improved from 28/50 to 22/50. There was a gap in scheduling due to the patient taking a trip to [place] for a fmaily event.  Travel is very taxing for her given her significant chronic fatigue, Ehlers-Danlos syndrome and other comorbidities.  Ramatoulaye's progress is not going to be linear as her condition varies so greatly.  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 We are seeing her for mostly her neck and arm pain but she does suffer from widespread chronic pain instability as well.   01/03/24: Patient with good relief of SIJ injection from Dr. Harvey.  MD is agreeable to Gracelin getting more maintenance PT given her multi-joint/system involvement. She si feeling a slight increase in the Rt UE symtpoms.  Able to work in standing for core and postural exercises today using the physioball, which she has at home.  Will ask for more time on next visit in addition to adding in the lower body/hip/SIJ.      Joury has completed 7 physical therapy sessions which have included joint positioning postural stability and manual therapy.  She continues to have pain in her  neck but much improvement in her right arm symptoms.  She notices her left shoulder is painful.  Recently she has noted increased global pain in her body not only upper but hips and legs as well.  Some of this she questions might just be from aging and a hypermobile body.  She may consider reaching out regarding medications to address this.  Patient would like to continue PT to address fatigue and deconditioning which may be related to her chronic issues.  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  When she has these all day events she notes 2 days after she is completely wiped out.  While this is part of her chronic health condition it is something that she could improve with gradual progression and symptom tracking.  She did improve her NDI score but only by 2 points (26/50).  She will continue to benefit from skilled therapy to achieve her goals and a realistic but consistent manner.   Her progress has been gradual but she has been able to tolerate gentle strengthening exercises without excessive rebound pain.    OBJECTIVE IMPAIRMENTS: decreased activity tolerance, decreased endurance, decreased strength, increased fascial restrictions, impaired tone, impaired UE functional use, postural dysfunction, pain, and joint hypermobility.   ACTIVITY LIMITATIONS: carrying, lifting, sitting, standing, sleeping, bathing, dressing, reach over head, hygiene/grooming, and locomotion level  PARTICIPATION LIMITATIONS: cleaning, interpersonal relationship, shopping, community activity, and occupation  PERSONAL FACTORS: Past/current experiences, Time since onset of injury/illness/exacerbation, and 3+ comorbidities: Genetic condition, MCAS, POTS and previous cervical fusion are also affecting patient's functional outcome.   REHAB and headaches: Good  CLINICAL DECISION MAKING: Unstable/unpredictable  EVALUATION COMPLEXITY: High   GOALS: Goals reviewed with  patient? Yes  SHORT TERM GOALS= LONG TERM GOALS:  Target date: 12/21/2023  Patient will be independent with final HEP upon discharge from PT and report consistent benefit following exercise completion.    Baseline: She is up to date on this but it is always a work in progress Goal status:ongoing  2.  Patient will be I with concepts of joint protection and stability as it pertains to joint hypermobility.  Baseline: Her understanding has improved with this Goal status: ongoing   3.  Patient will be able to demonstrate proper posture and lifting techniques related to spine health and reduction of symptoms.   Baseline: We have been unable to practice more than light lifting Goal status:ongoing   4.  Pt will be able to report Rt arm symptoms improved by 25%  Baseline: This goal varies greatly Goal status:ongoing   5.  Pt will report Rt elbow pain reduced to mild to allow for improved sleep/rest.  Baseline: This is improving and is more sporadic now Goal status: ongoing , more sporatic  6. Patient will be able to walk or use bike for 15 min without increased neck pain .    Baseline: consistent pain with any walking and this  frustrates her    Goal status: NEW    7.  Pt will be able to feel more confident in her positioning during HEP and activity as a proxy for proprioception.    Baseline: she commonly doubts shoulder position    Goal status: NEW  PLAN:  PT FREQUENCY: 1-2x/week  PT DURATION: 6 weeks  PLANNED INTERVENTIONS: 97164- PT Re-evaluation, 97750- Physical Performance Testing, 97110-Therapeutic exercises, 97530- Therapeutic activity, [id number]- Neuromuscular re-education, 97535- Self Care, 02859- Manual therapy, Patient/Family education, Balance training, Cryotherapy, and Moist heat.  PLAN FOR NEXT SESSION:  standing scap, posture, cervical isometrics, gentle shouder A/AROM, manual    Nikos Anglemyer, PT 01/17/2024, 11:39 AM   Delon Norma, PT 01/17/2024 11:39 AM Phone:  [phone] Fax: [phone]  "

## 2024-01-17 ENCOUNTER — Encounter: Payer: Self-pay | Admitting: Physical Therapy

## 2024-01-17 ENCOUNTER — Ambulatory Visit (INDEPENDENT_AMBULATORY_CARE_PROVIDER_SITE_OTHER): Admitting: Physical Therapy

## 2024-01-17 DIAGNOSIS — M5412 Radiculopathy, cervical region: Secondary | ICD-10-CM

## 2024-01-17 DIAGNOSIS — Q796 Ehlers-Danlos syndrome, unspecified: Secondary | ICD-10-CM | POA: Diagnosis not present

## 2024-01-24 ENCOUNTER — Encounter: Payer: Self-pay | Admitting: Physical Therapy

## 2024-02-06 ENCOUNTER — Ambulatory Visit: Admitting: Sports Medicine

## 2024-02-07 ENCOUNTER — Encounter: Admitting: Physical Therapy

## 2024-02-13 NOTE — Progress Notes (Signed)
 [facility] Refill Coordination Note    Specialty Medication(s) to be Shipped:   CF/Pulmonary/Asthma: Xolair     Other medication(s) to be shipped: No additional medications requested for fill at this time    Specialty Medications not needed at this time: N/A     Natasha Copeland, DOB: 02-21-1965  Phone: There are no phone numbers on file.      All above HIPAA information was verified with patient.     Was a nurse, learning disability used for this call? No    Completed refill call assessment today to schedule patient's medication shipment from the [facility]  [phone]).  All relevant notes have been reviewed.     Specialty medication(s) and dose(s) confirmed: Regimen is correct and unchanged.   Changes to medications: Natasha Copeland reports no changes at this time.  Changes to insurance: No  New side effects reported not previously addressed with a pharmacist or physician: None reported  Questions for the pharmacist: No    Confirmed patient received a Conservation Officer, Historic Buildings and a Surveyor, Mining with first shipment. The patient will receive a drug information handout for each medication shipped and additional FDA Medication Guides as required.       DISEASE/MEDICATION-SPECIFIC INFORMATION        N/A    SPECIALTY MEDICATION ADHERENCE     Medication Adherence    Patient reported X missed doses in the last month: 0  Specialty Medication: omalizumab : XOLAIR  300 mg/2 mL syringe  Patient is on additional specialty medications: No  Patient is on more than two specialty medications: No  Any gaps in refill history greater than 2 weeks in the last 3 months: no  Demonstrates understanding of importance of adherence: yes  Informant: patient  Reliability of informant: reliable  Provider-estimated medication adherence level: good  Patient is at risk for Non-Adherence: No  Reasons for non-adherence: no problems identified  Confirmed plan for next specialty medication refill: delivery by pharmacy  Refills needed for supportive medications: not needed          Refill Coordination    Has the Patients' Contact Information Changed: No  Is the Shipping Address Different: No         Were doses missed due to medication being on hold? No    Xolair   300/2 mg/ml: 0 doses of medicine on hand         Specialty medication is an injection or given on a cycle: Yes, Next injection is scheduled for 02/19/24.    REFERRAL TO PHARMACIST     Referral to the pharmacist: Not needed      [facility]     Shipping address confirmed in Epic.     Cost and Payment: Patient has a $0 copay, payment information is not required.    Delivery Scheduled: Yes, Expected medication delivery date: 02/15/24.     Medication will be delivered via UPS to the prescription address in Epic WAM.    [facility]  Specialty Technician

## 2024-02-14 ENCOUNTER — Encounter: Admitting: Physical Therapy

## 2024-02-14 MED FILL — XOLAIR 300 MG/2 ML SUBCUTANEOUS SYRINGE: SUBCUTANEOUS | 28 days supply | Qty: 2 | Fill #2

## 2024-02-21 ENCOUNTER — Encounter: Admitting: Physical Therapy

## 2024-02-28 ENCOUNTER — Encounter: Admitting: Physical Therapy

## 2024-02-29 ENCOUNTER — Ambulatory Visit: Admitting: Sports Medicine

## 2024-04-24 ENCOUNTER — Ambulatory Visit: Admitting: Cardiology

## 2024-06-19 ENCOUNTER — Encounter: Admitting: Internal Medicine
